# Patient Record
Sex: Female | Born: 1955 | Race: White | Hispanic: No | Smoking: Former smoker
Health system: Southern US, Community
[De-identification: ages and names within clinical notes are randomized; demographics above are authoritative.]

## PROBLEM LIST (undated history)

## (undated) DIAGNOSIS — I10 Essential (primary) hypertension: Secondary | ICD-10-CM

## (undated) DIAGNOSIS — I639 Cerebral infarction, unspecified: Secondary | ICD-10-CM

## (undated) DIAGNOSIS — I739 Peripheral vascular disease, unspecified: Secondary | ICD-10-CM

## (undated) DIAGNOSIS — R600 Localized edema: Secondary | ICD-10-CM

## (undated) DIAGNOSIS — I5032 Chronic diastolic (congestive) heart failure: Secondary | ICD-10-CM

## (undated) DIAGNOSIS — I4892 Unspecified atrial flutter: Secondary | ICD-10-CM

## (undated) DIAGNOSIS — R0600 Dyspnea, unspecified: Secondary | ICD-10-CM

## (undated) DIAGNOSIS — E119 Type 2 diabetes mellitus without complications: Secondary | ICD-10-CM

## (undated) DIAGNOSIS — I493 Ventricular premature depolarization: Secondary | ICD-10-CM

## (undated) DIAGNOSIS — R0609 Other forms of dyspnea: Secondary | ICD-10-CM

## (undated) DIAGNOSIS — C50919 Malignant neoplasm of unspecified site of unspecified female breast: Secondary | ICD-10-CM

## (undated) HISTORY — PX: LEG SURGERY: SHX1003

## (undated) HISTORY — PX: OTHER SURGICAL HISTORY: SHX169

## (undated) HISTORY — PX: MASTECTOMY: SHX3

## (undated) HISTORY — DX: Chronic diastolic (congestive) heart failure: I50.32

---

## 2009-09-04 ENCOUNTER — Ambulatory Visit: Payer: Self-pay | Admitting: Family Medicine

## 2011-05-02 ENCOUNTER — Inpatient Hospital Stay: Payer: Self-pay | Admitting: *Deleted

## 2014-04-04 ENCOUNTER — Other Ambulatory Visit: Payer: Self-pay | Admitting: Family Medicine

## 2014-04-04 ENCOUNTER — Ambulatory Visit: Payer: Self-pay | Admitting: Ophthalmology

## 2014-04-04 LAB — COMPREHENSIVE METABOLIC PANEL
ALBUMIN: 3.3 g/dL — AB (ref 3.4–5.0)
ANION GAP: 9 (ref 7–16)
Alkaline Phosphatase: 79 U/L
BUN: 32 mg/dL — AB (ref 7–18)
Bilirubin,Total: 0.3 mg/dL (ref 0.2–1.0)
Calcium, Total: 9.7 mg/dL (ref 8.5–10.1)
Chloride: 101 mmol/L (ref 98–107)
Co2: 26 mmol/L (ref 21–32)
Creatinine: 1.17 mg/dL (ref 0.60–1.30)
EGFR (Non-African Amer.): 51 — ABNORMAL LOW
GFR CALC AF AMER: 59 — AB
Glucose: 223 mg/dL — ABNORMAL HIGH (ref 65–99)
Osmolality: 286 (ref 275–301)
POTASSIUM: 4.3 mmol/L (ref 3.5–5.1)
SGOT(AST): 22 U/L (ref 15–37)
SGPT (ALT): 23 U/L
Sodium: 136 mmol/L (ref 136–145)
Total Protein: 8.2 g/dL (ref 6.4–8.2)

## 2014-04-04 LAB — HEMOGLOBIN A1C: HEMOGLOBIN A1C: 9.7 % — AB (ref 4.2–6.3)

## 2014-04-04 LAB — LIPID PANEL
CHOLESTEROL: 171 mg/dL (ref 0–200)
HDL Cholesterol: 31 mg/dL — ABNORMAL LOW (ref 40–60)
Ldl Cholesterol, Calc: 70 mg/dL (ref 0–100)
Triglycerides: 351 mg/dL — ABNORMAL HIGH (ref 0–200)
VLDL CHOLESTEROL, CALC: 70 mg/dL — AB (ref 5–40)

## 2014-04-04 LAB — TSH: Thyroid Stimulating Horm: 2.38 u[IU]/mL

## 2014-04-16 ENCOUNTER — Ambulatory Visit: Payer: Self-pay | Admitting: Ophthalmology

## 2014-12-21 NOTE — Op Note (Signed)
PATIENT NAME:  Megan Cisneros, Megan Cisneros MR#:  [id number] DATE OF BIRTH:  01-28-1956  DATE OF PROCEDURE:  04/16/2014  PREOPERATIVE DIAGNOSIS: Visually significant cataract of the right eye.   POSTOPERATIVE DIAGNOSIS: Visually significant cataract of the right eye.   OPERATIVE PROCEDURE: Cataract extraction by phacoemulsification with implant of intraocular lens to right eye.   SURGEON: Birder Robson, MD.   ANESTHESIA:  1. Managed anesthesia care.  2. Topical tetracaine drops followed by 2% Xylocaine jelly applied in the preoperative holding area.   COMPLICATIONS: None.   TECHNIQUE:  Stop and chop.  DESCRIPTION OF PROCEDURE: The patient was examined and consented in the preoperative holding area where the aforementioned topical anesthesia was applied to the right eye and then brought back to the Operating Room where the right eye was prepped and draped in the usual sterile ophthalmic fashion and a lid speculum was placed. A paracentesis was created with the side port blade and the anterior chamber was filled with viscoelastic. A near clear corneal incision was performed with the steel keratome. A continuous curvilinear capsulorrhexis was performed with a cystotome followed by the capsulorrhexis forceps. Hydrodissection and hydrodelineation were carried out with BSS on a blunt cannula. The lens was removed in a stop-and-chop technique and the remaining cortical material was removed with the irrigation-aspiration handpiece. The capsular bag was inflated with viscoelastic and the Tecnis ZCB00 20.5-diopter lens, serial number [id number] was placed in the capsular bag without complication. The remaining viscoelastic was removed from the eye with the irrigation-aspiration handpiece. The wounds were hydrated. The anterior chamber was flushed with Miostat and the eye was inflated to physiologic pressure. 0.1 mL of cefuroxime concentration 10 mg/mL was placed in the anterior chamber. The wounds were found to be  water tight. The eye was dressed with Vigamox. The patient was given protective glasses to wear throughout the day and a shield with which to sleep tonight. The patient was also given drops with which to begin a drop regimen today and will follow-up with me in one day.    ____________________________ Livingston Diones. Marquet Faircloth, MD wlp:TT D: 04/16/2014 20:32:54 ET T: 04/16/2014 21:45:41 ET JOB#: [id number]  cc: Quaran Kedzierski L. Shaconda Hajduk, MD, <Dictator> Livingston Diones Santiago Graf MD ELECTRONICALLY SIGNED 04/17/2014 8:51

## 2015-12-11 ENCOUNTER — Emergency Department: Payer: Medicare Other

## 2015-12-11 ENCOUNTER — Encounter: Payer: Self-pay | Admitting: Emergency Medicine

## 2015-12-11 ENCOUNTER — Inpatient Hospital Stay
Admission: EM | Admit: 2015-12-11 | Discharge: 2015-12-13 | DRG: 292 | Disposition: A | Discharge status: Home Health | Payer: Medicare Other | Attending: Internal Medicine | Admitting: Internal Medicine

## 2015-12-11 DIAGNOSIS — Z7901 Long term (current) use of anticoagulants: Secondary | ICD-10-CM | POA: Diagnosis not present

## 2015-12-11 DIAGNOSIS — E1165 Type 2 diabetes mellitus with hyperglycemia: Secondary | ICD-10-CM | POA: Diagnosis present

## 2015-12-11 DIAGNOSIS — G4730 Sleep apnea, unspecified: Secondary | ICD-10-CM | POA: Diagnosis present

## 2015-12-11 DIAGNOSIS — Z8673 Personal history of transient ischemic attack (TIA), and cerebral infarction without residual deficits: Secondary | ICD-10-CM

## 2015-12-11 DIAGNOSIS — R600 Localized edema: Secondary | ICD-10-CM | POA: Insufficient documentation

## 2015-12-11 DIAGNOSIS — Z6841 Body Mass Index (BMI) 40.0 and over, adult: Secondary | ICD-10-CM

## 2015-12-11 DIAGNOSIS — I4891 Unspecified atrial fibrillation: Secondary | ICD-10-CM | POA: Diagnosis present

## 2015-12-11 DIAGNOSIS — Z9119 Patient's noncompliance with other medical treatment and regimen: Secondary | ICD-10-CM

## 2015-12-11 DIAGNOSIS — I509 Heart failure, unspecified: Secondary | ICD-10-CM | POA: Diagnosis not present

## 2015-12-11 DIAGNOSIS — Z87891 Personal history of nicotine dependence: Secondary | ICD-10-CM | POA: Diagnosis not present

## 2015-12-11 DIAGNOSIS — I739 Peripheral vascular disease, unspecified: Secondary | ICD-10-CM | POA: Diagnosis present

## 2015-12-11 DIAGNOSIS — I5033 Acute on chronic diastolic (congestive) heart failure: Secondary | ICD-10-CM | POA: Diagnosis not present

## 2015-12-11 DIAGNOSIS — I110 Hypertensive heart disease with heart failure: Principal | ICD-10-CM | POA: Diagnosis present

## 2015-12-11 DIAGNOSIS — R0602 Shortness of breath: Secondary | ICD-10-CM

## 2015-12-11 DIAGNOSIS — I483 Typical atrial flutter: Secondary | ICD-10-CM | POA: Diagnosis not present

## 2015-12-11 DIAGNOSIS — I2510 Atherosclerotic heart disease of native coronary artery without angina pectoris: Secondary | ICD-10-CM | POA: Diagnosis present

## 2015-12-11 DIAGNOSIS — Z859 Personal history of malignant neoplasm, unspecified: Secondary | ICD-10-CM

## 2015-12-11 DIAGNOSIS — I4892 Unspecified atrial flutter: Secondary | ICD-10-CM | POA: Diagnosis present

## 2015-12-11 DIAGNOSIS — J449 Chronic obstructive pulmonary disease, unspecified: Secondary | ICD-10-CM | POA: Diagnosis present

## 2015-12-11 DIAGNOSIS — Z794 Long term (current) use of insulin: Secondary | ICD-10-CM

## 2015-12-11 HISTORY — DX: Morbid (severe) obesity due to excess calories: E66.01

## 2015-12-11 HISTORY — DX: Dyspnea, unspecified: R06.00

## 2015-12-11 HISTORY — DX: Type 2 diabetes mellitus without complications: E11.9

## 2015-12-11 HISTORY — DX: Cerebral infarction, unspecified: I63.9

## 2015-12-11 HISTORY — DX: Essential (primary) hypertension: I10

## 2015-12-11 HISTORY — DX: Unspecified atrial flutter: I48.92

## 2015-12-11 HISTORY — DX: Other forms of dyspnea: R06.09

## 2015-12-11 HISTORY — DX: Peripheral vascular disease, unspecified: I73.9

## 2015-12-11 HISTORY — DX: Ventricular premature depolarization: I49.3

## 2015-12-11 LAB — BRAIN NATRIURETIC PEPTIDE: B NATRIURETIC PEPTIDE 5: 362 pg/mL — AB (ref 0.0–100.0)

## 2015-12-11 LAB — CBC WITH DIFFERENTIAL/PLATELET
BASOS PCT: 1 %
Basophils Absolute: 0.1 10*3/uL (ref 0–0.1)
EOS ABS: 0.9 10*3/uL — AB (ref 0–0.7)
EOS PCT: 9 %
HEMATOCRIT: 39.5 % (ref 35.0–47.0)
Hemoglobin: 13.2 g/dL (ref 12.0–16.0)
Lymphocytes Relative: 25 %
Lymphs Abs: 2.5 10*3/uL (ref 1.0–3.6)
MCH: 28.4 pg (ref 26.0–34.0)
MCHC: 33.4 g/dL (ref 32.0–36.0)
MCV: 85 fL (ref 80.0–100.0)
MONO ABS: 0.8 10*3/uL (ref 0.2–0.9)
MONOS PCT: 8 %
NEUTROS ABS: 5.9 10*3/uL (ref 1.4–6.5)
Neutrophils Relative %: 57 %
PLATELETS: 305 10*3/uL (ref 150–440)
RBC: 4.64 MIL/uL (ref 3.80–5.20)
RDW: 16.7 % — AB (ref 11.5–14.5)
WBC: 10.1 10*3/uL (ref 3.6–11.0)

## 2015-12-11 LAB — BASIC METABOLIC PANEL
Anion gap: 8 (ref 5–15)
BUN: 39 mg/dL — ABNORMAL HIGH (ref 6–20)
CALCIUM: 9.2 mg/dL (ref 8.9–10.3)
CO2: 24 mmol/L (ref 22–32)
Chloride: 103 mmol/L (ref 101–111)
Creatinine, Ser: 1.03 mg/dL — ABNORMAL HIGH (ref 0.44–1.00)
GFR, EST NON AFRICAN AMERICAN: 57 mL/min — AB (ref >60)
GLUCOSE: 241 mg/dL — AB (ref 65–99)
Potassium: 4.1 mmol/L (ref 3.5–5.1)
Sodium: 135 mmol/L (ref 135–145)

## 2015-12-11 LAB — TROPONIN I: Troponin I: <0.03 ng/mL (ref <0.031)

## 2015-12-11 MED ORDER — ENOXAPARIN SODIUM 100 MG/ML ~~LOC~~ SOLN
1.0000 mg/kg | Freq: Two times a day (BID) | SUBCUTANEOUS | Status: DC
  Administered 2015-12-11 – 2015-12-12 (×2): 100 mg via SUBCUTANEOUS
  Filled 2015-12-11 (×2): qty 1

## 2015-12-11 MED ORDER — FUROSEMIDE 10 MG/ML IJ SOLN
40.0000 mg | Freq: Once | INTRAMUSCULAR | Status: AC
  Administered 2015-12-11: 40 mg via INTRAVENOUS
  Filled 2015-12-11: qty 4

## 2015-12-11 MED ORDER — LEVOFLOXACIN IN D5W 750 MG/150ML IV SOLN
750.0000 mg | Freq: Once | INTRAVENOUS | Status: DC
  Filled 2015-12-11: qty 150

## 2015-12-11 MED ORDER — ALBUTEROL SULFATE (2.5 MG/3ML) 0.083% IN NEBU
5.0000 mg | INHALATION_SOLUTION | Freq: Once | RESPIRATORY_TRACT | Status: AC
  Administered 2015-12-11: 5 mg via RESPIRATORY_TRACT
  Filled 2015-12-11: qty 6

## 2015-12-11 NOTE — ED Provider Notes (Signed)
[facility] Emergency Department Provider Note    ____________________________________________  Time seen: ~1900  I have reviewed the triage vital signs and the nursing notes.   HISTORY  Chief Complaint Respiratory Distress   History limited by: Not Limited   HPI Megan Cisneros is a 60 y.o. female who presents to the emergency department today because of concerns for shortness of breath. The patient states that it started 2 days ago. It has progressively gotten worse. The patient states it is worse when she lies flat. She has had a dry nonproductive cough associated with this. She denies any significant chest pain. She denies any fevers.    Past Medical History  Diagnosis Date  . Coronary artery disease   . Diabetes mellitus without complication ([place])   . Hypertension   . Cancer ([place])   . Stroke [facility])     There are no active problems to display for this patient.   Past Surgical History  Procedure Laterality Date  . Leg surgery    . Cesarean section      No current outpatient prescriptions on file.  Allergies Review of patient's allergies indicates no known allergies.  No family history on file.  Social History Social History  Substance Use Topics  . Smoking status: Former Research scientist (life sciences)  . Smokeless tobacco: Not on file  . Alcohol Use: No    Review of Systems  Constitutional: Negative for fever. Cardiovascular: Negative for chest pain. Respiratory:Positive for chest pain and cough Gastrointestinal: Negative for abdominal pain, vomiting and diarrhea. Neurological: Negative for headaches, focal weakness or numbness.  10-point ROS otherwise negative.  ____________________________________________   PHYSICAL EXAM:  VITAL SIGNS: ED Triage Vitals  Enc Vitals Group     BP 12/11/15 1820 157/70 mmHg     Pulse Rate 12/11/15 1820 64     Resp 12/11/15 1820 18     Temp 12/11/15 1820 98.2 F (36.8 C)     Temp Source 12/11/15 1820 Oral      SpO2 12/11/15 1820 94 %     Weight 12/11/15 1820 219 lb (99.338 kg)     Height 12/11/15 1820 5' (1.524 m)   Constitutional: Alert and oriented. Well appearing and in no distress. Eyes: Conjunctivae are normal. PERRL. Normal extraocular movements. ENT   Head: Normocephalic and atraumatic.   Nose: No congestion/rhinnorhea.   Mouth/Throat: Mucous membranes are moist.   Neck: No stridor. Hematological/Lymphatic/Immunilogical: No cervical lymphadenopathy. Cardiovascular: Normal rate, regular rhythm.  No murmurs, rubs, or gallops. Respiratory: Normal respiratory effort without tachypnea nor retractions. Breath sounds are clear and equal bilaterally. No wheezes/rales/rhonchi. Gastrointestinal: Soft and nontender. No distention.  Genitourinary: Deferred Musculoskeletal: Normal range of motion in all extremities. No joint effusions.  Bilateral lower extremity edema, nonpitting. Neurologic:  Normal speech and language. No gross focal neurologic deficits are appreciated.  Skin:  Skin is warm, dry and intact. No rash noted. Psychiatric: Mood and affect are normal. Speech and behavior are normal. Patient exhibits appropriate insight and judgment.  ____________________________________________    LABS (pertinent positives/negatives)  Labs Reviewed  CBC WITH DIFFERENTIAL/PLATELET - Abnormal; Notable for the following:    RDW 16.7 (*)    Eosinophils Absolute 0.9 (*)    All other components within normal limits  BASIC METABOLIC PANEL - Abnormal; Notable for the following:    Glucose, Bld 241 (*)    BUN 39 (*)    Creatinine, Ser 1.03 (*)    GFR calc non Af Amer 57 (*)  All other components within normal limits  BRAIN NATRIURETIC PEPTIDE - Abnormal; Notable for the following:    B Natriuretic Peptide 362.0 (*)    All other components within normal limits  CULTURE, BLOOD (ROUTINE X 2)  CULTURE, BLOOD (ROUTINE X 2)  TROPONIN I      ____________________________________________   EKG  I, Nance Pear, attending physician, personally viewed and interpreted this EKG  EKG Time: 1817 Rate: 89 Rhythm: atrial fibrillation w/ PVC Axis: left axis deviation Intervals: qtc 469 QRS: narrow, q waves V1, V2 ST changes: no st elevation Impression: abnormal ekg  ____________________________________________    RADIOLOGY  CXR  IMPRESSION: Patchy bilateral lung opacity primarily concerning for pneumonia. Small pleural effusions without pulmonary edema.  ____________________________________________   PROCEDURES  Procedure(s) performed: None  Critical Care performed: No  ____________________________________________   INITIAL IMPRESSION / ASSESSMENT AND PLAN / ED COURSE  Pertinent labs & imaging results that were available during my care of the patient were reviewed by me and considered in my medical decision making (see chart for details).  Patient presents to the emergency department today with 2 days of increased respiratory distress. Orthopnea. Patient's x-ray showed some patchy bilateral opacities. This point I find pneumonia less likely given lack of fever, leukocytosis or productive cough. Wonder if patient does have a new onset CHF. Additionally patient's EKG shows atrial fibrillation. Patient has no history of this. Given the new concern for heart failure as well as atrial fibrillation will plan on admission to the hospital service.  ____________________________________________   FINAL CLINICAL IMPRESSION(S) / ED DIAGNOSES  Final diagnoses:  Atrial fibrillation, unspecified type ([place])  Shortness of breath     Nance Pear, MD 12/11/15 2341

## 2015-12-11 NOTE — ED Notes (Signed)
x2 days increased respiratory distress, cough, fatigue, difficulty lying flat at night

## 2015-12-11 NOTE — H&P (Signed)
[place] [facility] [place] NAME: Megan Cisneros    MR#:  [id number]  DATE OF BIRTH:  [birth year]  DATE OF ADMISSION:  12/11/2015  PRIMARY CARE PHYSICIAN: Donnie Coffin, MD   REQUESTING/REFERRING PHYSICIAN: Dr. Archie Balboa  CHIEF COMPLAINT:   Increasing shortness of breath and leg swelling for 3-4 days HISTORY OF PRESENT ILLNESS:  Megan Cisneros  is a 60 y.o. female with a known history of type 2 diabetes on insulin, morbid obesity, hypertension, History of TIA/CVA comes to the emergency room with increasing shortness of breath and leg edema PND and orthopnea for the last several days. Patient reports getting very short of breath and fatigued walking around the house. She has been having difficulty laying down flat has been waking up in the middle of the night gasping for air for last several days. She denies any history of congestive heart failure in the past. She does have history of CAD how where she does not know details about it. Patient was found to be clinically in congestive heart failure acute onset EF unknown she is being admitted for further evaluation and management. Her first set of troponin is negative. Received Lasix 40 mg in the emergency room. PAST MEDICAL HISTORY:   Past Medical History  Diagnosis Date  . Coronary artery disease   . Diabetes mellitus without complication ([place])   . Hypertension   . Cancer ([place])   . Stroke [facility])     PAST SURGICAL HISTOIRY:   Past Surgical History  Procedure Laterality Date  . Leg surgery    . Cesarean section      SOCIAL HISTORY:   Social History  Substance Use Topics  . Smoking status: Former Research scientist (life sciences)  . Smokeless tobacco: Not on file  . Alcohol Use: No    FAMILY HISTORY:  No family history on file.  DRUG ALLERGIES:  No Known Allergies  REVIEW OF SYSTEMS:  Review of Systems  Constitutional: Negative for fever, chills and weight loss.  HENT: Negative for ear discharge, ear pain  and nosebleeds.   Eyes: Negative for blurred vision, pain and discharge.  Respiratory: Positive for shortness of breath and wheezing. Negative for sputum production and stridor.   Cardiovascular: Positive for orthopnea, leg swelling and PND. Negative for chest pain and palpitations.  Gastrointestinal: Negative for nausea, vomiting, abdominal pain and diarrhea.  Genitourinary: Negative for urgency and frequency.  Musculoskeletal: Negative for back pain and joint pain.  Neurological: Positive for weakness. Negative for sensory change, speech change and focal weakness.  Psychiatric/Behavioral: Negative for depression and hallucinations. The patient is not nervous/anxious.   All other systems reviewed and are negative.    MEDICATIONS AT HOME:   Prior to Admission medications   Medication Sig Start Date End Date Taking? Authorizing Provider  amLODipine (NORVASC) 10 MG tablet Take 10 mg by mouth daily.   Yes Historical Provider, MD  atenolol-chlorthalidone (TENORETIC) 50-25 MG tablet Take 1 tablet by mouth daily.   Yes Historical Provider, MD  dipyridamole-aspirin (AGGRENOX) 200-25 MG 12hr capsule Take 1 capsule by mouth 2 (two) times daily.   Yes Historical Provider, MD  glipiZIDE (GLUCOTROL XL) 10 MG 24 hr tablet Take 10 mg by mouth daily as needed. Patient takes with levemir when blood sugar is high   Yes Historical Provider, MD  insulin aspart (NOVOLOG) 100 UNIT/ML injection Inject 3 Units into the skin 3 (three) times daily before meals.   Yes Historical Provider, MD  insulin detemir (  LEVEMIR) 100 UNIT/ML injection Inject 30 Units into the skin at bedtime.   Yes Historical Provider, MD  lisinopril (PRINIVIL,ZESTRIL) 40 MG tablet Take 40 mg by mouth daily.   Yes Historical Provider, MD  metFORMIN (GLUCOPHAGE) 500 MG tablet Take 1,000 mg by mouth 2 (two) times daily.    Yes Historical Provider, MD  Multiple Vitamin (MULTIVITAMIN WITH MINERALS) TABS tablet Take 1 tablet by mouth daily.   Yes  Historical Provider, MD  omeprazole (PRILOSEC) 20 MG capsule Take 20 mg by mouth daily.   Yes Historical Provider, MD  oxybutynin (DITROPAN) 5 MG tablet Take 5 mg by mouth 2 (two) times daily.   Yes Historical Provider, MD  simvastatin (ZOCOR) 40 MG tablet Take 40 mg by mouth daily.   Yes Historical Provider, MD  spironolactone (ALDACTONE) 25 MG tablet Take 25 mg by mouth daily.   Yes Historical Provider, MD      VITAL SIGNS:  Blood pressure 164/56, pulse 50, temperature 98.2 F (36.8 C), temperature source Oral, resp. rate 18, height 5' (1.524 m), weight 99.338 kg (219 lb), SpO2 97 %.  PHYSICAL EXAMINATION:  GENERAL:  60 y.o.-year-old patient lying in the bed with no acute distress. Morbid obesity EYES: Pupils equal, round, reactive to light and accommodation. No scleral icterus. Extraocular muscles intact.  HEENT: Head atraumatic, normocephalic. Oropharynx and nasopharynx clear.  NECK:  Supple, no jugular venous distention. No thyroid enlargement, no tenderness.  LUNGS: Normal breath sounds bilaterally, no wheezing, rales,rhonchi or crepitation. No use of accessory muscles of respiration. Decreased breath sounds bilaterally CARDIOVASCULAR: S1, S2 normal. No murmurs, rubs, or gallops. Irregularly irregular heart rhythm ABDOMEN: Soft, nontender, nondistended. Bowel sounds present. No organomegaly or mass.  EXTREMITIES: 2+ pedal edema, no cyanosis, or clubbing. Signs of chronic venous stasis of the lower extremity NEUROLOGIC: Cranial nerves II through XII are intact. Muscle strength 5/5 in all extremities. Sensation intact. Gait not checked.  PSYCHIATRIC: The patient is alert and oriented x 3.  SKIN: No obvious rash, lesion, or ulcer.   LABORATORY PANEL:   CBC  Recent Labs Lab 12/11/15 1943  WBC 10.1  HGB 13.2  HCT 39.5  PLT 305   ------------------------------------------------------------------------------------------------------------------  Chemistries   Recent Labs Lab  12/11/15 1943  NA 135  K 4.1  CL 103  CO2 24  GLUCOSE 241*  BUN 39*  CREATININE 1.03*  CALCIUM 9.2   ------------------------------------------------------------------------------------------------------------------  Cardiac Enzymes  Recent Labs Lab 12/11/15 1943  TROPONINI <0.03   ------------------------------------------------------------------------------------------------------------------  RADIOLOGY:  Dg Chest 2 View  12/11/2015  CLINICAL DATA:  Difficulty breathing for 2 days EXAM: CHEST  2 VIEW COMPARISON:  None. FINDINGS: Small pleural effusions. There is a indistinct opacity above the right minor fissure. Patchy opacity also seen at the left lower lobe over the diaphragm. No overt edema. Borderline cardiomegaly. Negative aortic and hilar contours. IMPRESSION: Patchy bilateral lung opacity primarily concerning for pneumonia. Small pleural effusions without pulmonary edema. Electronically Signed   By: Monte Fantasia M.D.   On: 12/11/2015 19:12    EKG:  Atrial flutter/atrial fibrillation heart rate in the 21s No old EKG for comparison  IMPRESSION AND PLAN:   Britton Perkinson  is a 60 y.o. female with a known history of type 2 diabetes on insulin, morbid obesity, hypertension, History of TIA/CVA comes to the emergency room with increasing shortness of breath and leg edema PND and orthopnea for the last several days. Patient reports getting very short of breath and fatigued walking around the house.  She has been having difficulty laying down flat has been waking up in the middle of the night gasping for air for last several days.   1. Acute congestive heart failure,, EF unknown -Admit to telemetry -IV Lasix 20 mg 3 times a day -Monitor creatinine, monitor I's and O's, -Cardiology consultation -Cycle cardiac enzymes 3, echo -Dietary consultation -Chest x-ray consistent with congestive heart failure. Patient has elevated BNP leg edema. As of breath PND and orthopnea which  favor CHF. No signs and symptoms suggestive pneumonia.  2. New onset atrial/ flutter fibrillation -Heart rate under control -We'll start patient on Lovenox 1 mg/kg twice a day. Echo ordered. -Defer long-term anticoagulation with cardiology  3. Type 2 diabetes uncontrolled, A noncompliance. Patient reports eating a lot of carbs Continue home dose insulin adjustment according to her sugars Dietary consultation  4. Possible sleep apnea Get overnight pulse oximetry to see patient at least qualifies for home oxygen at nighttime Outpatient sleep study recommended  5. Morbid obesity  6. DVT prophylaxis on treatment dose of Lovenox    All the records are reviewed and case discussed with ED provider. Management plans discussed with the patient, family and they are in agreement.  CODE STATUS: Full  TOTAL TIME TAKING CARE OF THIS PATIENT: 55 minutes.    Zury Fazzino M.D on 12/11/2015 at 10:32 PM  Between 7am to 6pm - Pager - [phone]  After 6pm go to [email or web address] - password [email or web address] [facility]  Office  [phone]  CC: Primary care physician; Donnie Coffin, MD

## 2015-12-11 NOTE — ED Notes (Signed)
Unsuccessful IV start x2, asking Musician to try

## 2015-12-12 ENCOUNTER — Encounter: Payer: Self-pay | Admitting: Emergency Medicine

## 2015-12-12 ENCOUNTER — Inpatient Hospital Stay (HOSPITAL_COMMUNITY)
Admit: 2015-12-12 | Discharge: 2015-12-12 | Disposition: A | Discharge status: Home / Self Care | Payer: Medicare Other | Attending: Internal Medicine | Admitting: Internal Medicine

## 2015-12-12 DIAGNOSIS — I483 Typical atrial flutter: Secondary | ICD-10-CM

## 2015-12-12 DIAGNOSIS — R0602 Shortness of breath: Secondary | ICD-10-CM | POA: Insufficient documentation

## 2015-12-12 DIAGNOSIS — R600 Localized edema: Secondary | ICD-10-CM

## 2015-12-12 DIAGNOSIS — I509 Heart failure, unspecified: Secondary | ICD-10-CM

## 2015-12-12 LAB — GLUCOSE, CAPILLARY
GLUCOSE-CAPILLARY: 311 mg/dL — AB (ref 65–99)
GLUCOSE-CAPILLARY: 191 mg/dL — AB (ref 65–99)
GLUCOSE-CAPILLARY: 114 mg/dL — AB (ref 65–99)
Glucose-Capillary: 248 mg/dL — ABNORMAL HIGH (ref 65–99)
Glucose-Capillary: 151 mg/dL — ABNORMAL HIGH (ref 65–99)

## 2015-12-12 LAB — TROPONIN I: Troponin I: <0.03 ng/mL (ref <0.031)

## 2015-12-12 LAB — BASIC METABOLIC PANEL
Anion gap: 8 (ref 5–15)
BUN: 40 mg/dL — ABNORMAL HIGH (ref 6–20)
CHLORIDE: 103 mmol/L (ref 101–111)
CO2: 24 mmol/L (ref 22–32)
CREATININE: 1.2 mg/dL — AB (ref 0.44–1.00)
Calcium: 9.2 mg/dL (ref 8.9–10.3)
GFR calc non Af Amer: 48 mL/min — ABNORMAL LOW (ref >60)
GFR, EST AFRICAN AMERICAN: 55 mL/min — AB (ref >60)
GLUCOSE: 318 mg/dL — AB (ref 65–99)
Potassium: 4 mmol/L (ref 3.5–5.1)
Sodium: 135 mmol/L (ref 135–145)

## 2015-12-12 LAB — ECHOCARDIOGRAM COMPLETE
HEIGHTINCHES: 60 in
WEIGHTICAEL: 3515.2 [oz_av]

## 2015-12-12 LAB — TSH: TSH: 3.108 u[IU]/mL (ref 0.350–4.500)

## 2015-12-12 LAB — HEMOGLOBIN A1C: Hgb A1c MFr Bld: 9.2 % — ABNORMAL HIGH (ref 4.0–6.0)

## 2015-12-12 MED ORDER — SODIUM CHLORIDE 0.9% FLUSH
3.0000 mL | Freq: Two times a day (BID) | INTRAVENOUS | Status: DC
  Administered 2015-12-12 – 2015-12-13 (×3): 3 mL via INTRAVENOUS

## 2015-12-12 MED ORDER — SIMVASTATIN 40 MG PO TABS
40.0000 mg | ORAL_TABLET | Freq: Once | ORAL | Status: AC
  Administered 2015-12-12: 40 mg via ORAL
  Filled 2015-12-12 (×2): qty 1

## 2015-12-12 MED ORDER — ONDANSETRON HCL 4 MG/2ML IJ SOLN: 4.0000 mg | Freq: Four times a day (QID) | INTRAMUSCULAR | Status: DC | PRN

## 2015-12-12 MED ORDER — FUROSEMIDE 10 MG/ML IJ SOLN
40.0000 mg | Freq: Three times a day (TID) | INTRAMUSCULAR | Status: DC
Start: 2015-12-12 — End: 2015-12-12

## 2015-12-12 MED ORDER — INSULIN DETEMIR 100 UNIT/ML ~~LOC~~ SOLN
30.0000 [IU] | Freq: Every day | SUBCUTANEOUS | Status: DC
  Administered 2015-12-12: 30 [IU] via SUBCUTANEOUS
  Filled 2015-12-12 (×2): qty 0.3

## 2015-12-12 MED ORDER — INSULIN ASPART 100 UNIT/ML ~~LOC~~ SOLN
3.0000 [IU] | Freq: Three times a day (TID) | SUBCUTANEOUS | Status: DC
  Administered 2015-12-12 – 2015-12-13 (×4): 3 [IU] via SUBCUTANEOUS
  Filled 2015-12-12 (×4): qty 3

## 2015-12-12 MED ORDER — ADULT MULTIVITAMIN W/MINERALS CH
1.0000 | ORAL_TABLET | Freq: Every day | ORAL | Status: DC
  Administered 2015-12-12: 1 via ORAL
  Filled 2015-12-12: qty 1

## 2015-12-12 MED ORDER — APIXABAN 5 MG PO TABS: 5.0000 mg | ORAL_TABLET | Freq: Two times a day (BID) | ORAL | Status: DC

## 2015-12-12 MED ORDER — OXYBUTYNIN CHLORIDE 5 MG PO TABS
5.0000 mg | ORAL_TABLET | Freq: Two times a day (BID) | ORAL | Status: DC
Start: 2015-12-12 — End: 2015-12-13
  Administered 2015-12-12 – 2015-12-13 (×3): 5 mg via ORAL
  Filled 2015-12-12 (×3): qty 1

## 2015-12-12 MED ORDER — METFORMIN HCL 500 MG PO TABS
1000.0000 mg | ORAL_TABLET | Freq: Two times a day (BID) | ORAL | Status: DC
  Administered 2015-12-12 – 2015-12-13 (×2): 1000 mg via ORAL
  Filled 2015-12-12 (×3): qty 2

## 2015-12-12 MED ORDER — SPIRONOLACTONE 25 MG PO TABS
25.0000 mg | ORAL_TABLET | Freq: Every day | ORAL | Status: DC
  Administered 2015-12-12 – 2015-12-13 (×2): 25 mg via ORAL
  Filled 2015-12-12 (×2): qty 1

## 2015-12-12 MED ORDER — SODIUM CHLORIDE 0.9 % IV SOLN: 250.0000 mL | INTRAVENOUS | Status: DC | PRN

## 2015-12-12 MED ORDER — ADULT MULTIVITAMIN W/MINERALS CH: 1.0000 | ORAL_TABLET | Freq: Every day | ORAL | Status: DC

## 2015-12-12 MED ORDER — GLIPIZIDE ER 2.5 MG PO TB24
10.0000 mg | ORAL_TABLET | Freq: Every day | ORAL | Status: DC
  Filled 2015-12-12: qty 4

## 2015-12-12 MED ORDER — METFORMIN HCL 500 MG PO TABS: 1000.0000 mg | ORAL_TABLET | Freq: Two times a day (BID) | ORAL | Status: DC

## 2015-12-12 MED ORDER — FUROSEMIDE 40 MG PO TABS
40.0000 mg | ORAL_TABLET | Freq: Two times a day (BID) | ORAL | Status: DC
  Administered 2015-12-12: 40 mg via ORAL
  Filled 2015-12-12: qty 1

## 2015-12-12 MED ORDER — ALBUTEROL SULFATE HFA 108 (90 BASE) MCG/ACT IN AERS: 2.0000 | INHALATION_SPRAY | Freq: Four times a day (QID) | RESPIRATORY_TRACT | Status: DC | PRN

## 2015-12-12 MED ORDER — GLIPIZIDE ER 10 MG PO TB24
10.0000 mg | ORAL_TABLET | Freq: Every day | ORAL | Status: DC
  Filled 2015-12-12 (×2): qty 1

## 2015-12-12 MED ORDER — LISINOPRIL 20 MG PO TABS
40.0000 mg | ORAL_TABLET | Freq: Every day | ORAL | Status: DC
  Administered 2015-12-12 – 2015-12-13 (×2): 40 mg via ORAL
  Filled 2015-12-12 (×2): qty 2

## 2015-12-12 MED ORDER — FUROSEMIDE 40 MG PO TABS: 40.0000 mg | ORAL_TABLET | Freq: Two times a day (BID) | ORAL | Status: DC

## 2015-12-12 MED ORDER — INSULIN DETEMIR 100 UNIT/ML ~~LOC~~ SOLN
30.0000 [IU] | Freq: Every day | SUBCUTANEOUS | Status: DC
  Filled 2015-12-12: qty 0.3

## 2015-12-12 MED ORDER — ATENOLOL 25 MG PO TABS
50.0000 mg | ORAL_TABLET | Freq: Every day | ORAL | Status: DC
  Administered 2015-12-12 – 2015-12-13 (×2): 50 mg via ORAL
  Filled 2015-12-12 (×2): qty 2

## 2015-12-12 MED ORDER — ACETAMINOPHEN 325 MG PO TABS: 650.0000 mg | ORAL_TABLET | ORAL | Status: DC | PRN

## 2015-12-12 MED ORDER — SIMVASTATIN 40 MG PO TABS
40.0000 mg | ORAL_TABLET | Freq: Every day | ORAL | Status: DC
  Administered 2015-12-12: 40 mg via ORAL
  Filled 2015-12-12: qty 1

## 2015-12-12 MED ORDER — SODIUM CHLORIDE 0.9% FLUSH: 3.0000 mL | INTRAVENOUS | Status: DC | PRN

## 2015-12-12 MED ORDER — PANTOPRAZOLE SODIUM 40 MG PO TBEC: 40.0000 mg | DELAYED_RELEASE_TABLET | Freq: Every day | ORAL | Status: DC

## 2015-12-12 MED ORDER — CHLORTHALIDONE 25 MG PO TABS
25.0000 mg | ORAL_TABLET | Freq: Every day | ORAL | Status: DC
Start: 2015-12-12 — End: 2015-12-12
  Administered 2015-12-12: 25 mg via ORAL
  Filled 2015-12-12: qty 1

## 2015-12-12 MED ORDER — ATENOLOL-CHLORTHALIDONE 50-25 MG PO TABS: 1.0000 | ORAL_TABLET | Freq: Every day | ORAL | Status: DC

## 2015-12-12 MED ORDER — INSULIN ASPART 100 UNIT/ML ~~LOC~~ SOLN
0.0000 [IU] | Freq: Three times a day (TID) | SUBCUTANEOUS | Status: DC
  Administered 2015-12-12: 3 [IU] via SUBCUTANEOUS
  Administered 2015-12-12: 2 [IU] via SUBCUTANEOUS
  Filled 2015-12-12: qty 2
  Filled 2015-12-12: qty 3

## 2015-12-12 MED ORDER — ASPIRIN-DIPYRIDAMOLE ER 25-200 MG PO CP12
1.0000 | ORAL_CAPSULE | Freq: Two times a day (BID) | ORAL | Status: DC
  Administered 2015-12-12: 1 via ORAL
  Filled 2015-12-12 (×3): qty 1

## 2015-12-12 MED ORDER — ATENOLOL 25 MG PO TABS: 12.5000 mg | ORAL_TABLET | Freq: Two times a day (BID) | ORAL | Status: DC

## 2015-12-12 MED ORDER — FUROSEMIDE 10 MG/ML IJ SOLN
20.0000 mg | Freq: Three times a day (TID) | INTRAMUSCULAR | Status: DC
  Administered 2015-12-12: 20 mg via INTRAVENOUS
  Filled 2015-12-12: qty 2

## 2015-12-12 MED ORDER — APIXABAN 5 MG PO TABS
5.0000 mg | ORAL_TABLET | Freq: Two times a day (BID) | ORAL | Status: DC
  Administered 2015-12-12 – 2015-12-13 (×2): 5 mg via ORAL
  Filled 2015-12-12 (×2): qty 1

## 2015-12-12 MED ORDER — SIMVASTATIN 40 MG PO TABS: 40.0000 mg | ORAL_TABLET | Freq: Every day | ORAL | Status: DC

## 2015-12-12 MED ORDER — PANTOPRAZOLE SODIUM 40 MG PO TBEC
40.0000 mg | DELAYED_RELEASE_TABLET | Freq: Every day | ORAL | Status: DC
  Administered 2015-12-12 – 2015-12-13 (×2): 40 mg via ORAL
  Filled 2015-12-12 (×2): qty 1

## 2015-12-12 NOTE — Consult Note (Signed)
Cardiology Consult    Patient ID: Megan Cisneros MRN: [id number], DOB/AGE: Jan 27, 1956   Admit date: 12/11/2015 Date of Consult: 12/12/2015  Primary Physician: Donnie Coffin, MD Primary Cardiologist: new - seen by Johnny Bridge, MD  Requesting Provider: Chauncey Cruel. Sudini, MD  Patient Profile    60 y/o ? with a h/o HTN, DM, and stroke, whom we've been asked to eval 2/2 recent worsening of dyspnea and edema and new finding of afib/flutter.  Past Medical History   Past Medical History  Diagnosis Date  . PVD (peripheral vascular disease) ([place])     a. 2004 s/p RLE bypass - complicated by recurrent infection.  . Diabetes mellitus without complication ([place])     a. Dx as borderline diabetic in the 80's-->progressed over the years.  . Essential hypertension   . Cancer ([place])   . Stroke [facility])     a. TIA x 2;  b. Stroke in 2007 in [place].  . Chronic Dyspnea on Exertion     a. 2004 Stress test prior to LE bypass->reportedly nl.  . Morbid obesity [facility])     Past Surgical History  Procedure Laterality Date  . Leg surgery    . Cesarean section    . Cataract surgery Right     03/2014    Allergies  No Known Allergies  History of Present Illness    60 y/o ? with the above complex PMH.  She has a h/o DM and HTN dating back to the 58's.  She has suffered two TIA's and a stroke (2007 - [place]). She is also s/p peripheral arterial bypass on the RLE, though details on this aren't clear as it was performed in [place], [place] in 2004.  Since then, she has had chronic RLE edema.  She lives locally by herself but has family nearby that help her out, as she is very sedentary.  She has chronic DOE but has never had c/p.  She has undergone stress testing before, prior to her LE bypass in 2004, and it was reportedly nl.  She was in her usual state of health until Tuesday 4/11, when she began noting significantly worsened DOE and also worsening of her lower ext edema.  DOE persisted throughout the day 4/11 and she noted  worsened orthopnea that night (she typically sleeps on 5-6 pillows @ baseline).  Ss persisted throughout the day on 4/12 and 4/13, and her family brought her into [facility] ED on the afternoon of 4/13.  Here, her BNP was mildly elevated @ 362.  CXR showed patchy bilateral lung opacities, concerning for pna along with small pl effusions.  She was also noted to be in atrial flutter, which was rate-controlled in the 80's.  She was treated with IV lasix with good response and some improvement in breathing.  We've been asked to eval. She remains in AF/Flutter and denies any current or prior h/o palpitations (she was in sinus on ECG in 03/2014).  She continues to experience dyspnea with minimal exertion in her room/bathroom.  Flutter is rate-controlled on tele, though she does have freq pvc's.  Inpatient Medications    . chlorthalidone  25 mg Oral Daily   And  . atenolol  50 mg Oral Daily  . dipyridamole-aspirin  1 capsule Oral BID  . enoxaparin (LOVENOX) injection  1 mg/kg Subcutaneous Q12H  . furosemide  40 mg Intravenous 3 times per day  . glipiZIDE  10 mg Oral Q supper  . insulin aspart  0-9 Units Subcutaneous  TID WC  . insulin aspart  3 Units Subcutaneous TID AC  . insulin detemir  30 Units Subcutaneous Q supper  . lisinopril  40 mg Oral Daily  . metFORMIN  1,000 mg Oral BID WC  . multivitamin with minerals  1 tablet Oral Q supper  . oxybutynin  5 mg Oral BID  . pantoprazole  40 mg Oral QAC breakfast  . simvastatin  40 mg Oral q1800  . sodium chloride flush  3 mL Intravenous Q12H  . spironolactone  25 mg Oral Daily    Family History    Family History  Problem Relation Age of Onset  . Heart attack Father     died in his early 34's.  . Cancer Mother     deceased  . Cancer Brother     multiple cancers, deceased.    Social History    Social History   Social History  . Marital Status: Widowed    Spouse Name: N/A  . Number of Children: N/A  . Years of Education: N/A    Occupational History  . Not on file.   Social History Main Topics  . Smoking status: Former Research scientist (life sciences)  . Smokeless tobacco: Not on file  . Alcohol Use: No  . Drug Use: Not on file  . Sexual Activity: Not on file   Other Topics Concern  . Not on file   Social History Narrative   Lives in [place] by herself.  Originally from [place], [place], later moved to [place] and then [place].  Has been in [place] for 8 yrs.  Family nearby.  Does not routinely exercise.     Review of Systems    General:  No chills, fever, night sweats or weight changes.  Cardiovascular:  No chest pain, +++ dyspnea on exertion, +++ chronic R>L LE edema, +++ chronic 5-6 pillow orthopnea, no palpitations, paroxysmal nocturnal dyspnea. Dermatological: No rash, lesions/masses Respiratory: No cough, +++ acute on chronic dyspnea Urologic: No hematuria, dysuria Abdominal:   No nausea, vomiting, diarrhea, bright red blood per rectum, melena, or hematemesis Neurologic:  No visual changes, wkns, changes in mental status. All other systems reviewed and are otherwise negative except as noted above.  Physical Exam    Blood pressure 136/77, pulse 44, temperature 97.6 F (36.4 C), temperature source Oral, resp. rate 18, height 5' (1.524 m), weight 219 lb 11.2 oz (99.655 kg), SpO2 98 %.  General: Pleasant, NAD Psych: Normal affect. Neuro: Alert and oriented X 3. Moves all extremities spontaneously. HEENT: Normal  Neck: Supple without bruits.  Obese - difficult to gauge JVP 2/2 girth. Lungs:  Resp regular and unlabored, CTA. Heart: IR, IR, no s3, s4, or murmurs. Abdomen: Obese, soft, non-tender, non-distended, BS + x 4.  Extremities: No clubbing, cyanosis.  Reddening of bilat ankles/shins with 2+ RLE edema and 1+ LLE edema. DP/PT/Radials 2+ and equal bilaterally.  Labs     Recent Labs  12/11/15 1943 12/12/15 0104 12/12/15 0639  TROPONINI <0.03 <0.03 <0.03   Lab Results  Component Value Date   WBC 10.1 12/11/2015   HGB 13.2  12/11/2015   HCT 39.5 12/11/2015   MCV 85.0 12/11/2015   PLT 305 12/11/2015     Recent Labs Lab 12/12/15 0104  NA 135  K 4.0  CL 103  CO2 24  BUN 40*  CREATININE 1.20*  CALCIUM 9.2  GLUCOSE 318*   Lab Results  Component Value Date   CHOL 171 04/04/2014   HDL 31* 04/04/2014   [place]  70 04/04/2014   TRIG 351* 04/04/2014     Radiology Studies    Dg Chest 2 View  12/11/2015  CLINICAL DATA:  Difficulty breathing for 2 days EXAM: CHEST  2 VIEW COMPARISON:  None. FINDINGS: Small pleural effusions. There is a indistinct opacity above the right minor fissure. Patchy opacity also seen at the left lower lobe over the diaphragm. No overt edema. Borderline cardiomegaly. Negative aortic and hilar contours. IMPRESSION: Patchy bilateral lung opacity primarily concerning for pneumonia. Small pleural effusions without pulmonary edema. Electronically Signed   By: Monte Fantasia M.D.   On: 12/11/2015 19:12    ECG & Cardiac Imaging    Aflutter, 108, freq pvc's,   Assessment & Plan    1.  Acute CHF:  Pt presented on 4/13 with a 2 day h/o of acute on chronic worsening of DOE associated with worsening LEE and orthopnea.  In ED, CXR was most consistent with CHF and BNP was mildly elevated.  She was also found to be in rate-controlled aflutter with freq PVC's.  She has responded well to IV lasix and her breathing has improved some, though she is still dyspneic with activity in her room.  BUN/Creat have bumped some with diuresis.  Lungs are clear on exam. She has chronic LEE and she says that her legs look about baseline today.  She is not sure of her weight @ home.  I will d/c her chlorthalidone and switch her to PO lasix.  Echo pending to assess for syst vs diast dysfxn (or both).  Further recs pending echo.  2.  Atrial Flutter:  Relatively well rate-controlled on  blocker therapy.  She denies palpitations.  Difficult to know if this is the primary driver behind her CHF Ss.  K nl, TSH nl.  Echo  pending.  Cont  blocker.  We discussed her stroke risk with a CHA2DS2VASc of 7 and she is agreeable to starting eliquis '5mg'$  bid.  If she does not convert on her own, we could consider DCCV following 4 wks of oral anticoagulation, though at this time, she is thinking that she'd prefer to avoid cardioversion.    3.  Hypertensive Heart Disease:  BP has been elevated but is better this am following diuresis.  Switch to PO diuretics and would plan to titrate  blocker if pressures remain high.  Cont acei and spiro.  4.  PVC's:  Frequent PVC's.  Cont  blocker.  Check Mg.  K wnl.  Echo pending.  Will consider outpt holter monitor to assess PVC burden.  5.  DM II:  Per IM.  Last A1c on record is from 03/2014 and was 9.7.  6.  Mild renal insufficiency:  In setting of IV diuresis.  Follow.  7.  HL:  Cont statin therapy.  8.  Morbid Obesity:  Would benefit from outpt nutritional counseling.  9.  COPD:  No active wheezing.  10. PAD:  H/o left LE bypass in [place].  No recent issues with claudication though she is very sedentary.    11.  H/o TIA/Stroke: on aggrenox @ home.  I will d/c as she is now going to be on eliquis.  Signed, Murray Hodgkins, NP 12/12/2015, 11:50 AM

## 2015-12-12 NOTE — ED Notes (Signed)
Called floor to check on status of getting this patient up to room 231.  I was told the "pager didn't go off".  They will call me when the room is ready.

## 2015-12-12 NOTE — Progress Notes (Signed)
*  PRELIMINARY RESULTS* Echocardiogram 2D Echocardiogram has been performed.  Megan Cisneros 12/12/2015, 2:32 PM

## 2015-12-12 NOTE — Care Management Note (Signed)
Case Management Note  Patient Details  Name: Megan Cisneros MRN: [id number] Date of Birth: 1956/02/28  Subjective/Objective:  CM consult for CHF and discharge planning. Met with paieint at bedside. She lives alone in an apartment. Uses a cane but needs a Rolator. [facility].  Would benefit from home health nursing, tele health and PT. No agency preference. Referral to [facility]. She does no drive. Family assists with transportation, groceries and medical appointments. PCP is Princella Ion. Denies issues obtaining medications, copays.                  Action/Plan: SN, PT and tele health with [facility].   Expected Discharge Date:                  Expected Discharge Plan:  [place]  In-House Referral:     Discharge planning Services  CM Consult  Post Acute Care Choice:  Home Health, Durable Medical Equipment Choice offered to:  Patient  DME Arranged:    DME Agency:  [facility] Arranged:  RN, PT (telehealth) [place]:  [facility] [place]  Status of Service:  In process, will continue to follow  Medicare Important Message Given:    Date Medicare IM Given:    Medicare IM give by:    Date Additional Medicare IM Given:    Additional Medicare Important Message give by:     If discussed at [place] of Stay Meetings, dates discussed:    Additional Comments:  Jolly Mango, RN 12/12/2015, 2:43 PM

## 2015-12-12 NOTE — Evaluation (Signed)
Physical Therapy Evaluation Patient Details Name: Megan Cisneros MRN: [id number] DOB: 10/27/1955 Today's Date: 12/12/2015   History of Present Illness  presented to ER secondary to SOB, LE edema; admitted with acute CHF exacerbation.  Clinical Impression  Upon evaluation, patient alert and oriented; follows all commands and demonstrates good insight/safety awareness.  Bilat UE/LE strength and ROM grossly WFL; no pain, sensory deficit reported.  Able to complete bed mobility with mod indep; sit/stand, basic transfers and gait (220') with RW, cga/close sup.  No buckling or LOB, but did require 2-3 standing rest breaks due to fatigue.  Sats maintained >92% on RA throughout session. Recommend use of RW for all mobility at this time; patient prefers use of 4WRW despite recommendations from PT.  Care management informed/aware. Would benefit from skilled PT to address above deficits and promote optimal return to PLOF; Recommend transition to [place] upon discharge from acute hospitalization.'     Follow Up Recommendations Home health PT    Equipment Recommendations   (therapist recommends RW, patient requesting [phone])    Recommendations for Other Services       Precautions / Restrictions Precautions Precautions: Fall Restrictions Weight Bearing Restrictions: No      Mobility  Bed Mobility Overal bed mobility: Modified Independent                Transfers Overall transfer level: Needs assistance Equipment used: Rolling walker (2 wheeled) Transfers: Sit to/from Stand Sit to Stand: Min guard            Ambulation/Gait Ambulation/Gait assistance: Min guard Ambulation Distance (Feet): 220 Feet Assistive device: Rolling walker (2 wheeled)       General Gait Details: reciprocal stepping, mild lateray sway (due to body habitus); 2-3 standing rest breaks throughout distance, but maintains sats >92% on RA  Stairs            Wheelchair Mobility    Modified Rankin (Stroke  Patients Only)       Balance Overall balance assessment: Needs assistance Sitting-balance support: No upper extremity supported;Feet supported Sitting balance-Leahy Scale: Good     Standing balance support: Bilateral upper extremity supported Standing balance-Leahy Scale: Fair                               Pertinent Vitals/Pain Pain Assessment: No/denies pain    Home Living Family/patient expects to be discharged to:: Private residence Living Arrangements: Alone Available Help at Discharge: Family;Available PRN/intermittently Type of Home: House Home Access: Level entry     Home Layout: One level Home Equipment: Cane - single point      Prior Function Level of Independence: Independent with assistive device(s)         Comments: Mod indep with ADLs, household activities and limited community mobility; uses SPC outside of home environment.  Denies fall history; denies home O2.  Does report household mobility progressively more difficulty due to worsening SOB in recent weeks/months.     Hand Dominance        Extremity/Trunk Assessment   Upper Extremity Assessment: Overall WFL for tasks assessed           Lower Extremity Assessment: Overall WFL for tasks assessed         Communication   Communication: No difficulties  Cognition Arousal/Alertness: Awake/alert Behavior During Therapy: WFL for tasks assessed/performed Overall Cognitive Status: Within Functional Limits for tasks assessed  General Comments      Exercises Other Exercises Other Exercises: Gait x75' with 4WRW, cga/close sup--reciprocal stepping; slight increase in sway, energy expenditure.  Ultimately recommend RW for patient, but patient prefers 4WRW due to potential use of seat if needed.      Assessment/Plan    PT Assessment Patient needs continued PT services  PT Diagnosis Difficulty walking;Generalized weakness   PT Problem List Decreased  strength;Decreased activity tolerance;Decreased balance;Decreased mobility;Decreased coordination;Decreased knowledge of use of DME;Decreased safety awareness;Decreased knowledge of precautions;Cardiopulmonary status limiting activity;Obesity  PT Treatment Interventions DME instruction;Gait training;Functional mobility training;Therapeutic activities;Therapeutic exercise;Patient/family education   PT Goals (Current goals can be found in the Care Plan section) Acute Rehab PT Goals Patient Stated Goal: to get one of those walkers with four wheels PT Goal Formulation: With patient Time For Goal Achievement: 12/26/15 Potential to Achieve Goals: Good    Frequency Min 2X/week   Barriers to discharge Decreased caregiver support      Co-evaluation               End of Session Equipment Utilized During Treatment: Gait belt Activity Tolerance: Patient tolerated treatment well Patient left: in bed;with call bell/phone within reach;with chair alarm set Nurse Communication: Mobility status         Time: 1021-1173 PT Time Calculation (min) (ACUTE ONLY): 37 min   Charges:   PT Evaluation $PT Eval Moderate Complexity: 1 Procedure PT Treatments $Gait Training: 8-22 mins   PT G Codes:        Katty Fretwell H. Owens Shark, PT, DPT, NCS 12/12/2015, 4:57 PM [phone]

## 2015-12-12 NOTE — Progress Notes (Signed)
Inpatient Diabetes Program Recommendations  AACE/ADA: New Consensus Statement on Inpatient Glycemic Control (2015)  Target Ranges:  Prepandial:   less than 140 mg/dL      Peak postprandial:   less than 180 mg/dL (1-2 hours)      Critically ill patients:  140 - 180 mg/dL   Review of Glycemic Control  Results for Megan Cisneros, Megan Cisneros (MRN [id number]) as of 12/12/2015 09:06  Ref. Range 12/12/2015 01:09 12/12/2015 07:20  Glucose-Capillary Latest Ref Range: 65-99 mg/dL 311 (H) 248 (H)    Diabetes history: Type 2, A1C pending Outpatient Diabetes medications: Glipizide '10mg'$  q day, Novolog 3 units tid, Metformin '1000mg'$  bid, Levemir 30 units qhs Current orders for Inpatient glycemic control: Glipizide '10mg'$  q day, Novolog 3 units tid, Novolog 0-9 units tid, Metformin '1000mg'$  bid, Levemir 30 units qhs  Inpatient Diabetes Program Recommendations:  Agree with current diabetes medication orders.  If fasting blood sugar remains elevated tomorrow am, may need to consider increasing Lantus.  Gentry Fitz, RN, BA, MHA, CDE Diabetes Coordinator Inpatient Diabetes Program  [phone] (Team Pager) [phone] ([place]) 12/12/2015 9:09 AM

## 2015-12-12 NOTE — Discharge Instructions (Addendum)
°  DIET:  Cardiac diet and Diabetic diet  DISCHARGE CONDITION:  Stable  ACTIVITY:  Activity as tolerated  OXYGEN:  Home Oxygen: No.   Oxygen Delivery: room air  DISCHARGE LOCATION:  home   If you experience worsening of your admission symptoms, develop shortness of breath, life threatening emergency, suicidal or homicidal thoughts you must seek medical attention immediately by calling 911 or calling your MD immediately  if symptoms less severe.  You Must read complete instructions/literature along with all the possible adverse reactions/side effects for all the Medicines you take and that have been prescribed to you. Take any new Medicines after you have completely understood and accpet all the possible adverse reactions/side effects.   Please note  You were cared for by a hospitalist during your hospital stay. If you have any questions about your discharge medications or the care you received while you were in the hospital after you are discharged, you can call the unit and asked to speak with the hospitalist on call if the hospitalist that took care of you is not available. Once you are discharged, your primary care physician will handle any further medical issues. Please note that NO REFILLS for any discharge medications will be authorized once you are discharged, as it is imperative that you return to your primary care physician (or establish a relationship with a primary care physician if you do not have one) for your aftercare needs so that they can reassess your need for medications and monitor your lab values.    - Daily fluids < 2 liters. - Low salt diet - Check weight everyday and keep log. Take to your doctors appt. - Take extra dose of lasix if you gain more than 3 pounds weight.  Reduce Lasix to once a day from 12/14/2015.

## 2015-12-13 DIAGNOSIS — I5033 Acute on chronic diastolic (congestive) heart failure: Secondary | ICD-10-CM | POA: Insufficient documentation

## 2015-12-13 LAB — GLUCOSE, CAPILLARY: GLUCOSE-CAPILLARY: 122 mg/dL — AB (ref 65–99)

## 2015-12-13 LAB — BASIC METABOLIC PANEL
ANION GAP: 9 (ref 5–15)
BUN: 42 mg/dL — ABNORMAL HIGH (ref 6–20)
CHLORIDE: 96 mmol/L — AB (ref 101–111)
CO2: 29 mmol/L (ref 22–32)
CREATININE: 1.21 mg/dL — AB (ref 0.44–1.00)
Calcium: 9.3 mg/dL (ref 8.9–10.3)
GFR calc non Af Amer: 47 mL/min — ABNORMAL LOW (ref >60)
GFR, EST AFRICAN AMERICAN: 55 mL/min — AB (ref >60)
GLUCOSE: 136 mg/dL — AB (ref 65–99)
Potassium: 3.8 mmol/L (ref 3.5–5.1)
Sodium: 134 mmol/L — ABNORMAL LOW (ref 135–145)

## 2015-12-13 LAB — MAGNESIUM: Magnesium: 1.3 mg/dL — ABNORMAL LOW (ref 1.7–2.4)

## 2015-12-13 MED ORDER — FUROSEMIDE 40 MG PO TABS: 20.0000 mg | ORAL_TABLET | Freq: Two times a day (BID) | ORAL | Status: DC

## 2015-12-13 MED ORDER — FUROSEMIDE 40 MG PO TABS
40.0000 mg | ORAL_TABLET | Freq: Every day | ORAL | Status: DC
  Administered 2015-12-13: 40 mg via ORAL
  Filled 2015-12-13: qty 1

## 2015-12-13 NOTE — Care Management Note (Signed)
Case Management Note  Patient Details  Name: Megan Cisneros MRN: [id number] Date of Birth: 1955-10-21  Subjective/Objective:      Referral faxed to [facility] requesting home health PT, RN, Telehealth. Has rolliator.               Action/Plan:   Expected Discharge Date:                  Expected Discharge Plan:  [place]  In-House Referral:     Discharge planning Services  CM Consult  Post Acute Care Choice:  Home Health, Durable Medical Equipment Choice offered to:  Patient  DME Arranged:    DME Agency:  [facility] Arranged:  RN, PT (telehealth) [place]:  [place]  Status of Service:  In process, will continue to follow  Medicare Important Message Given:    Date Medicare IM Given:    Medicare IM give by:    Date Additional Medicare IM Given:    Additional Medicare Important Message give by:     If discussed at [place] of Stay Meetings, dates discussed:    Additional Comments:  Rocco Kerkhoff A, RN 12/13/2015, 8:54 AM

## 2015-12-13 NOTE — Discharge Summary (Signed)
[facility] NAME: Megan Cisneros    MR#:  [id number]  DATE OF BIRTH:  Aug 11, 1956  DATE OF ADMISSION:  12/11/2015 ADMITTING PHYSICIAN: Fritzi Mandes, MD  DATE OF DISCHARGE: 12/13/15  PRIMARY CARE PHYSICIAN: Donnie Coffin, MD    ADMISSION DIAGNOSIS:  Shortness of breath [R06.02] Atrial fibrillation, unspecified type ([place]) [I48.91]  DISCHARGE DIAGNOSIS:  Acute Diastolic CHF Afib/aflutter,new onset (now on po eliquis) HTN -DM-2 obesity  SECONDARY DIAGNOSIS:   Past Medical History  Diagnosis Date  . PVD (peripheral vascular disease) ([place])     a. 2004 s/p RLE bypass - complicated by recurrent infection.  . Diabetes mellitus without complication ([place])     a. Dx as borderline diabetic in the 80's-->progressed over the years.  . Essential hypertension   . Cancer ([place])   . Stroke [facility])     a. TIA x 2;  b. Stroke in 2007 in [place].  . Chronic Dyspnea on Exertion     a. 2004 Stress test prior to LE bypass->reportedly nl.  . Morbid obesity ([place])   . Atrial flutter ([place])     a. Dx 11/2015 -->CHA2DS2VASc = 7-->Eliquis '5mg'$  BID.  Marland Kitchen PVC's (premature ventricular contractions)     HOSPITAL COURSE:  [name] is a 60 y.o. female with a known history of type 2 diabetes on insulin, morbid obesity, hypertension, History of TIA/CVA comes to the emergency room with increasing shortness of breath and leg edema PND and orthopnea for the last several days. Patient reports getting very short of breath and fatigued walking around the house. She has been having difficulty laying down flat has been waking up in the middle of the night gasping for air for last several days.   * Acute on chronic diastolic chf Likely from HTN, DM and possible COPD Continue lasix. K supplementation I/Os Daily weights Cardiology f/u after discharge -diuresed about 2/7 liters -sats 95% on RA  * New onset Afib Rate control with atenolol Eliquis  *  DM SSI  * Chronic SOB Likely has underlying COPD 40 pack smokign history  Overall better PT recommends HHPT D/c home later today  CONSULTS OBTAINED:  Treatment Team:  Minna Merritts, MD  DRUG ALLERGIES:  No Known Allergies  DISCHARGE MEDICATIONS:   Current Discharge Medication List    START taking these medications   Details  apixaban (ELIQUIS) 5 MG TABS tablet Take 1 tablet (5 mg total) by mouth 2 (two) times daily. Qty: 60 tablet, Refills: 0    atenolol (TENORMIN) 25 MG tablet Take 0.5 tablets (12.5 mg total) by mouth 2 (two) times daily. Qty: 60 tablet, Refills: 0    furosemide (LASIX) 40 MG tablet Take 0.5 tablets (20 mg total) by mouth 2 (two) times daily. For 2 days and then take 1 tab daily Qty: 90 tablet, Refills: 0      CONTINUE these medications which have NOT CHANGED   Details  amLODipine (NORVASC) 10 MG tablet Take 10 mg by mouth daily.    glipiZIDE (GLUCOTROL XL) 10 MG 24 hr tablet Take 10 mg by mouth daily as needed. Patient takes with levemir when blood sugar is high    insulin aspart (NOVOLOG) 100 UNIT/ML injection Inject 3 Units into the skin 3 (three) times daily before meals.    insulin detemir (LEVEMIR) 100 UNIT/ML injection Inject 30 Units into the skin at bedtime.    lisinopril (PRINIVIL,ZESTRIL) 40 MG tablet Take 40 mg by mouth  daily.    metFORMIN (GLUCOPHAGE) 500 MG tablet Take 1,000 mg by mouth 2 (two) times daily.     Multiple Vitamin (MULTIVITAMIN WITH MINERALS) TABS tablet Take 1 tablet by mouth daily.    omeprazole (PRILOSEC) 20 MG capsule Take 20 mg by mouth daily.    oxybutynin (DITROPAN) 5 MG tablet Take 5 mg by mouth 2 (two) times daily.    simvastatin (ZOCOR) 40 MG tablet Take 40 mg by mouth daily.    spironolactone (ALDACTONE) 25 MG tablet Take 25 mg by mouth daily.      STOP taking these medications     atenolol-chlorthalidone (TENORETIC) 50-25 MG tablet      dipyridamole-aspirin (AGGRENOX) 200-25 MG 12hr capsule          If you experience worsening of your admission symptoms, develop shortness of breath, life threatening emergency, suicidal or homicidal thoughts you must seek medical attention immediately by calling 911 or calling your MD immediately  if symptoms less severe.  You Must read complete instructions/literature along with all the possible adverse reactions/side effects for all the Medicines you take and that have been prescribed to you. Take any new Medicines after you have completely understood and accept all the possible adverse reactions/side effects.   Please note  You were cared for by a hospitalist during your hospital stay. If you have any questions about your discharge medications or the care you received while you were in the hospital after you are discharged, you can call the unit and asked to speak with the hospitalist on call if the hospitalist that took care of you is not available. Once you are discharged, your primary care physician will handle any further medical issues. Please note that NO REFILLS for any discharge medications will be authorized once you are discharged, as it is imperative that you return to your primary care physician (or establish a relationship with a primary care physician if you do not have one) for your aftercare needs so that they can reassess your need for medications and monitor your lab values. Today   SUBJECTIVE   Feels a lot better  VITAL SIGNS:  Blood pressure 159/52, pulse 74, temperature 98.3 F (36.8 C), temperature source Oral, resp. rate 16, height 5' (1.524 m), weight 97.614 kg (215 lb 3.2 oz), SpO2 94 %.  I/O:   Intake/Output Summary (Last 24 hours) at 12/13/15 0800 Last data filed at 12/13/15 0554  Gross per 24 hour  Intake   1240 ml  Output   2300 ml  Net  -1060 ml    PHYSICAL EXAMINATION:  GENERAL:  60 y.o.-year-old patient lying in the bed with no acute distress. obese EYES: Pupils equal, round, reactive to light and  accommodation. No scleral icterus. Extraocular muscles intact.  HEENT: Head atraumatic, normocephalic. Oropharynx and nasopharynx clear.  NECK:  Supple, no jugular venous distention. No thyroid enlargement, no tenderness.  LUNGS: Normal breath sounds bilaterally, no wheezing, rales,rhonchi or crepitation. No use of accessory muscles of respiration.  CARDIOVASCULAR: S1, S2 normal. No murmurs, rubs, or gallops.  ABDOMEN: Soft, non-tender, non-distended. Bowel sounds present. No organomegaly or mass.  EXTREMITIES: 2+ pedal edema,no  cyanosis, or clubbing.  -chronic venous stasis changes NEUROLOGIC: Cranial nerves II through XII are intact. Muscle strength 5/5 in all extremities. Sensation intact. Gait not checked.  PSYCHIATRIC: The patient is alert and oriented x 3.  SKIN: No obvious rash, lesion, or ulcer.   DATA REVIEW:   CBC   Recent Labs Lab 12/11/15  1943  WBC 10.1  HGB 13.2  HCT 39.5  PLT 305    Chemistries   Recent Labs Lab 12/13/15 0647  NA 134*  K 3.8  CL 96*  CO2 29  GLUCOSE 136*  BUN 42*  CREATININE 1.21*  CALCIUM 9.3  MG 1.3*    Microbiology Results   No results found for this or any previous visit (from the past 240 hour(s)).  RADIOLOGY:  Dg Chest 2 View  12/11/2015  CLINICAL DATA:  Difficulty breathing for 2 days EXAM: CHEST  2 VIEW COMPARISON:  None. FINDINGS: Small pleural effusions. There is a indistinct opacity above the right minor fissure. Patchy opacity also seen at the left lower lobe over the diaphragm. No overt edema. Borderline cardiomegaly. Negative aortic and hilar contours. IMPRESSION: Patchy bilateral lung opacity primarily concerning for pneumonia. Small pleural effusions without pulmonary edema. Electronically Signed   By: Monte Fantasia M.D.   On: 12/11/2015 19:12     Management plans discussed with the patient, family and they are in agreement.  CODE STATUS:     Code Status Orders        Start     Ordered   12/12/15 0042  Full  code   Continuous     12/12/15 0041    Code Status History    Date Active Date Inactive Code Status Order ID Comments User Context   This patient has a current code status but no historical code status.      TOTAL TIME TAKING CARE OF THIS PATIENT: 40 minutes.    Kaytelyn Glore M.D on 12/13/2015 at 8:00 AM  Between 7am to 6pm - Pager - [phone] After 6pm go to [email or web address] - password [email or web address] [facility]  Office  [phone]  CC: Primary care physician; Donnie Coffin, MD

## 2015-12-13 NOTE — Progress Notes (Signed)
Room air. NSR. Takes meds ok. Ambulated in the room. IV and tele removed. Discharge instructions given to pt. Pt has no further concerns.

## 2015-12-13 NOTE — Progress Notes (Signed)
Patient: Megan Cisneros / Admit Date: 12/11/2015 / Date of Encounter: 12/13/2015, 10:40 AM   Subjective: SOB improved, leg swelling better, Heading home today  Review of Systems: Review of Systems  Constitutional: Negative.   Respiratory: Negative.   Cardiovascular: Positive for leg swelling.  Gastrointestinal: Negative.   Musculoskeletal: Negative.   Neurological: Negative.   Psychiatric/Behavioral: Negative.   All other systems reviewed and are negative.   Objective: Telemetry: atrial flutter Physical Exam: Blood pressure 159/52, pulse 74, temperature 98.3 F (36.8 C), temperature source Oral, resp. rate 16, height 5' (1.524 m), weight 215 lb 3.2 oz (97.614 kg), SpO2 94 %. Body mass index is 42.03 kg/(m^2). General: Pleasant, NAD Psych: Normal affect. Neuro: Alert and oriented X 3. Moves all extremities spontaneously. HEENT: Normal Neck: Supple without bruits. Obese - difficult to gauge JVP 2/2 girth. Lungs: Resp regular and unlabored, CTA. Heart: IR, IR, no s3, s4, or murmurs. Abdomen: Obese, soft, non-tender, non-distended, BS + x 4.  Extremities: No clubbing, cyanosis. Reddening of bilat ankles/shins with 1+ RLE edema and trace LLE edema. DP/PT/Radials 2+ and equal bilaterally.   Intake/Output Summary (Last 24 hours) at 12/13/15 1040 Last data filed at 12/13/15 1017  Gross per 24 hour  Intake    720 ml  Output   2000 ml  Net  -1280 ml    Inpatient Medications:  . apixaban  5 mg Oral BID  . atenolol  50 mg Oral Daily  . furosemide  40 mg Oral Daily  . glipiZIDE  10 mg Oral Q supper  . insulin aspart  0-9 Units Subcutaneous TID WC  . insulin aspart  3 Units Subcutaneous TID AC  . insulin detemir  30 Units Subcutaneous QHS  . lisinopril  40 mg Oral Daily  . metFORMIN  1,000 mg Oral BID WC  . multivitamin with minerals  1 tablet Oral Q supper  . oxybutynin  5 mg Oral BID  . pantoprazole  40 mg Oral QAC breakfast  . simvastatin  40 mg Oral q1800  .  sodium chloride flush  3 mL Intravenous Q12H  . spironolactone  25 mg Oral Daily   Infusions:    Labs:  Recent Labs  12/12/15 0104 12/13/15 0647  NA 135 134*  K 4.0 3.8  CL 103 96*  CO2 24 29  GLUCOSE 318* 136*  BUN 40* 42*  CREATININE 1.20* 1.21*  CALCIUM 9.2 9.3  MG  --  1.3*   No results for input(s): AST, ALT, ALKPHOS, BILITOT, PROT, ALBUMIN in the last 72 hours.  Recent Labs  12/11/15 1943  WBC 10.1  NEUTROABS 5.9  HGB 13.2  HCT 39.5  MCV 85.0  PLT 305    Recent Labs  12/11/15 1943 12/12/15 0104 12/12/15 0639 12/12/15 1236  TROPONINI <0.03 <0.03 <0.03 <0.03   Invalid input(s): POCBNP  Recent Labs  12/12/15 0104  HGBA1C 9.2*     Weights: Filed Weights   12/12/15 0059 12/12/15 0435 12/13/15 0413  Weight: 218 lb 12.8 oz (99.247 kg) 219 lb 11.2 oz (99.655 kg) 215 lb 3.2 oz (97.614 kg)     Radiology/Studies:  Dg Chest 2 View  12/11/2015  CLINICAL DATA:  Difficulty breathing for 2 days EXAM: CHEST  2 VIEW COMPARISON:  None. FINDINGS: Small pleural effusions. There is a indistinct opacity above the right minor fissure. Patchy opacity also seen at the left lower lobe over the diaphragm. No overt edema. Borderline cardiomegaly. Negative aortic and hilar contours. IMPRESSION: Patchy bilateral  lung opacity primarily concerning for pneumonia. Small pleural effusions without pulmonary edema. Electronically Signed   By: Monte Fantasia M.D.   On: 12/11/2015 19:12     Assessment and Plan  60 y.o. female   ----> Atrial flutter, PVCs Unclear when her arrhythmia initially presented, asymptomatic. Likely contributing to CHF sx CHADS-VASC is high : stroke risk, PAD, female, would clearly benefit from anticoagulation. --continue eliquis 5 mg po BID.  -discontinue aspirin   ----> Acute on chronic CHF, diastolic -Agree with lasix BID, close outpt monitoring Would d/c chlorthalidone  ----> Sleep apnea Underlying morbid obesity, May need outpatient sleep  study, will discuss with her in follow up  -----> PVCs In the setting of normal ejection fraction,  less worrisome  ---->pad Goal total cholesterol less than 150, LDL less than 70 Can monitor as an outpt, adjust meds if needed  Signed, Esmond Plants, MD, Ph.D. [facility] 12/13/2015, 10:40 AM

## 2015-12-13 NOTE — Progress Notes (Signed)
Nutrition Education Note  RD consulted for nutrition education regarding new onset CHF and past medical history of DM.  RD provided "Low Sodium Nutrition Therapy" and Carbohydrate Counting and Label Reading for DM handout from the Academy of [employer] and Dietetics. Reviewed patient's dietary recall. Provided examples on ways to decrease sodium intake in diet. Discouraged intake of processed foods and use of salt shaker. Encouraged fresh fruits and vegetables as well as whole grain sources of carbohydrates to maximize fiber intake. Discussed carbohydrates, portion sizes and correct way to read food label  RD discussed why it is important for patient to adhere to diet recommendations, and emphasized the role of fluids, foods to avoid, and importance of weighing self daily. Teach back method used.  Expect good compliance.  Body mass index is 42.03 kg/(m^2).   Current diet order is heart healthy/carb modified, patient is consuming approximately 75-100% of meals at this time. Labs and medications reviewed. No further nutrition interventions warranted at this time. RD contact information provided. If additional nutrition issues arise, please re-consult RD.   Megan Cisneros, [place], [place] (pager) Weekend/On-Call pager [phone])

## 2015-12-13 NOTE — Progress Notes (Signed)
[facility] [place] NAME: Megan Cisneros    MR#:  [id number]  DATE OF BIRTH:  March 04, 1956  SUBJECTIVE:  CHIEF COMPLAINT:   Chief Complaint  Patient presents with  . Respiratory Distress   Still has SOB and LE edema  REVIEW OF SYSTEMS:    Review of Systems  Constitutional: Positive for malaise/fatigue. Negative for fever and chills.  HENT: Negative for sore throat.   Eyes: Negative for blurred vision, double vision and pain.  Respiratory: Positive for shortness of breath. Negative for cough, hemoptysis and wheezing.   Cardiovascular: Positive for orthopnea and leg swelling. Negative for chest pain and palpitations.  Gastrointestinal: Negative for heartburn, nausea, vomiting, abdominal pain, diarrhea and constipation.  Genitourinary: Negative for dysuria and hematuria.  Musculoskeletal: Negative for back pain and joint pain.  Skin: Negative for rash.  Neurological: Negative for sensory change, speech change, focal weakness and headaches.  Endo/Heme/Allergies: Does not bruise/bleed easily.  Psychiatric/Behavioral: Negative for depression. The patient is not nervous/anxious.     DRUG ALLERGIES:  No Known Allergies  VITALS:  Blood pressure 140/48, pulse 48, temperature 98.1 F (36.7 C), temperature source Oral, resp. rate 16, height 5' (1.524 m), weight 99.655 kg (219 lb 11.2 oz), SpO2 93 %.  PHYSICAL EXAMINATION:   Physical Exam  GENERAL:  60 y.o.-year-old patient lying in the bed with no acute distress.  EYES: Pupils equal, round, reactive to light and accommodation. No scleral icterus. Extraocular muscles intact.  HEENT: Head atraumatic, normocephalic. Oropharynx and nasopharynx clear.  NECK:  Supple, no jugular venous distention. No thyroid enlargement, no tenderness.  LUNGS: Normal breath sounds bilaterally, no wheezing, rales, rhonchi. No use of accessory muscles of respiration.  CARDIOVASCULAR: S1, S2 normal. No  murmurs, rubs, or gallops.  ABDOMEN: Soft, nontender, nondistended. Bowel sounds present. No organomegaly or mass.  EXTREMITIES: No cyanosis, clubbing. 2+ LE edema   NEUROLOGIC: Cranial nerves II through XII are intact. No focal Motor or sensory deficits b/l.   PSYCHIATRIC: The patient is alert and oriented x 3.  SKIN: No obvious rash, lesion, or ulcer.   LABORATORY PANEL:   CBC  Recent Labs Lab 12/11/15 1943  WBC 10.1  HGB 13.2  HCT 39.5  PLT 305   ------------------------------------------------------------------------------------------------------------------ Chemistries   Recent Labs Lab 12/12/15 0104  NA 135  K 4.0  CL 103  CO2 24  GLUCOSE 318*  BUN 40*  CREATININE 1.20*  CALCIUM 9.2   ------------------------------------------------------------------------------------------------------------------  Cardiac Enzymes  Recent Labs Lab 12/12/15 1236  TROPONINI <0.03   ------------------------------------------------------------------------------------------------------------------  RADIOLOGY:  Dg Chest 2 View  12/11/2015  CLINICAL DATA:  Difficulty breathing for 2 days EXAM: CHEST  2 VIEW COMPARISON:  None. FINDINGS: Small pleural effusions. There is a indistinct opacity above the right minor fissure. Patchy opacity also seen at the left lower lobe over the diaphragm. No overt edema. Borderline cardiomegaly. Negative aortic and hilar contours. IMPRESSION: Patchy bilateral lung opacity primarily concerning for pneumonia. Small pleural effusions without pulmonary edema. Electronically Signed   By: Monte Fantasia M.D.   On: 12/11/2015 19:12     ASSESSMENT AND PLAN:   * Acute on chronic diastolic chf Likely from HTN, DM and possible COPD Continue lasix. K supplementation I/Os Daily weights Cardiology f/u after discharge  * New onset Afib Rate control Eliquis  * DM SSI  * Chronic SOB Likely has underlying COPD 40 pack smokign history  All the  records are reviewed and case discussed  with Care Management/Social Workerr. Management plans discussed with the patient, family and they are in agreement.  CODE STATUS: FULL  DVT Prophylaxis: SCDs  TOTAL TIME TAKING CARE OF THIS PATIENT: 35 minutes.   POSSIBLE D/C IN 1-2 DAYS, DEPENDING ON CLINICAL CONDITION.  Hillary Bow R M.D on 12/13/2015 at 1:09 AM  Between 7am to 6pm - Pager - [phone]  After 6pm go to [email or web address] - password [email or web address] [facility]  Office  [phone]  CC: Primary care physician; Donnie Coffin, MD  Note: This dictation was prepared with Dragon dictation along with smaller phrase technology. Any transcriptional errors that result from this process are unintentional.

## 2015-12-15 ENCOUNTER — Telehealth: Payer: Self-pay | Admitting: Cardiovascular Disease

## 2015-12-15 NOTE — Telephone Encounter (Signed)
Patient contacted regarding discharge from [facility] on 12/11/15.  Patient understands to follow up with Ignacia Bayley, NP on 12/17/15 at 1:30 at [facility]. Patient understands discharge instructions? yes Patient understands medications and regiment? yes Patient understands to bring all medications to this visit? yes

## 2015-12-15 NOTE — Telephone Encounter (Signed)
TCM..the patient calling stating she was seen in hospital by Dr Rockey Situ and Ignacia Bayley  Is coming 12/17/15 to see Ignacia Bayley

## 2015-12-16 LAB — CULTURE, BLOOD (ROUTINE X 2)
Culture: NO GROWTH
Culture: NO GROWTH

## 2015-12-17 ENCOUNTER — Ambulatory Visit (INDEPENDENT_AMBULATORY_CARE_PROVIDER_SITE_OTHER): Payer: Medicare Other | Admitting: Nurse Practitioner

## 2015-12-17 ENCOUNTER — Telehealth: Payer: Self-pay | Admitting: Cardiovascular Disease

## 2015-12-17 ENCOUNTER — Other Ambulatory Visit
Admission: RE | Admit: 2015-12-17 | Discharge: 2015-12-17 | Disposition: A | Discharge status: Home / Self Care | Payer: Medicare Other | Source: Ambulatory Visit | Attending: Nurse Practitioner | Admitting: Nurse Practitioner

## 2015-12-17 ENCOUNTER — Encounter: Payer: Self-pay | Admitting: Nurse Practitioner

## 2015-12-17 VITALS — BP 160/64 | HR 84 | Ht 60.0 in | Wt 215.0 lb

## 2015-12-17 DIAGNOSIS — I110 Hypertensive heart disease with heart failure: Secondary | ICD-10-CM | POA: Diagnosis not present

## 2015-12-17 DIAGNOSIS — E785 Hyperlipidemia, unspecified: Secondary | ICD-10-CM | POA: Diagnosis not present

## 2015-12-17 DIAGNOSIS — I509 Heart failure, unspecified: Secondary | ICD-10-CM | POA: Diagnosis not present

## 2015-12-17 DIAGNOSIS — R0602 Shortness of breath: Secondary | ICD-10-CM

## 2015-12-17 DIAGNOSIS — I739 Peripheral vascular disease, unspecified: Secondary | ICD-10-CM

## 2015-12-17 DIAGNOSIS — I5032 Chronic diastolic (congestive) heart failure: Secondary | ICD-10-CM | POA: Diagnosis present

## 2015-12-17 DIAGNOSIS — I483 Typical atrial flutter: Secondary | ICD-10-CM

## 2015-12-17 LAB — BASIC METABOLIC PANEL
ANION GAP: 10 (ref 5–15)
BUN: 39 mg/dL — AB (ref 6–20)
CO2: 30 mmol/L (ref 22–32)
CREATININE: 1.23 mg/dL — AB (ref 0.44–1.00)
Calcium: 8.9 mg/dL (ref 8.9–10.3)
Chloride: 98 mmol/L — ABNORMAL LOW (ref 101–111)
GFR calc Af Amer: 54 mL/min — ABNORMAL LOW (ref >60)
GFR, EST NON AFRICAN AMERICAN: 46 mL/min — AB (ref >60)
GLUCOSE: 114 mg/dL — AB (ref 65–99)
Potassium: 4.1 mmol/L (ref 3.5–5.1)
Sodium: 138 mmol/L (ref 135–145)

## 2015-12-17 MED ORDER — FUROSEMIDE 40 MG PO TABS: 40.0000 mg | ORAL_TABLET | Freq: Every day | ORAL | Status: DC

## 2015-12-17 NOTE — Progress Notes (Signed)
Office Visit    Patient Name: Megan Cisneros Date of Encounter: 12/17/2015  Primary Care Provider:  Donnie Coffin, MD Primary Cardiologist:  Johnny Bridge, MD   Chief Complaint    60 year old female status post recent hospital station for diastolic CHF and atrial flutter, who presents for follow-up.  Past Medical History    Past Medical History  Diagnosis Date  . PVD (peripheral vascular disease) ([place])     a. 2004 s/p RLE bypass - complicated by recurrent infection.  . Diabetes mellitus without complication ([place])     a. Dx as borderline diabetic in the 80's-->progressed over the years.  . Essential hypertension   . Cancer ([place])   . Stroke [facility])     a. TIA x 2;  b. Stroke in 2007 in [place].  . Chronic Dyspnea on Exertion     a. 2004 Stress test prior to LE bypass->reportedly nl.  . Morbid obesity ([place])   . Atrial flutter ([place])     a. Dx 11/2015 -->CHA2DS2VASc = 7-->Eliquis '5mg'$  BID.  Marland Kitchen PVC's (premature ventricular contractions)   . Chronic diastolic CHF (congestive heart failure) ([place])     a. 11/2015 Echo: EF 60-65%, no rwma, mild MR, mildly dil LA, nl RV fxn.   Past Surgical History  Procedure Laterality Date  . Leg surgery    . Cesarean section    . Cataract surgery Right     03/2014    Allergies  No Known Allergies  History of Present Illness    60 y/o ? with the above complex PMH.  She has a h/o DM and HTN dating back to the 52's.  She has suffered two TIA's and a stroke (2007 - [place]). She is also s/p peripheral arterial bypass on the RLE, though details on this aren't clear as it was performed in [place], [place] in 2004.  Since then, she has had chronic RLE edema.  She lives locally by herself but has family nearby that help her out, as she is very sedentary. She has chronic DOE but has never had c/p.  She has undergone stress testing before, prior to her LE bypass in 2004, and it was reportedly nl.  She was in her usual state of health until Tuesday 4/11, when she  began noting significantly worsened DOE and also worsening of her lower ext edema.  DOE persisted throughout the day 4/11 and she noted worsened orthopnea that night (she typically sleeps on 5-6 pillows @ baseline).  Ss persisted throughout the day on 4/12 and 4/13, and her family brought her into [facility] ED on the afternoon of 4/13.  There, BNP was 362 and CXR showed vascular congestion.  She was also noted to be in atrial flutter, which was rate-controlled in the 80's.  She was treated with IV lasix with good response and some improvement in breathing.  We saw her and switched her to PO Lasix and also started eliquis 5 bid given CHA2DS2VASc of 7.  She expressed a desire to avoid cardioversion if possible. Her weight has been 2:15 daily. She was subsequently d/c'd on 4/15 @ a wt of 215 lbs.    Since discharge, she has done reasonably well. She has not had significant dyspnea on exertion and though she continues to have bilateral lower extremity edema, this is stable since discharge.  She has been preparing her own meals at home and has dramatically reduced her usage of salt.  She is avoiding convenience foods.  She is  asymp from an aflutter standpoint and denies palpitations, presyncope, or chest pain.  She denies pnd, orthopnea, n, v, dizziness, syncope, weight gain, or early satiety.    Home Medications    Prior to Admission medications   Medication Sig Start Date End Date Taking? Authorizing Provider  amLODipine (NORVASC) 10 MG tablet Take 10 mg by mouth daily.   Yes Historical Provider, MD  apixaban (ELIQUIS) 5 MG TABS tablet Take 1 tablet (5 mg total) by mouth 2 (two) times daily. 12/12/15  Yes Srikar Sudini, MD  atenolol (TENORMIN) 25 MG tablet Take 0.5 tablets (12.5 mg total) by mouth 2 (two) times daily. 12/12/15  Yes Srikar Sudini, MD  furosemide (LASIX) 40 MG tablet Take 1 tablet (40 mg total) by mouth daily. For 2 days and then take 1 tab daily 12/17/15  Yes Rogelia Mire, NP  glipiZIDE  (GLUCOTROL XL) 10 MG 24 hr tablet Take 10 mg by mouth daily as needed. Patient takes with levemir when blood sugar is high   Yes Historical Provider, MD  insulin aspart (NOVOLOG) 100 UNIT/ML injection Inject 3 Units into the skin 3 (three) times daily before meals.   Yes Historical Provider, MD  insulin detemir (LEVEMIR) 100 UNIT/ML injection Inject 30 Units into the skin at bedtime.   Yes Historical Provider, MD  lisinopril (PRINIVIL,ZESTRIL) 40 MG tablet Take 40 mg by mouth daily.   Yes Historical Provider, MD  metFORMIN (GLUCOPHAGE) 500 MG tablet Take 1,000 mg by mouth 2 (two) times daily.    Yes Historical Provider, MD  Multiple Vitamin (MULTIVITAMIN WITH MINERALS) TABS tablet Take 1 tablet by mouth daily.   Yes Historical Provider, MD  omeprazole (PRILOSEC) 20 MG capsule Take 20 mg by mouth daily.   Yes Historical Provider, MD  oxybutynin (DITROPAN) 5 MG tablet Take 5 mg by mouth 2 (two) times daily.   Yes Historical Provider, MD  simvastatin (ZOCOR) 40 MG tablet Take 40 mg by mouth daily.   Yes Historical Provider, MD  spironolactone (ALDACTONE) 25 MG tablet Take 25 mg by mouth daily.   Yes Historical Provider, MD    Review of Systems    As above, she has some degree of chronic DOE and LEE, but both are stable and significantly improved compared with what she was experiencing over the past few months.  She was able to wear a pair of sneakers today that she had not been able to get over her swollen feet for several year.  She denies chest pain, palpitations, pnd, orthopnea, n, v, dizziness, syncope, weight gain, or early satiety.  All other systems reviewed and are otherwise negative except as noted above.  Physical Exam    VS:  BP 160/64 mmHg  Pulse 84  Ht 5' (1.524 m)  Wt 215 lb (97.523 kg)  BMI 41.99 kg/m2 , BMI Body mass index is 41.99 kg/(m^2). Blood pressure was 130/60 on repeat. GEN: obese, no acute distress. HEENT: normal. Neck: Supple, no carotid bruits, or masses.  Difficult  to gauge JVP secondary to girth.  Cardiac: Irregular, no murmurs, rubs, or gallops. No clubbing, cyanosis,  2+ bilateral lower extremity/ankle edema.  Radials/DP/PT 2+ and equal bilaterally.  Respiratory:  Respirations regular and unlabored, clear to auscultation bilaterally. GI: Soft, nontender, nondistended, BS + x 4. MS: no deformity or atrophy. Skin: warm and dry, no rash. Neuro:  Strength and sensation are intact. Psych: Normal affect.  Accessory Clinical Findings    ECG - Atrial flutter with variable conduction, 78, left  axis deviation, left anterior fascicular block, poor R-wave progression and nonspecific ST changes. No acute changes.  Assessment & Plan    1.  Chronic diastolic congestive heart failure: Patient was recently admitted with progressive dyspnea on exertion and lower extremity edema. She was found to be volume overloaded with evidence of heart failure on exam. She was successfully diuresed and was discharged home at 215 pounds. Echocardiogram showed normal LV function. She was also found to be in atrial flutter during admission. Since discharge, she has done well her weight has remained stable at home. She is weighing herself daily and also closely watching her sodium intake. She has been compliant with her medications. She does have lower extremity edema on exam, though this is improved from hospitalization. She does have a history of chronic right greater than left lower extremity edema and has been trying to keep her legs elevated during the day when sitting. I will continue Lasix 40 mg daily along with atenolol, lisinopril, and prolactin. I will follow-up a basic metabolic panel today.  We discussed the importance of daily weights, sodium restriction, medication compliance, and symptom reporting and she verbalizes understanding.   2. Atrial flutter: Patient remains in rate-controlled atrial flutter. This is asymptomatic though it is difficult to know to what extent this  contributing to her diastolic failure. As she is currently stable, she is not sure that she would want to go forward with cardioversion in the future. She remains on eliquis 5 mg twice a day. I will plan to bring her back in 3-4 weeks and we can rediscuss cardioversion as at that point, she will have been adequately anticoagulated. Continue beta blocker therapy.  3. Hypertensive heart disease: Blood pressure was initially elevated but improved on repeat. She says home health nurse has checked it and it has been in the 120s. Continue beta blocker, diuretics, ACE inhibitor, and calcium channel blocker therapy.  4. Hyperlipidemia: Continue statin therapy.  5. Type 2 diabetes mellitus: This is followed closely by primary care. She is on insulin.  6. Peripheral arterial disease: She has a prior history of right lower extremity bypass. Details are unknown as this took place in [place]. She denies any recent claudication. She remains on statin therapy.  7. COPD:  No active wheezing.  8.  H/O TIA/Stroke:  Prev on aggrenox  d/c'd now that she is on eliquis.  9.  Disposition: Follow-up in 3-4 weeks. Follow-up basic metabolic panel today.   Murray Hodgkins, NP 12/17/2015, 1:10 PM

## 2015-12-17 NOTE — Addendum Note (Signed)
Addended by: Michae Kava on: 12/17/2015 01:29 PM   Modules accepted: Orders

## 2015-12-17 NOTE — Patient Instructions (Signed)
Medication Instructions:  Your physician recommends that you continue on your current medications as directed. Please refer to the Current Medication list given to you today.  A REFILL FOR LASIX HAS BEEN SENT IN   Labwork: TODAY BMET  Testing/Procedures: NONE  Follow-Up: 3-4 WEEK WITH DR. Rockey Situ OR APP  Any Other Special Instructions Will Be Listed Below (If Applicable).   If you need a refill on your cardiac medications before your next appointment, please call your pharmacy.

## 2015-12-17 NOTE — Telephone Encounter (Signed)
Please call [place] on [facility] # [phone] needs correct directions on furosemide (LASIX) 40 MG tablet.

## 2015-12-17 NOTE — Telephone Encounter (Signed)
Already called the pharmacy and took care of this, thank you

## 2016-01-16 ENCOUNTER — Telehealth: Payer: Self-pay | Admitting: Cardiovascular Disease

## 2016-01-16 NOTE — Telephone Encounter (Signed)
Patient needs a PA for Eliquis .  Please call [phone] to discuss pa with insurance co  Patient calling the office for samples of medication:   1.  What medication and dosage are you requesting samples for?    Eliquis 5 mg po x 2 daily   2.  Are you currently out of this medication?  YES  .

## 2016-01-19 ENCOUNTER — Ambulatory Visit (INDEPENDENT_AMBULATORY_CARE_PROVIDER_SITE_OTHER): Payer: Medicare Other | Admitting: Cardiovascular Disease

## 2016-01-19 ENCOUNTER — Encounter: Payer: Self-pay | Admitting: Cardiovascular Disease

## 2016-01-19 ENCOUNTER — Encounter (INDEPENDENT_AMBULATORY_CARE_PROVIDER_SITE_OTHER): Payer: Self-pay

## 2016-01-19 VITALS — BP 142/68 | HR 97 | Ht 60.0 in | Wt 216.8 lb

## 2016-01-19 DIAGNOSIS — I5033 Acute on chronic diastolic (congestive) heart failure: Secondary | ICD-10-CM

## 2016-01-19 DIAGNOSIS — I483 Typical atrial flutter: Secondary | ICD-10-CM | POA: Diagnosis not present

## 2016-01-19 DIAGNOSIS — I10 Essential (primary) hypertension: Secondary | ICD-10-CM | POA: Diagnosis not present

## 2016-01-19 DIAGNOSIS — R600 Localized edema: Secondary | ICD-10-CM

## 2016-01-19 DIAGNOSIS — R0602 Shortness of breath: Secondary | ICD-10-CM

## 2016-01-19 MED ORDER — CLONIDINE HCL 0.1 MG PO TABS: 0.1000 mg | ORAL_TABLET | Freq: Two times a day (BID) | ORAL | Status: DC

## 2016-01-19 NOTE — Assessment & Plan Note (Signed)
Lower extremity edema likely secondary to venous insufficiency, Possibly exacerbated by amlodipine Recommended she stop amlodipine, wear compression hose, leg elevation

## 2016-01-19 NOTE — Patient Instructions (Addendum)
You are doing well.  Please stop the amlodipine Please start clonidine one pill twice a day  Please wear compression hose, or ACE wrap as tolerated  Please call us if you have new issues that need to be addressed before your next appt.  Your physician wants you to follow-up in: 2 months.   Date & time: ____________________________

## 2016-01-19 NOTE — Telephone Encounter (Signed)
Samples placed at front desk for pick up.

## 2016-01-19 NOTE — Assessment & Plan Note (Signed)
Shortness of breath is stable on her current diuretic regiment, heart rate well controlled

## 2016-01-19 NOTE — Assessment & Plan Note (Signed)
Given her lower extremity swelling, recommended she stop the amlodipine We will start clonidine 0.1 mg twice a day Recommended she monitor her blood pressure

## 2016-01-19 NOTE — Assessment & Plan Note (Addendum)
We have encouraged continued exercise, careful diet management in an effort to lose weight.   Total encounter time more than 25 minutes  Greater than 50% was spent in counseling and coordination of care with the patient

## 2016-01-19 NOTE — Assessment & Plan Note (Signed)
Appears relatively euvolemic by her lab work, mild prerenal state We'll continue her current Lasix 40 mg daily for now

## 2016-01-19 NOTE — Assessment & Plan Note (Addendum)
Heart rate relatively well-controlled on her current medications, tolerating anticoagulation Long discussion concerning management of atrial flutter, she does not want cardioversion

## 2016-01-19 NOTE — Progress Notes (Signed)
Patient ID: Megan Cisneros, female    DOB: [birth year], 60 y.o.   MRN: [id number]  HPI Comments: 60 y/o woman with history of  DM and HTN,  two TIA's and a stroke (2007 - [place]), s/p peripheral arterial bypass on the RLE in [place], [place] in 2004, chronic RLE edema, chronic DOE who presents for follow-up of her atrial flutter  That started April 2017  stress testing  prior to her LE bypass in 2004 reportedly nl.  In follow-up today, she reports having continued bilateral lower extremity swelling She has been compliant with her Lasix 40 mg daily, reports that weight is stable Prior discharge weight 215 pounds. Weight on today's visit 216 pounds Previously only had right lower extreme a swelling, now has bilateral leg swelling since she left the hospital  Long discussion concerning her atrial fibrillation and symptoms. She does have some fatigue, shortness of breath but does not want any intervention on her atrial flutter such as cardioversion  Tolerating anticoagulation but difficulty getting the medication to the pharmacy, covered by insurance  EKG on today's visit shows atrial flutter with ventricular rate 97 bpm, PVCs noted in a bigeminal pattern  Other past medical history  she had worsened DOE, family brought her into [facility] ED on the afternoon of 12/11/15 noted to be in atrial flutter, which was rate-controlled in the 80's.  eliquis 5 bid given CHA2DS2VASc of 7.    d/c'd on 4/15 @ a wt of 215 lbs.      No Known Allergies  Current Outpatient Prescriptions on File Prior to Visit  Medication Sig Dispense Refill  . apixaban (ELIQUIS) 5 MG TABS tablet Take 1 tablet (5 mg total) by mouth 2 (two) times daily. 60 tablet 0  . atenolol (TENORMIN) 25 MG tablet Take 0.5 tablets (12.5 mg total) by mouth 2 (two) times daily. 60 tablet 0  . furosemide (LASIX) 40 MG tablet Take 1 tablet (40 mg total) by mouth daily. 90 tablet 3  . glipiZIDE (GLUCOTROL XL) 10 MG 24 hr tablet Take 10 mg by mouth  daily as needed. Patient takes with levemir when blood sugar is high    . insulin aspart (NOVOLOG) 100 UNIT/ML injection Inject 3 Units into the skin 3 (three) times daily before meals.    . insulin detemir (LEVEMIR) 100 UNIT/ML injection Inject 30 Units into the skin at bedtime.    Marland Kitchen lisinopril (PRINIVIL,ZESTRIL) 40 MG tablet Take 40 mg by mouth daily.    . metFORMIN (GLUCOPHAGE) 500 MG tablet Take 1,000 mg by mouth 2 (two) times daily.     . Multiple Vitamin (MULTIVITAMIN WITH MINERALS) TABS tablet Take 1 tablet by mouth daily.    Marland Kitchen omeprazole (PRILOSEC) 20 MG capsule Take 20 mg by mouth daily.    Marland Kitchen oxybutynin (DITROPAN) 5 MG tablet Take 5 mg by mouth 2 (two) times daily.    Marland Kitchen spironolactone (ALDACTONE) 25 MG tablet Take 25 mg by mouth daily.     No current facility-administered medications on file prior to visit.    Past Medical History  Diagnosis Date  . PVD (peripheral vascular disease) ([place])     a. 2004 s/p RLE bypass - complicated by recurrent infection.  . Diabetes mellitus without complication ([place])     a. Dx as borderline diabetic in the 80's-->progressed over the years.  . Essential hypertension   . Cancer ([place])   . Stroke [facility])     a. TIA x 2;  b. Stroke in 2007  in [place].  . Chronic Dyspnea on Exertion     a. 2004 Stress test prior to LE bypass->reportedly nl.  . Morbid obesity ([place])   . Atrial flutter ([place])     a. Dx 11/2015 -->CHA2DS2VASc = 7-->Eliquis '5mg'$  BID.  Marland Kitchen PVC's (premature ventricular contractions)   . Chronic diastolic CHF (congestive heart failure) ([place])     a. 11/2015 Echo: EF 60-65%, no rwma, mild MR, mildly dil LA, nl RV fxn.    Past Surgical History  Procedure Laterality Date  . Leg surgery    . Cesarean section    . Cataract surgery Right     03/2014    Social History  reports that she has quit smoking. She does not have any smokeless tobacco history on file. She reports that she does not drink alcohol.  Family History family history includes  Cancer in her brother and mother; Heart attack in her father.   Review of Systems  Constitutional: Negative.   Respiratory: Negative.   Cardiovascular: Negative.   Gastrointestinal: Negative.   Musculoskeletal: Negative.   Neurological: Negative.   Hematological: Negative.   Psychiatric/Behavioral: Negative.   All other systems reviewed and are negative.   BP 142/68 mmHg  Pulse 97  Ht 5' (1.524 m)  Wt 216 lb 12 oz (98.317 kg)  BMI 42.33 kg/m2  Physical Exam  Constitutional: She is oriented to person, place, and time. She appears well-developed and well-nourished.  HENT:  Head: Normocephalic.  Nose: Nose normal.  Mouth/Throat: Oropharynx is clear and moist.  Eyes: Conjunctivae are normal. Pupils are equal, round, and reactive to light.  Neck: Normal range of motion. Neck supple. No JVD present.  Cardiovascular: Normal rate, normal heart sounds and intact distal pulses.  An irregularly irregular rhythm present. Exam reveals no gallop and no friction rub.   No murmur heard. 1+ bilateral lower extremity edema to below the knees  Pulmonary/Chest: Effort normal and breath sounds normal. No respiratory distress. She has no wheezes. She has no rales. She exhibits no tenderness.  Abdominal: Soft. Bowel sounds are normal. She exhibits no distension. There is no tenderness.  Musculoskeletal: Normal range of motion. She exhibits no edema or tenderness.  Lymphadenopathy:    She has no cervical adenopathy.  Neurological: She is alert and oriented to person, place, and time. Coordination normal.  Skin: Skin is warm and dry. No rash noted. No erythema.  Psychiatric: She has a normal mood and affect. Her behavior is normal. Judgment and thought content normal.

## 2016-01-20 ENCOUNTER — Telehealth: Payer: Self-pay | Admitting: Cardiovascular Disease

## 2016-01-20 NOTE — Telephone Encounter (Signed)
Spoke w/ Konrad Dolores, [facility] RN. Pt has not picked up clonidine rx yet, took her amlodipine & atenolol this am. HR at rest 54, on recheck 71; he reports HR is speeding up and slowing down; advised him that yesterday's EKG showed atrial flutter.  Advised Tommy to have pt pick up new rx today and call back if sx continue.  He is appreciative and will call back w/ any further questions or concerns.

## 2016-01-20 NOTE — Telephone Encounter (Signed)
Physical Therapist Assistant states pt HR is 54 at rest. BP is 142/64. States she is asymptomatic. Denies CP, nausea. States she was a little dizzy overnight. Doesn't sleep well, doesn't feel like doing her therapy today. Please call.

## 2016-01-22 ENCOUNTER — Telehealth: Payer: Self-pay | Admitting: Cardiovascular Disease

## 2016-01-22 NOTE — Telephone Encounter (Signed)
Left message pt's VM to let her know we are currently awaiting PA forms from [facility] to be filled out and faxed for PA review. Pt is aware that if samples are needed to contact office considering PA has not been processed.

## 2016-01-22 NOTE — Telephone Encounter (Signed)
[employer] Rep Herbie [place] [phone]

## 2016-01-22 NOTE — Telephone Encounter (Signed)
Pt is calling stating she has called her pharmacy about her prescription on Eliquis States that we told her we would need to do a Prior Auth on this medication.  She states she did receive samples from our office, but wants to make sure we get this done as well.  She states she was told it only takes 72 hours to do this and get an approval  Please call back.

## 2016-01-28 ENCOUNTER — Other Ambulatory Visit: Payer: Self-pay | Admitting: *Deleted

## 2016-01-28 ENCOUNTER — Encounter: Payer: Self-pay | Admitting: *Deleted

## 2016-01-28 ENCOUNTER — Telehealth: Payer: Self-pay | Admitting: Cardiovascular Disease

## 2016-01-28 MED ORDER — APIXABAN 5 MG PO TABS: 5.0000 mg | ORAL_TABLET | Freq: Two times a day (BID) | ORAL | Status: DC

## 2016-01-28 NOTE — Telephone Encounter (Signed)
Requested Prescriptions   Signed Prescriptions Disp Refills  . apixaban (ELIQUIS) 5 MG TABS tablet 60 tablet 3    Sig: Take 1 tablet (5 mg total) by mouth 2 (two) times daily.    Authorizing Provider: GOLLAN, TIMOTHY J    Ordering User: LOPEZ, MARINA C    

## 2016-01-28 NOTE — Telephone Encounter (Signed)
PA has been faxed to [facility] awaiting response.

## 2016-01-28 NOTE — Telephone Encounter (Signed)
Pt is calling regarding Eliquis prescription. Please call.

## 2016-01-28 NOTE — Telephone Encounter (Signed)
Pt has been Approved for Eliquis 5 mg tablet start date 01/15/16 until further notice. SX#[id number]

## 2016-01-28 NOTE — Telephone Encounter (Signed)
I spoke with pt today who was upset due to not being able to get Eliquis samples from [place] due to her never getting a call. She is aware that we called her & left VM to notify her that we were working on her PA and we could get her samples until PA goes through. Pt never called the office back requesting the samples at the given time. She was demanding that someone pay her $40 dollars for the 6 pills she got from the pharmacy. She mentioned that the pharmacy made a deal with her and promised they would give her the $40 back once PA goes through. She is aware that the PA has not been approved at this time. I apologized to her for the inconvenience and mentioned to her that I will be glad to get her samples which she refused the samples because she now has them.

## 2016-02-26 ENCOUNTER — Telehealth: Payer: Self-pay | Admitting: Cardiovascular Disease

## 2016-02-26 NOTE — Telephone Encounter (Signed)
Patient wants to clarify medication dosage.  Patient takes atenolol and wants to know how to take it. Please call to clarify dosage and if this is a change.

## 2016-02-26 NOTE — Telephone Encounter (Signed)
Spoke w/ pt.  Clarified that pt's chart indicates that she is to take atenolol 25 mg 1/2 tab BID. She states that her PCP renewed the rx for whole pill once daily. Advised her to contact PCP's office to see if he changed the rx or if this was a mistake & needs to make sure her records are correct. She is appreciative and will call back w/ any further questions or concerns.

## 2016-03-15 ENCOUNTER — Other Ambulatory Visit: Payer: Self-pay

## 2016-03-15 DIAGNOSIS — I5032 Chronic diastolic (congestive) heart failure: Secondary | ICD-10-CM

## 2016-03-15 MED ORDER — FUROSEMIDE 40 MG PO TABS: 40.0000 mg | ORAL_TABLET | Freq: Every day | ORAL | Status: DC

## 2016-03-22 ENCOUNTER — Encounter (INDEPENDENT_AMBULATORY_CARE_PROVIDER_SITE_OTHER): Payer: Self-pay

## 2016-03-22 ENCOUNTER — Ambulatory Visit (INDEPENDENT_AMBULATORY_CARE_PROVIDER_SITE_OTHER): Payer: Medicare Other | Admitting: Cardiovascular Disease

## 2016-03-22 ENCOUNTER — Encounter: Payer: Self-pay | Admitting: Cardiovascular Disease

## 2016-03-22 VITALS — BP 120/60 | HR 95 | Ht 60.0 in | Wt 210.0 lb

## 2016-03-22 DIAGNOSIS — R600 Localized edema: Secondary | ICD-10-CM

## 2016-03-22 DIAGNOSIS — I10 Essential (primary) hypertension: Secondary | ICD-10-CM

## 2016-03-22 DIAGNOSIS — I483 Typical atrial flutter: Secondary | ICD-10-CM

## 2016-03-22 DIAGNOSIS — I5032 Chronic diastolic (congestive) heart failure: Secondary | ICD-10-CM

## 2016-03-22 DIAGNOSIS — R0602 Shortness of breath: Secondary | ICD-10-CM

## 2016-03-22 MED ORDER — FUROSEMIDE 40 MG PO TABS: 40.0000 mg | ORAL_TABLET | Freq: Two times a day (BID) | ORAL | 3 refills | Status: DC

## 2016-03-22 NOTE — Progress Notes (Signed)
Cardiology Office Note  Date:  03/22/2016   ID:  Luva Metzger, DOB 1956/05/01, MRN [id number]  PCP:  Donnie Coffin, MD   Chief Complaint  Patient presents with  . Other    2 month f/u. Meds reviewed verbally with pt.    HPI:  60 y/o woman with history of  DM and HTN,  two TIA's and a stroke (2007 - [place]), s/p peripheral arterial bypass on the RLE in [place], [place] in 2004, chronic RLE edema, chronic DOE who presents for follow-up of her atrial flutter   Atrial flutter started April 2017  stress testing  prior to her LE bypass in 2004 reportedly nl.  In follow-up today, she reports that her leg edema has improved On her last clinic visit we held her calcium channel blocker with improvement of her symptoms though still with right greater than left swelling. Edema has been a chronic issue She has high fluid intake daily, water and unsweetened iced tea Reports having overactive bladder  Weight is down 6 pounds from her prior clinic visit Previous weight was 216  Denies any significant shortness of breath but she is not free active at baseline, no regular exercise program  Lab work reviewed with her showing hemoglobin A1c 9.2, total cholesterol 171, LDL 70 She is taking Lipitor 80 mg daily Again does not want cardioversion to restore normal sinus rhythm Tolerating anticoagulation  EKG on today's visit shows atrial flutter with ventricular rate 95 bpm,   Other past medical history  she had worsened DOE, family brought her into the [facility] ED on the afternoon of 12/11/15 noted to be in atrial flutter, which was rate-controlled in the 80's.  eliquis 5 bid given CHA2DS2VASc of 7.    d/c'd on 4/15 @ a wt of 215 lbs.    PMH:   has a past medical history of Atrial flutter ([place]); Cancer ([place]); Chronic diastolic CHF (congestive heart failure) ([place]); Chronic Dyspnea on Exertion; Diabetes mellitus without complication ([place]); Essential hypertension; Morbid obesity ([place]); PVC's (premature  ventricular contractions); PVD (peripheral vascular disease) ([place]); and Stroke ([place]).  PSH:    Past Surgical History:  Procedure Laterality Date  . Cataract Surgery Right    03/2014  . CESAREAN SECTION    . LEG SURGERY      Current Outpatient Prescriptions  Medication Sig Dispense Refill  . apixaban (ELIQUIS) 5 MG TABS tablet Take 1 tablet (5 mg total) by mouth 2 (two) times daily. 60 tablet 3  . atenolol (TENORMIN) 25 MG tablet Take 0.5 tablets (12.5 mg total) by mouth 2 (two) times daily. 60 tablet 0  . cloNIDine (CATAPRES) 0.1 MG tablet Take 1 tablet (0.1 mg total) by mouth 2 (two) times daily. 60 tablet 11  . furosemide (LASIX) 40 MG tablet Take 1 tablet (40 mg total) by mouth 2 (two) times daily. 180 tablet 3  . glipiZIDE (GLUCOTROL XL) 10 MG 24 hr tablet Take 10 mg by mouth daily as needed. Patient takes with levemir when blood sugar is high    . insulin aspart (NOVOLOG) 100 UNIT/ML injection Inject 3 Units into the skin 3 (three) times daily before meals.    . insulin detemir (LEVEMIR) 100 UNIT/ML injection Inject 30 Units into the skin at bedtime.    Marland Kitchen lisinopril (PRINIVIL,ZESTRIL) 40 MG tablet Take 40 mg by mouth daily.    . metFORMIN (GLUCOPHAGE) 500 MG tablet Take 1,000 mg by mouth 2 (two) times daily.     . Multiple Vitamin (MULTIVITAMIN  WITH MINERALS) TABS tablet Take 1 tablet by mouth daily.    Marland Kitchen omeprazole (PRILOSEC) 20 MG capsule Take 20 mg by mouth daily.    Marland Kitchen oxybutynin (DITROPAN) 5 MG tablet Take 5 mg by mouth 2 (two) times daily.    Marland Kitchen spironolactone (ALDACTONE) 25 MG tablet Take 25 mg by mouth daily.    . VENTOLIN HFA 108 (90 Base) MCG/ACT inhaler Inhale 2 puffs into the lungs every 6 (six) hours as needed.     Marland Kitchen atorvastatin (LIPITOR) 80 MG tablet Take 80 mg by mouth daily at 6 PM.      No current facility-administered medications for this visit.      Allergies:   Review of patient's allergies indicates no known allergies.   Social History:  The patient  reports  that she has quit smoking. She does not have any smokeless tobacco history on file. She reports that she does not drink alcohol.   Family History:   family history includes Cancer in her brother and mother; Heart attack in her father.    Review of Systems: Review of Systems  Constitutional: Negative.   Respiratory: Positive for shortness of breath.   Cardiovascular: Positive for leg swelling.  Gastrointestinal: Negative.   Musculoskeletal:       Gait instability  Neurological: Negative.   Psychiatric/Behavioral: Negative.   All other systems reviewed and are negative.    PHYSICAL EXAM: VS:  BP 120/60 (BP Location: Left Arm, Patient Position: Sitting, Cuff Size: Large)   Pulse 95   Ht 5' (1.524 m)   Wt 210 lb (95.3 kg)   BMI 41.01 kg/m  , BMI Body mass index is 41.01 kg/m. GEN: Well nourished, well developed, in no acute distress, obese  HEENT: normal  Neck: no JVD, carotid bruits, or masses Cardiac: RRR; no murmurs, rubs, or gallops, 1+ pitting edema worse on the right than the left lower extremity Respiratory:  clear to auscultation bilaterally, normal work of breathing GI: soft, nontender, nondistended, + BS MS: no deformity or atrophy  Skin: warm and dry, no rash Neuro:  Strength and sensation are intact Psych: euthymic mood, full affect    Recent Labs: 12/11/2015: B Natriuretic Peptide 362.0; Hemoglobin 13.2; Platelets 305 12/12/2015: TSH 3.108 12/13/2015: Magnesium 1.3 12/17/2015: BUN 39; Creatinine, Ser 1.23; Potassium 4.1; Sodium 138    Lipid Panel Lab Results  Component Value Date   CHOL 171 04/04/2014   HDL 31 (L) 04/04/2014   LDLCALC 70 04/04/2014   TRIG 351 (H) 04/04/2014      Wt Readings from Last 3 Encounters:  03/22/16 210 lb (95.3 kg)  01/19/16 216 lb 12 oz (98.3 kg)  12/17/15 215 lb (97.5 kg)       ASSESSMENT AND PLAN:  Typical atrial flutter ([place]) - Plan: EKG 12-Lead Rate adequately controlled in the 90 range, tolerating  anticoagulation Does not want cardioversion  Chronic diastolic HF (heart failure) ([facility]) - Plan: furosemide (LASIX) 40 MG tablet Recommended she increase Lasix up to 60 mg in the morning, try to cut back on her fluid intake Weight is down 6 pounds from her prior clinic visit by holding calcium channel blocker  Essential hypertension Blood pressure is well controlled on today's visit. No changes made to the medications.  Bilateral leg edema Pitting edema on today's visit worse on the right than the left. Suspect secondary to acute on chronic diastolic CHF Long discussion concerning CHF management, monitoring weight mild decreasing fluid intake Encouraged that her weight is down  SOB (shortness of breath)  she denies significant shortness of breath on exertion though not very active at baseline Diuretic regiment changes as above  Morbid obesity due to excess calories ([place]) We have encouraged continued exercise, careful diet management in an effort to lose weight.    Total encounter time more than 25 minutes  Greater than 50% was spent in counseling and coordination of care with the patient    Disposition:   F/U  6 months   Orders Placed This Encounter  Procedures  . EKG 12-Lead     Signed, Esmond Plants, M.D., Ph.D. 03/22/2016  [place], [place]

## 2016-03-22 NOTE — Patient Instructions (Addendum)
Medication Instructions:   Please increase the lasix up to 1 1/2 pills daily Don't drink too much fluids   Ace wrap leg on the right when bad swelling  Leg elevation  Labwork:  No new labs  Testing/Procedures:  No new testing  Follow-Up: It was a pleasure seeing you in the office today. Please call us if you have new issues that need to be addressed before your next appt.  [phone]  Your physician wants you to follow-up in: 6 months.  You will receive a reminder letter in the mail two months in advance. If you don't receive a letter, please call our office to schedule the follow-up appointment.  If you need a refill on your cardiac medications before your next appointment, please call your pharmacy.

## 2016-07-13 ENCOUNTER — Emergency Department
Admission: EM | Admit: 2016-07-13 | Discharge: 2016-07-13 | Disposition: A | Discharge status: Home / Self Care | Payer: Medicare Other | Attending: Emergency Medicine | Admitting: Emergency Medicine

## 2016-07-13 ENCOUNTER — Encounter: Payer: Self-pay | Admitting: Emergency Medicine

## 2016-07-13 ENCOUNTER — Emergency Department: Payer: Medicare Other

## 2016-07-13 DIAGNOSIS — Z794 Long term (current) use of insulin: Secondary | ICD-10-CM | POA: Diagnosis not present

## 2016-07-13 DIAGNOSIS — Z859 Personal history of malignant neoplasm, unspecified: Secondary | ICD-10-CM | POA: Diagnosis not present

## 2016-07-13 DIAGNOSIS — Z79899 Other long term (current) drug therapy: Secondary | ICD-10-CM | POA: Insufficient documentation

## 2016-07-13 DIAGNOSIS — Z87891 Personal history of nicotine dependence: Secondary | ICD-10-CM | POA: Insufficient documentation

## 2016-07-13 DIAGNOSIS — I5032 Chronic diastolic (congestive) heart failure: Secondary | ICD-10-CM | POA: Insufficient documentation

## 2016-07-13 DIAGNOSIS — I110 Hypertensive heart disease with heart failure: Secondary | ICD-10-CM | POA: Insufficient documentation

## 2016-07-13 DIAGNOSIS — G5791 Unspecified mononeuropathy of right lower limb: Secondary | ICD-10-CM | POA: Diagnosis not present

## 2016-07-13 DIAGNOSIS — M79671 Pain in right foot: Secondary | ICD-10-CM | POA: Diagnosis present

## 2016-07-13 DIAGNOSIS — E119 Type 2 diabetes mellitus without complications: Secondary | ICD-10-CM | POA: Insufficient documentation

## 2016-07-13 MED ORDER — GABAPENTIN 100 MG PO CAPS: 100.0000 mg | ORAL_CAPSULE | Freq: Every day | ORAL | 0 refills | Status: DC

## 2016-07-13 NOTE — ED Triage Notes (Signed)
Pt to ed with c/o right foot pain in arch of foot and radiating into toes.  Pt denies injury.  Also notes swelling in right foot.

## 2016-07-13 NOTE — ED Provider Notes (Signed)
[facility] Emergency Department Provider Note ____________________________________________  Time seen: Approximately 1:44 PM  I have reviewed the triage vital signs and the nursing notes.   HISTORY  Chief Complaint Foot Pain    HPI Megan Cisneros is a 60 y.o. female who presents to the emergency department for evaluation of right foot pain. She denies specific injury. Pain started yesterday and is described as a deep cramp with occasional sharp, shooting pain. She feels this is neuropathic pain. She has a history of "bad circulation" in both LE and states that the color and amount of edema has not worsened since onset of symptoms. She had "normal" labs at her PCP's office last Thursday. Blood sugar was 111 this morning and A1c was 6.5 according to Thursday's labs. She has not taken anything for pain.   Past Medical History:  Diagnosis Date  . Atrial flutter ([place])    a. Dx 11/2015 -->CHA2DS2VASc = 7-->Eliquis '5mg'$  BID.  Marland Kitchen Cancer ([place])   . Chronic diastolic CHF (congestive heart failure) ([place])    a. 11/2015 Echo: EF 60-65%, no rwma, mild MR, mildly dil LA, nl RV fxn.  . Chronic Dyspnea on Exertion    a. 2004 Stress test prior to LE bypass->reportedly nl.  . Diabetes mellitus without complication ([place])    a. Dx as borderline diabetic in the 80's-->progressed over the years.  . Essential hypertension   . Morbid obesity ([place])   . PVC's (premature ventricular contractions)   . PVD (peripheral vascular disease) ([place])    a. 2004 s/p RLE bypass - complicated by recurrent infection.  . Stroke [facility])    a. TIA x 2;  b. Stroke in 2007 in [place].    Patient Active Problem List   Diagnosis Date Noted  . Essential hypertension 01/19/2016  . Acute on chronic diastolic CHF (congestive heart failure) ([place])   . Shortness of breath   . Typical atrial flutter ([place])   . Bilateral leg edema   . SOB (shortness of breath)   . Morbid obesity due to excess calories ([place])   .  CHF (congestive heart failure) ([place]) 12/11/2015    Past Surgical History:  Procedure Laterality Date  . Cataract Surgery Right    03/2014  . CESAREAN SECTION    . LEG SURGERY      Prior to Admission medications   Medication Sig Start Date End Date Taking? Authorizing Provider  apixaban (ELIQUIS) 5 MG TABS tablet Take 1 tablet (5 mg total) by mouth 2 (two) times daily. 01/28/16   Minna Merritts, MD  atenolol (TENORMIN) 25 MG tablet Take 0.5 tablets (12.5 mg total) by mouth 2 (two) times daily. 12/12/15   Hillary Bow, MD  atorvastatin (LIPITOR) 80 MG tablet Take 80 mg by mouth daily at 6 PM.  12/30/15   Historical Provider, MD  cloNIDine (CATAPRES) 0.1 MG tablet Take 1 tablet (0.1 mg total) by mouth 2 (two) times daily. 01/19/16   Minna Merritts, MD  furosemide (LASIX) 40 MG tablet Take 1 tablet (40 mg total) by mouth 2 (two) times daily. 03/22/16   Minna Merritts, MD  gabapentin (NEURONTIN) 100 MG capsule Take 1 capsule (100 mg total) by mouth daily. 07/13/16 07/13/17  Victorino Dike, FNP  glipiZIDE (GLUCOTROL XL) 10 MG 24 hr tablet Take 10 mg by mouth daily as needed. Patient takes with levemir when blood sugar is high    Historical Provider, MD  insulin aspart (NOVOLOG) 100 UNIT/ML injection Inject 3 Units  into the skin 3 (three) times daily before meals.    Historical Provider, MD  insulin detemir (LEVEMIR) 100 UNIT/ML injection Inject 30 Units into the skin at bedtime.    Historical Provider, MD  lisinopril (PRINIVIL,ZESTRIL) 40 MG tablet Take 40 mg by mouth daily.    Historical Provider, MD  metFORMIN (GLUCOPHAGE) 500 MG tablet Take 1,000 mg by mouth 2 (two) times daily.     Historical Provider, MD  Multiple Vitamin (MULTIVITAMIN WITH MINERALS) TABS tablet Take 1 tablet by mouth daily.    Historical Provider, MD  omeprazole (PRILOSEC) 20 MG capsule Take 20 mg by mouth daily.    Historical Provider, MD  oxybutynin (DITROPAN) 5 MG tablet Take 5 mg by mouth 2 (two) times daily.     Historical Provider, MD  spironolactone (ALDACTONE) 25 MG tablet Take 25 mg by mouth daily.    Historical Provider, MD  VENTOLIN HFA 108 (90 Base) MCG/ACT inhaler Inhale 2 puffs into the lungs every 6 (six) hours as needed.  12/13/15   Historical Provider, MD    Allergies Patient has no known allergies.  Family History  Problem Relation Age of Onset  . Heart attack Father     died in his early 24's.  . Cancer Mother     deceased  . Cancer Brother     multiple cancers, deceased.    Social History Social History  Substance Use Topics  . Smoking status: Former Research scientist (life sciences)  . Smokeless tobacco: Former Systems developer  . Alcohol use No    Review of Systems Constitutional: No recent illness. Cardiovascular: Denies chest pain or palpitations. Respiratory: Denies shortness of breath. Musculoskeletal: Pain in right foot. Skin: Negative for rash, wound, lesion. Neurological: Negative for focal weakness or numbness.  ____________________________________________   PHYSICAL EXAM:  VITAL SIGNS: ED Triage Vitals  Enc Vitals Group     BP 07/13/16 1321 138/74     Pulse Rate 07/13/16 1321 100     Resp 07/13/16 1321 18     Temp 07/13/16 1321 97.2 F (36.2 C)     Temp Source 07/13/16 1321 Oral     SpO2 07/13/16 1321 97 %     Weight 07/13/16 1322 193 lb (87.5 kg)     Height --      Head Circumference --      Peak Flow --      Pain Score 07/13/16 1323 8     Pain Loc --      Pain Edu? --      Excl. in [place]? --     Constitutional: Alert and oriented. Well appearing and in no acute distress. Eyes: Conjunctivae are normal. EOMI. Head: Atraumatic. Neck: No stridor.  Respiratory: Normal respiratory effort.   Musculoskeletal: Full active ROM of the right foot and ankle.  Neurologic:  Normal speech and language. No gross focal neurologic deficits are appreciated. Speech is normal. No gait instability. Skin:  Skin is warm, dry and intact. Atraumatic. Mildly erythematous and edematous, which is at  baseline. Psychiatric: Mood and affect are normal. Speech and behavior are normal.  ____________________________________________   LABS (all labs ordered are listed, but only abnormal results are displayed)  Labs Reviewed - No data to display ____________________________________________  RADIOLOGY  Negative for acute abnormality per radiology. ____________________________________________   PROCEDURES  Procedure(s) performed: None   ____________________________________________   INITIAL IMPRESSION / ASSESSMENT AND PLAN / ED COURSE  Clinical Course     Pertinent labs & imaging results that were available during  my care of the patient were reviewed by me and considered in my medical decision making (see chart for details).  Patient to start gabapentin and follow up with PCP if not improving. She uses a walker and cane at home and will continue. She was advised to return to the ER for symptoms that change or worsen if unable to schedule an appointment. ____________________________________________   FINAL CLINICAL IMPRESSION(S) / ED DIAGNOSES  Final diagnoses:  Neuropathy of right foot       Victorino Dike, FNP 07/13/16 1514    Delman Kitten, MD 07/16/16 1021

## 2016-08-05 ENCOUNTER — Emergency Department: Payer: Medicare Other

## 2016-08-05 ENCOUNTER — Encounter: Payer: Self-pay | Admitting: Internal Medicine

## 2016-08-05 ENCOUNTER — Inpatient Hospital Stay
Admission: EM | Admit: 2016-08-05 | Discharge: 2016-08-09 | DRG: 308 | Disposition: A | Discharge status: Home Health | Payer: Medicare Other | Attending: Internal Medicine | Admitting: Internal Medicine

## 2016-08-05 DIAGNOSIS — I4891 Unspecified atrial fibrillation: Secondary | ICD-10-CM

## 2016-08-05 DIAGNOSIS — Z87891 Personal history of nicotine dependence: Secondary | ICD-10-CM | POA: Diagnosis not present

## 2016-08-05 DIAGNOSIS — I10 Essential (primary) hypertension: Secondary | ICD-10-CM | POA: Diagnosis not present

## 2016-08-05 DIAGNOSIS — Z794 Long term (current) use of insulin: Secondary | ICD-10-CM

## 2016-08-05 DIAGNOSIS — R21 Rash and other nonspecific skin eruption: Secondary | ICD-10-CM | POA: Diagnosis present

## 2016-08-05 DIAGNOSIS — L03116 Cellulitis of left lower limb: Secondary | ICD-10-CM

## 2016-08-05 DIAGNOSIS — I4892 Unspecified atrial flutter: Secondary | ICD-10-CM | POA: Diagnosis present

## 2016-08-05 DIAGNOSIS — B354 Tinea corporis: Secondary | ICD-10-CM

## 2016-08-05 DIAGNOSIS — R600 Localized edema: Secondary | ICD-10-CM | POA: Diagnosis not present

## 2016-08-05 DIAGNOSIS — Z800 Family history of malignant neoplasm of digestive organs: Secondary | ICD-10-CM | POA: Diagnosis not present

## 2016-08-05 DIAGNOSIS — Z833 Family history of diabetes mellitus: Secondary | ICD-10-CM

## 2016-08-05 DIAGNOSIS — Z6835 Body mass index (BMI) 35.0-35.9, adult: Secondary | ICD-10-CM

## 2016-08-05 DIAGNOSIS — R627 Adult failure to thrive: Secondary | ICD-10-CM | POA: Diagnosis present

## 2016-08-05 DIAGNOSIS — R0789 Other chest pain: Secondary | ICD-10-CM | POA: Diagnosis present

## 2016-08-05 DIAGNOSIS — Z853 Personal history of malignant neoplasm of breast: Secondary | ICD-10-CM

## 2016-08-05 DIAGNOSIS — I482 Chronic atrial fibrillation: Principal | ICD-10-CM | POA: Diagnosis present

## 2016-08-05 DIAGNOSIS — N182 Chronic kidney disease, stage 2 (mild): Secondary | ICD-10-CM | POA: Diagnosis present

## 2016-08-05 DIAGNOSIS — L03115 Cellulitis of right lower limb: Secondary | ICD-10-CM | POA: Diagnosis present

## 2016-08-05 DIAGNOSIS — Z806 Family history of leukemia: Secondary | ICD-10-CM

## 2016-08-05 DIAGNOSIS — I509 Heart failure, unspecified: Secondary | ICD-10-CM | POA: Diagnosis not present

## 2016-08-05 DIAGNOSIS — R2681 Unsteadiness on feet: Secondary | ICD-10-CM

## 2016-08-05 DIAGNOSIS — N179 Acute kidney failure, unspecified: Secondary | ICD-10-CM | POA: Diagnosis present

## 2016-08-05 DIAGNOSIS — Z9012 Acquired absence of left breast and nipple: Secondary | ICD-10-CM

## 2016-08-05 DIAGNOSIS — Z8249 Family history of ischemic heart disease and other diseases of the circulatory system: Secondary | ICD-10-CM | POA: Diagnosis not present

## 2016-08-05 DIAGNOSIS — E876 Hypokalemia: Secondary | ICD-10-CM | POA: Diagnosis not present

## 2016-08-05 DIAGNOSIS — R079 Chest pain, unspecified: Secondary | ICD-10-CM

## 2016-08-05 DIAGNOSIS — M549 Dorsalgia, unspecified: Secondary | ICD-10-CM | POA: Diagnosis present

## 2016-08-05 DIAGNOSIS — I5033 Acute on chronic diastolic (congestive) heart failure: Secondary | ICD-10-CM | POA: Diagnosis present

## 2016-08-05 DIAGNOSIS — E1122 Type 2 diabetes mellitus with diabetic chronic kidney disease: Secondary | ICD-10-CM | POA: Diagnosis present

## 2016-08-05 DIAGNOSIS — E1151 Type 2 diabetes mellitus with diabetic peripheral angiopathy without gangrene: Secondary | ICD-10-CM | POA: Diagnosis present

## 2016-08-05 DIAGNOSIS — Z8673 Personal history of transient ischemic attack (TIA), and cerebral infarction without residual deficits: Secondary | ICD-10-CM | POA: Diagnosis not present

## 2016-08-05 DIAGNOSIS — N390 Urinary tract infection, site not specified: Secondary | ICD-10-CM

## 2016-08-05 DIAGNOSIS — M6281 Muscle weakness (generalized): Secondary | ICD-10-CM

## 2016-08-05 DIAGNOSIS — Z79899 Other long term (current) drug therapy: Secondary | ICD-10-CM

## 2016-08-05 DIAGNOSIS — Z7901 Long term (current) use of anticoagulants: Secondary | ICD-10-CM

## 2016-08-05 DIAGNOSIS — I130 Hypertensive heart and chronic kidney disease with heart failure and stage 1 through stage 4 chronic kidney disease, or unspecified chronic kidney disease: Secondary | ICD-10-CM | POA: Diagnosis present

## 2016-08-05 DIAGNOSIS — R0600 Dyspnea, unspecified: Secondary | ICD-10-CM

## 2016-08-05 HISTORY — DX: Localized edema: R60.0

## 2016-08-05 HISTORY — DX: Malignant neoplasm of unspecified site of unspecified female breast: C50.919

## 2016-08-05 LAB — URINALYSIS, COMPLETE (UACMP) WITH MICROSCOPIC
Bilirubin Urine: NEGATIVE
GLUCOSE, UA: NEGATIVE mg/dL
Ketones, ur: 5 mg/dL — AB
Nitrite: POSITIVE — AB
PROTEIN: 100 mg/dL — AB
Specific Gravity, Urine: 1.015 (ref 1.005–1.030)
pH: 5 (ref 5.0–8.0)

## 2016-08-05 LAB — CBC
HEMATOCRIT: 39.8 % (ref 35.0–47.0)
Hemoglobin: 13.2 g/dL (ref 12.0–16.0)
MCH: 28.6 pg (ref 26.0–34.0)
MCHC: 33.1 g/dL (ref 32.0–36.0)
MCV: 86.4 fL (ref 80.0–100.0)
Platelets: 284 10*3/uL (ref 150–440)
RBC: 4.6 MIL/uL (ref 3.80–5.20)
RDW: 16.3 % — AB (ref 11.5–14.5)
WBC: 7.7 10*3/uL (ref 3.6–11.0)

## 2016-08-05 LAB — TROPONIN I: Troponin I: <0.03 ng/mL (ref <0.03)

## 2016-08-05 LAB — BASIC METABOLIC PANEL
Anion gap: 13 (ref 5–15)
BUN: 43 mg/dL — AB (ref 6–20)
CALCIUM: 8.9 mg/dL (ref 8.9–10.3)
CO2: 31 mmol/L (ref 22–32)
Chloride: 95 mmol/L — ABNORMAL LOW (ref 101–111)
Creatinine, Ser: 1.24 mg/dL — ABNORMAL HIGH (ref 0.44–1.00)
GFR calc Af Amer: 53 mL/min — ABNORMAL LOW (ref >60)
GFR, EST NON AFRICAN AMERICAN: 46 mL/min — AB (ref >60)
GLUCOSE: 178 mg/dL — AB (ref 65–99)
Potassium: 3.7 mmol/L (ref 3.5–5.1)
Sodium: 139 mmol/L (ref 135–145)

## 2016-08-05 LAB — MAGNESIUM: MAGNESIUM: 1 mg/dL — AB (ref 1.7–2.4)

## 2016-08-05 LAB — GLUCOSE, CAPILLARY
GLUCOSE-CAPILLARY: 219 mg/dL — AB (ref 65–99)
GLUCOSE-CAPILLARY: 211 mg/dL — AB (ref 65–99)

## 2016-08-05 LAB — BRAIN NATRIURETIC PEPTIDE: B NATRIURETIC PEPTIDE 5: 355 pg/mL — AB (ref 0.0–100.0)

## 2016-08-05 LAB — TSH: TSH: 3.136 u[IU]/mL (ref 0.350–4.500)

## 2016-08-05 MED ORDER — CLONIDINE HCL 0.1 MG PO TABS
0.1000 mg | ORAL_TABLET | Freq: Two times a day (BID) | ORAL | Status: DC
  Administered 2016-08-05: 0.1 mg via ORAL
  Filled 2016-08-05: qty 1

## 2016-08-05 MED ORDER — SPIRONOLACTONE 25 MG PO TABS
25.0000 mg | ORAL_TABLET | Freq: Every day | ORAL | Status: DC
  Administered 2016-08-05: 25 mg via ORAL
  Filled 2016-08-05: qty 1

## 2016-08-05 MED ORDER — IPRATROPIUM-ALBUTEROL 0.5-2.5 (3) MG/3ML IN SOLN
3.0000 mL | Freq: Once | RESPIRATORY_TRACT | Status: AC
  Administered 2016-08-05: 3 mL via RESPIRATORY_TRACT
  Filled 2016-08-05: qty 3

## 2016-08-05 MED ORDER — METOPROLOL TARTRATE 25 MG PO TABS: 12.5000 mg | ORAL_TABLET | Freq: Four times a day (QID) | ORAL | Status: DC

## 2016-08-05 MED ORDER — DIGOXIN 250 MCG PO TABS
0.2500 mg | ORAL_TABLET | Freq: Every day | ORAL | Status: DC
  Administered 2016-08-05 – 2016-08-07 (×3): 0.25 mg via ORAL
  Filled 2016-08-05 (×3): qty 1

## 2016-08-05 MED ORDER — ONDANSETRON HCL 4 MG PO TABS: 4.0000 mg | ORAL_TABLET | Freq: Four times a day (QID) | ORAL | Status: DC | PRN

## 2016-08-05 MED ORDER — POTASSIUM CHLORIDE CRYS ER 20 MEQ PO TBCR
20.0000 meq | EXTENDED_RELEASE_TABLET | Freq: Two times a day (BID) | ORAL | Status: DC
  Administered 2016-08-06 (×2): 20 meq via ORAL
  Filled 2016-08-05 (×2): qty 1

## 2016-08-05 MED ORDER — APIXABAN 5 MG PO TABS
5.0000 mg | ORAL_TABLET | Freq: Two times a day (BID) | ORAL | Status: DC
  Administered 2016-08-05 – 2016-08-09 (×9): 5 mg via ORAL
  Filled 2016-08-05 (×9): qty 1

## 2016-08-05 MED ORDER — HYDROCODONE-ACETAMINOPHEN 5-325 MG PO TABS
1.0000 | ORAL_TABLET | Freq: Four times a day (QID) | ORAL | Status: DC | PRN
  Administered 2016-08-05 – 2016-08-07 (×3): 2 via ORAL
  Administered 2016-08-08: 1 via ORAL
  Filled 2016-08-05: qty 1
  Filled 2016-08-05 (×3): qty 2

## 2016-08-05 MED ORDER — FUROSEMIDE 10 MG/ML IJ SOLN: 40.0000 mg | Freq: Once | INTRAMUSCULAR | Status: DC

## 2016-08-05 MED ORDER — TRIAMCINOLONE ACETONIDE 0.5 % EX OINT
TOPICAL_OINTMENT | Freq: Two times a day (BID) | CUTANEOUS | Status: DC
  Administered 2016-08-05 – 2016-08-09 (×8): via TOPICAL
  Filled 2016-08-05 (×3): qty 15

## 2016-08-05 MED ORDER — ACETAMINOPHEN 325 MG PO TABS: 650.0000 mg | ORAL_TABLET | Freq: Four times a day (QID) | ORAL | Status: DC | PRN

## 2016-08-05 MED ORDER — INSULIN ASPART 100 UNIT/ML ~~LOC~~ SOLN
0.0000 [IU] | Freq: Every day | SUBCUTANEOUS | Status: DC
  Administered 2016-08-05: 2 [IU] via SUBCUTANEOUS
  Administered 2016-08-08: 3 [IU] via SUBCUTANEOUS
  Filled 2016-08-05: qty 3
  Filled 2016-08-05: qty 2

## 2016-08-05 MED ORDER — SODIUM CHLORIDE 0.9% FLUSH
3.0000 mL | Freq: Two times a day (BID) | INTRAVENOUS | Status: DC
Start: 2016-08-05 — End: 2016-08-09
  Administered 2016-08-05 – 2016-08-09 (×9): 3 mL via INTRAVENOUS

## 2016-08-05 MED ORDER — PANTOPRAZOLE SODIUM 40 MG PO TBEC
40.0000 mg | DELAYED_RELEASE_TABLET | Freq: Every day | ORAL | Status: DC
  Administered 2016-08-06 – 2016-08-09 (×4): 40 mg via ORAL
  Filled 2016-08-05 (×4): qty 1

## 2016-08-05 MED ORDER — POLYETHYLENE GLYCOL 3350 17 G PO PACK: 17.0000 g | PACK | Freq: Every day | ORAL | Status: DC | PRN

## 2016-08-05 MED ORDER — INSULIN ASPART 100 UNIT/ML ~~LOC~~ SOLN
0.0000 [IU] | Freq: Three times a day (TID) | SUBCUTANEOUS | Status: DC
  Administered 2016-08-05: 3 [IU] via SUBCUTANEOUS
  Administered 2016-08-06: 2 [IU] via SUBCUTANEOUS
  Administered 2016-08-06: 1 [IU] via SUBCUTANEOUS
  Administered 2016-08-07: 2 [IU] via SUBCUTANEOUS
  Administered 2016-08-07 (×2): 1 [IU] via SUBCUTANEOUS
  Administered 2016-08-08: 3 [IU] via SUBCUTANEOUS
  Administered 2016-08-08 – 2016-08-09 (×2): 2 [IU] via SUBCUTANEOUS
  Filled 2016-08-05: qty 1
  Filled 2016-08-05: qty 3
  Filled 2016-08-05 (×3): qty 1
  Filled 2016-08-05 (×2): qty 2
  Filled 2016-08-05: qty 3
  Filled 2016-08-05 (×2): qty 2

## 2016-08-05 MED ORDER — METOPROLOL TARTRATE 25 MG PO TABS
25.0000 mg | ORAL_TABLET | Freq: Four times a day (QID) | ORAL | Status: DC
  Administered 2016-08-05 – 2016-08-06 (×2): 25 mg via ORAL
  Filled 2016-08-05 (×2): qty 1

## 2016-08-05 MED ORDER — MAGNESIUM SULFATE 4 GM/100ML IV SOLN
4.0000 g | Freq: Once | INTRAVENOUS | Status: AC
  Administered 2016-08-05: 4 g via INTRAVENOUS
  Filled 2016-08-05: qty 100

## 2016-08-05 MED ORDER — OXYBUTYNIN CHLORIDE 5 MG PO TABS
5.0000 mg | ORAL_TABLET | Freq: Two times a day (BID) | ORAL | Status: DC
  Administered 2016-08-05 – 2016-08-09 (×8): 5 mg via ORAL
  Filled 2016-08-05 (×8): qty 1

## 2016-08-05 MED ORDER — LISINOPRIL 20 MG PO TABS
40.0000 mg | ORAL_TABLET | Freq: Every day | ORAL | Status: DC
  Administered 2016-08-05: 40 mg via ORAL
  Filled 2016-08-05: qty 2

## 2016-08-05 MED ORDER — DOCUSATE SODIUM 100 MG PO CAPS
100.0000 mg | ORAL_CAPSULE | Freq: Two times a day (BID) | ORAL | Status: DC
  Administered 2016-08-05 – 2016-08-09 (×8): 100 mg via ORAL
  Filled 2016-08-05 (×8): qty 1

## 2016-08-05 MED ORDER — FUROSEMIDE 10 MG/ML IJ SOLN
40.0000 mg | Freq: Two times a day (BID) | INTRAMUSCULAR | Status: DC
  Administered 2016-08-05 – 2016-08-06 (×2): 40 mg via INTRAVENOUS
  Filled 2016-08-05 (×2): qty 4

## 2016-08-05 MED ORDER — ATENOLOL 25 MG PO TABS: 12.5000 mg | ORAL_TABLET | Freq: Two times a day (BID) | ORAL | Status: DC

## 2016-08-05 MED ORDER — DILTIAZEM HCL 100 MG IV SOLR
5.0000 mg/h | INTRAVENOUS | Status: DC
  Administered 2016-08-05: 10 mg/h via INTRAVENOUS
  Administered 2016-08-05: 5 mg/h via INTRAVENOUS
  Administered 2016-08-06: 10 mg/h via INTRAVENOUS
  Filled 2016-08-05 (×3): qty 100

## 2016-08-05 MED ORDER — ATORVASTATIN CALCIUM 20 MG PO TABS
80.0000 mg | ORAL_TABLET | Freq: Every day | ORAL | Status: DC
  Administered 2016-08-05 – 2016-08-08 (×4): 80 mg via ORAL
  Filled 2016-08-05 (×4): qty 4

## 2016-08-05 MED ORDER — METFORMIN HCL 500 MG PO TABS
1000.0000 mg | ORAL_TABLET | Freq: Two times a day (BID) | ORAL | Status: DC
  Administered 2016-08-05 – 2016-08-06 (×2): 1000 mg via ORAL
  Filled 2016-08-05 (×2): qty 2

## 2016-08-05 MED ORDER — DILTIAZEM HCL 25 MG/5ML IV SOLN
10.0000 mg | Freq: Once | INTRAVENOUS | Status: AC
  Administered 2016-08-05: 10 mg via INTRAVENOUS

## 2016-08-05 MED ORDER — CLINDAMYCIN PHOSPHATE 600 MG/50ML IV SOLN
600.0000 mg | Freq: Once | INTRAVENOUS | Status: AC
Start: 2016-08-05 — End: 2016-08-05
  Administered 2016-08-05: 600 mg via INTRAVENOUS
  Filled 2016-08-05: qty 50

## 2016-08-05 MED ORDER — DILTIAZEM HCL 25 MG/5ML IV SOLN
10.0000 mg | Freq: Once | INTRAVENOUS | Status: AC
  Administered 2016-08-05: 10 mg via INTRAVENOUS
  Filled 2016-08-05: qty 5

## 2016-08-05 MED ORDER — TRIAMCINOLONE ACETONIDE 0.5 % EX CREA
TOPICAL_CREAM | Freq: Two times a day (BID) | CUTANEOUS | Status: DC
  Filled 2016-08-05: qty 15

## 2016-08-05 MED ORDER — DILTIAZEM HCL 25 MG/5ML IV SOLN
INTRAVENOUS | Status: AC
  Filled 2016-08-05: qty 5

## 2016-08-05 MED ORDER — ONDANSETRON HCL 4 MG/2ML IJ SOLN: 4.0000 mg | Freq: Four times a day (QID) | INTRAMUSCULAR | Status: DC | PRN

## 2016-08-05 MED ORDER — ADULT MULTIVITAMIN W/MINERALS CH
1.0000 | ORAL_TABLET | Freq: Every day | ORAL | Status: DC
  Administered 2016-08-06 – 2016-08-09 (×4): 1 via ORAL
  Filled 2016-08-05 (×4): qty 1

## 2016-08-05 MED ORDER — FENTANYL CITRATE (PF) 100 MCG/2ML IJ SOLN
50.0000 ug | Freq: Once | INTRAMUSCULAR | Status: AC
  Administered 2016-08-05: 50 ug via INTRAVENOUS
  Filled 2016-08-05: qty 2

## 2016-08-05 MED ORDER — INSULIN DETEMIR 100 UNIT/ML ~~LOC~~ SOLN
10.0000 [IU] | Freq: Every day | SUBCUTANEOUS | Status: DC
  Administered 2016-08-05 – 2016-08-07 (×3): 10 [IU] via SUBCUTANEOUS
  Filled 2016-08-05 (×5): qty 0.1

## 2016-08-05 MED ORDER — DILTIAZEM HCL 25 MG/5ML IV SOLN
15.0000 mg | Freq: Once | INTRAVENOUS | Status: AC
Start: 2016-08-05 — End: 2016-08-05
  Administered 2016-08-05: 15 mg via INTRAVENOUS
  Filled 2016-08-05: qty 5

## 2016-08-05 MED ORDER — ACETAMINOPHEN 650 MG RE SUPP: 650.0000 mg | Freq: Four times a day (QID) | RECTAL | Status: DC | PRN

## 2016-08-05 MED ORDER — GABAPENTIN 100 MG PO CAPS
100.0000 mg | ORAL_CAPSULE | Freq: Every day | ORAL | Status: DC
  Administered 2016-08-05 – 2016-08-09 (×5): 100 mg via ORAL
  Filled 2016-08-05 (×5): qty 1

## 2016-08-05 MED ORDER — POTASSIUM CHLORIDE CRYS ER 10 MEQ PO TBCR
30.0000 meq | EXTENDED_RELEASE_TABLET | Freq: Once | ORAL | Status: AC
  Administered 2016-08-05: 30 meq via ORAL
  Filled 2016-08-05: qty 1

## 2016-08-05 NOTE — Progress Notes (Signed)
PT HoldNote  Patient Details Name: Megan Cisneros MRN: [id number] DOB: 10-07-1955   Cancelled Treatment:    Reason Eval/Treat Not Completed: Medical issues which prohibited therapy. Chart reviewed and RN consulted. Patient with a-flutter currently with heart rates increasing into the 150s with minimal movement per RN. RN about start Cardizem drip upon arrival to unit. Patient currently not appropriate for physical therapy evaluation. Will perform PT evaluation with patient on next date as pt is appropriate.   Lyndel Safe Huprich PT, DPT   Huprich,Jason 08/05/2016, 4:16 PM

## 2016-08-05 NOTE — ED Triage Notes (Signed)
Patient presents to the ED with chest pain and shortness of breath.  Patient states, "I feel like an elephant is sitting on my chest."  Patient also reports shortness of breath.  Patient was diagnosed with afib in April.  Patient states chest pain began yesterday evening and she had difficulty sleeping last night.

## 2016-08-05 NOTE — ED Provider Notes (Signed)
[facility] Emergency Department Provider Note  ____________________________________________   I have reviewed the triage vital signs and the nursing notes.   HISTORY  Chief Complaint Chest Pain    HPI Jesseca Marsch is a 60 y.o. female who presents today complaining of multiple different issues. Patient states that she's been having dyspnea on exertion. Patient does have a history of atrial flutter, she did not take her medication today because she "panicked". She states that she is having left-sided chest pain. She states that she hadchiropractic manipulation with "the adjuster" and the pain has been there for one week. Worse when she changes position or touches it. Patient has also had increasing shortness of breath over the last week. She does have a history of diastolic heart failure. In addition, patient notes that she had a "cat" on her left lower extremity and has had a cellulitic rash there for the last few days and she is taking "an antibiotic" that she had at the house for this but it persists. She denies fever. She does have a wheeze, which is worse than normal as well. Patient states she was given albuterol but she doesn't like the taste so she never used it. The pain in her chest is sharp, focal, worse when she changes position or moves, does not appear to be exertional.    Past Medical History:  Diagnosis Date  . Atrial flutter ([place])    a. Dx 11/2015 -->CHA2DS2VASc = 7-->Eliquis '5mg'$  BID.  Marland Kitchen Cancer ([place])   . Chronic diastolic CHF (congestive heart failure) ([place])    a. 11/2015 Echo: EF 60-65%, no rwma, mild MR, mildly dil LA, nl RV fxn.  . Chronic Dyspnea on Exertion    a. 2004 Stress test prior to LE bypass->reportedly nl.  . Diabetes mellitus without complication ([employer])    a. Dx as borderline diabetic in the 80's-->progressed over the years.  . Essential hypertension   . Morbid obesity ([place])   . PVC's (premature ventricular contractions)   . PVD  (peripheral vascular disease) ([place])    a. 2004 s/p RLE bypass - complicated by recurrent infection.  . Stroke [facility])    a. TIA x 2;  b. Stroke in 2007 in [place].    Patient Active Problem List   Diagnosis Date Noted  . Essential hypertension 01/19/2016  . Acute on chronic diastolic CHF (congestive heart failure) ([place])   . Shortness of breath   . Typical atrial flutter ([place])   . Bilateral leg edema   . SOB (shortness of breath)   . Morbid obesity due to excess calories ([place])   . CHF (congestive heart failure) ([place]) 12/11/2015    Past Surgical History:  Procedure Laterality Date  . Cataract Surgery Right    03/2014  . CESAREAN SECTION    . LEG SURGERY      Prior to Admission medications   Medication Sig Start Date End Date Taking? Authorizing Provider  apixaban (ELIQUIS) 5 MG TABS tablet Take 1 tablet (5 mg total) by mouth 2 (two) times daily. 01/28/16   Minna Merritts, MD  atenolol (TENORMIN) 25 MG tablet Take 0.5 tablets (12.5 mg total) by mouth 2 (two) times daily. 12/12/15   Hillary Bow, MD  atorvastatin (LIPITOR) 80 MG tablet Take 80 mg by mouth daily at 6 PM.  12/30/15   Historical Provider, MD  cloNIDine (CATAPRES) 0.1 MG tablet Take 1 tablet (0.1 mg total) by mouth 2 (two) times daily. 01/19/16   Minna Merritts,  MD  furosemide (LASIX) 40 MG tablet Take 1 tablet (40 mg total) by mouth 2 (two) times daily. 03/22/16   Minna Merritts, MD  gabapentin (NEURONTIN) 100 MG capsule Take 1 capsule (100 mg total) by mouth daily. 07/13/16 07/13/17  Victorino Dike, FNP  glipiZIDE (GLUCOTROL XL) 10 MG 24 hr tablet Take 10 mg by mouth daily as needed. Patient takes with levemir when blood sugar is high    Historical Provider, MD  insulin aspart (NOVOLOG) 100 UNIT/ML injection Inject 3 Units into the skin 3 (three) times daily before meals.    Historical Provider, MD  insulin detemir (LEVEMIR) 100 UNIT/ML injection Inject 30 Units into the skin at bedtime.    Historical Provider, MD   lisinopril (PRINIVIL,ZESTRIL) 40 MG tablet Take 40 mg by mouth daily.    Historical Provider, MD  metFORMIN (GLUCOPHAGE) 500 MG tablet Take 1,000 mg by mouth 2 (two) times daily.     Historical Provider, MD  Multiple Vitamin (MULTIVITAMIN WITH MINERALS) TABS tablet Take 1 tablet by mouth daily.    Historical Provider, MD  omeprazole (PRILOSEC) 20 MG capsule Take 20 mg by mouth daily.    Historical Provider, MD  oxybutynin (DITROPAN) 5 MG tablet Take 5 mg by mouth 2 (two) times daily.    Historical Provider, MD  spironolactone (ALDACTONE) 25 MG tablet Take 25 mg by mouth daily.    Historical Provider, MD  VENTOLIN HFA 108 (90 Base) MCG/ACT inhaler Inhale 2 puffs into the lungs every 6 (six) hours as needed.  12/13/15   Historical Provider, MD    Allergies Patient has no known allergies.  Family History  Problem Relation Age of Onset  . Heart attack Father     died in his early 79's.  . Cancer Mother     deceased  . Cancer Brother     multiple cancers, deceased.    Social History Social History  Substance Use Topics  . Smoking status: Former Research scientist (life sciences)  . Smokeless tobacco: Former Systems developer  . Alcohol use No    Review of Systems Constitutional: No fever/chills Eyes: No visual changes. ENT: No sore throat. No stiff neck no neck pain Cardiovascular: Positive chest pain. Respiratory: Positive shortness of breath. Gastrointestinal:   no vomiting.  No diarrhea.  No constipation. Genitourinary: Negative for dysuria. Musculoskeletal: Positive chronic lower extremity swelling Skin: Positive for rash. Neurological: Negative for severe headaches, focal weakness or numbness. 10-point ROS otherwise negative.  ____________________________________________   PHYSICAL EXAM:  VITAL SIGNS: ED Triage Vitals  Enc Vitals Group     BP 08/05/16 1055 (!) 150/101     Pulse --      Resp 08/05/16 1055 16     Temp 08/05/16 1055 97.7 F (36.5 C)     Temp Source 08/05/16 1055 Oral     SpO2 08/05/16  1056 98 %     Weight 08/05/16 1047 187 lb (84.8 kg)     Height 08/05/16 1047 5' (1.524 m)     Head Circumference --      Peak Flow --      Pain Score 08/05/16 1104 8     Pain Loc --      Pain Edu? --      Excl. in [place]? --     Constitutional: Alert and oriented. Well appearing and in no acute distress. Eyes: Conjunctivae are normal. PERRL. EOMI. Head: Atraumatic. Nose: No congestion/rhinnorhea. Mouth/Throat: Mucous membranes are moist.  Oropharynx non-erythematous. Neck: No stridor.  Nontender with no meningismus Cardiovascular: Cardiac, regularly irregular. Grossly normal heart sounds.  Good peripheral circulation. Respiratory: Normal respiratory effort.  No retractions. Lungs diffuse bilateral wheeze noted Chest: Tender to palpation left chest wall, [place] area patient says "ouch that's the pain right there" and pulls back. There is no herpetic lesions, no crepitus no flail chest. Pain also reproducible and patient pulls up in the bed. Abdominal: Soft and nontender. No distention. No guarding no rebound Back:  There is no focal tenderness or step off.  there is no midline tenderness there are no lesions noted. there is no CVA tenderness Musculoskeletal: No lower extremity tenderness, no upper extremity tenderness. No joint effusions, no DVT signs strong distal pulses positive edema Neurologic:  Normal speech and language. No gross focal neurologic deficits are appreciated.  Skin: There is erythema bilaterally or extremity but worse on the left side. There is very significant toenail fungus noted. There is a serpiginous erythematous level above the right ankle. Not markedly tender. Positive warmth to touch. There is, to that ankle, very small area of excoriation which could be also the source Psychiatric: Mood and affect are normal. Speech and behavior are normal.  ____________________________________________   LABS (all labs ordered are listed, but only abnormal results are  displayed)  Labs Reviewed  BASIC METABOLIC PANEL - Abnormal; Notable for the following:       Result Value   Chloride 95 (*)    Glucose, Bld 178 (*)    BUN 43 (*)    Creatinine, Ser 1.24 (*)    GFR calc non Af Amer 46 (*)    GFR calc Af Amer 53 (*)    All other components within normal limits  CBC - Abnormal; Notable for the following:    RDW 16.3 (*)    All other components within normal limits  BRAIN NATRIURETIC PEPTIDE - Abnormal; Notable for the following:    B Natriuretic Peptide 355.0 (*)    All other components within normal limits  TROPONIN I  URINALYSIS, COMPLETE (UACMP) WITH MICROSCOPIC   ____________________________________________  EKG  I personally interpreted any EKGs ordered by me or triage Atrial fibrillation rate 129 bpm, no acute ischemic changes normal axis, ____________________________________________  RADIOLOGY  I reviewed any imaging ordered by me or triage that were performed during my shift and, if possible, patient and/or family made aware of any abnormal findings. ____________________________________________   PROCEDURES  Procedure(s) performed: None  Procedures  Critical Care performed: None  ____________________________________________   INITIAL IMPRESSION / ASSESSMENT AND PLAN / ED COURSE  Pertinent labs & imaging results that were available during my care of the patient were reviewed by me and considered in my medical decision making (see chart for details).   Patient here with multiple complaints. #1: Chest pain. This is most likely muscle social pain. Very reproducible. Happen after she went to the chiropractor. No obvious injury to the chest wall. However, given her medical conditions we did do a troponin which is reassuring. #2, shortness of breath, patient has a history of some degree of diastolic CHF, she has bilateral lower extremity edema, she also has a wheezes or feel is probably cardiogenic and she has been on chest x-ray.  I feel she will need diuresis.  #3 patient has atrial fibrillation, did not take her medication is wearing. Has therefore A. fib with RVR, heart rates are in the 130s to 140s initially, we have given her Cardizem we'll give her more. Given her wheeze I'm reluctant  to give atenolol. She may need a drip at this time, her heart rates are in the low 110s after a single bolus. Also, patient did receive albuterol here. Those cause her lungs to clear, however, it has increased her heart rate.  #4, patient has what appears to be a cellulitic change to her left lower extremity with a clear line of demarcation, she is taking some antibiotic at home but it seems to be getting worse. We will give the patient antibiotics here that hopefully will cover, clindamycin, and we I think for all these different symptoms likely will have to admit her to the hospital.     Clinical Course    ____________________________________________   FINAL CLINICAL IMPRESSION(S) / ED DIAGNOSES  Final diagnoses:  None      This chart was dictated using voice recognition software.  Despite best efforts to proofread,  errors can occur which can change meaning.      Schuyler Amor, MD 08/05/16 1257

## 2016-08-05 NOTE — Consult Note (Signed)
Cardiology Consultation Note  Patient ID: Megan Cisneros, MRN: [id number], DOB/AGE: 11-21-1955 60 y.o. Admit date: 08/05/2016   Date of Consult: 08/05/2016 Primary Physician: Donnie Coffin, MD Primary Cardiologist: Dr. Rockey Situ, MD Requesting Physician: Dr. Tressia Miners, MD  Chief Complaint: SOB Reason for Consult: Afib/flutter with RVR/acute on chronic diastolic CHF  HPI: 60 y.o. female with h/o atrial flutter on Eliquis, chronic diastolic CHF, PAD s/p RLE fem-fem artery bypass in [place], [place] in 2004, two TIA's/stroke in 2007 in [place], more recently CKD stage II over this past year, chronic RLE edema, chronic DOE, DM, HTN, and morbid obesity who presented to [facility] ED on 12/7 with multiple complaints including atypical chest pain, SOB, and LE swelling. She was noted to be in atrial flutter with RVR.   Noted to be in atrial flutter in 11/2015 in the [facility] ED with SOB. Rate was well controlled at that time. She declined DCCV. Was started on rate control and Eliquis. Echo from 11/2015 showed EF 60-65%, normal wall motion, not technically sufficient to allow for LV diastolic function, mild MR, left atirum mildly dilated, RV systolic function normal, rhythm was noted to be atrial flutter with PVCs. At her last follow up in 02/2016 she reported her LE edema had improved with holding of her CCB, though still had some swelling. Weight was down to 210 from 216 at her last office visit. Weight upon admission of 185 pounds. States she has had a poor appetite as she now gets very fatigued by just eating.Taking Lasix 80 mg daily.   She presented to [facility] ED on 12/7 with complaints of chest pain after getting an adjustment from her chiropracter for chronic back pain. Her chest pain has been worse with positional changes and reproducible to palpation. Over the past couple of weeks she has noticed worsening SOB LE rash (increased from her baseline LE swelling)/cellulitis. No orthopnea, early satiety, PND, or cough. In the ED  troponin negative. EKG showed Afib with RVR, 129 bpm, left axis deviation, left anterior fascicular block, nonspecific st/t changes. She was noted to be in Afib with RVR with heart rates in the 130-140 bpm range. She reported not taking her medications this morning stating she was worried about the above. CXR showed mild CHF. She was given Cardizem injection of 10 mg followed by 15 mg in the ED with heart rates improving to the low 100's bpm. She was not given beta blocker in the ED 2/2 wheezing. BNP noted to be 355. WBC 7.7, HGB 13.2, PLT 284, SCr 1.24 (baseline approximately 1 to 1 teens), K+ 3.7. She was started on IV Lasix 40 mg bid upon admission. She was continued on atenolol for her Afib/flutter. Upon arrival to 2A when movement from the ED bed to telemetry bed she went into atrial flutter with heart rates into the 150's and has maintained that rhythm since. She was just given IV Cardizem injection at time of cardiology consult. She is still somewhat hesitant to move forward with DCCV, though is more open to this at this time. Cannot feel any palpitations.      Past Medical History:  Diagnosis Date  . Atrial flutter ([place])    a. Dx 11/2015 -->CHA2DS2VASc = 7-->Eliquis '5mg'$  BID.  Marland Kitchen Breast cancer ([place])   . Chronic diastolic CHF (congestive heart failure) ([place])    a. 11/2015 Echo: EF 60-65%, no rwma, mild MR, mildly dil LA, nl RV fxn.  . Chronic Dyspnea on Exertion    a. 2004 Stress test  prior to LE bypass->reportedly nl.  . Diabetes mellitus without complication ([place])    a. Dx as borderline diabetic in the 80's-->progressed over the years.  . Essential hypertension   . Lower extremity edema   . Morbid obesity ([place])   . PVC's (premature ventricular contractions)   . PVD (peripheral vascular disease) ([place])    a. 2004 s/p RLE bypass - complicated by recurrent infection.  . Stroke [facility])    a. TIA x 2;  b. Stroke in 2007 in [place].      Most Recent Cardiac Studies: As above   Surgical  History:  Past Surgical History:  Procedure Laterality Date  . Cataract Surgery Right    03/2014  . CESAREAN SECTION    . LEG SURGERY    . MASTECTOMY Left   . right leg bypass Right      Home Meds: Prior to Admission medications   Medication Sig Start Date End Date Taking? Authorizing Provider  apixaban (ELIQUIS) 5 MG TABS tablet Take 1 tablet (5 mg total) by mouth 2 (two) times daily. 01/28/16  Yes Minna Merritts, MD  atenolol (TENORMIN) 25 MG tablet Take 0.5 tablets (12.5 mg total) by mouth 2 (two) times daily. 12/12/15  Yes Srikar Sudini, MD  atorvastatin (LIPITOR) 80 MG tablet Take 80 mg by mouth daily at 6 PM.  12/30/15  Yes Historical Provider, MD  cloNIDine (CATAPRES) 0.1 MG tablet Take 1 tablet (0.1 mg total) by mouth 2 (two) times daily. 01/19/16  Yes Minna Merritts, MD  furosemide (LASIX) 40 MG tablet Take 1 tablet (40 mg total) by mouth 2 (two) times daily. Patient taking differently: Take 30 mg by mouth daily.  03/22/16  Yes Minna Merritts, MD  gabapentin (NEURONTIN) 100 MG capsule Take 1 capsule (100 mg total) by mouth daily. Patient taking differently: Take 100 mg by mouth 2 (two) times daily.  07/13/16 07/13/17 Yes Cari B Triplett, FNP  insulin detemir (LEVEMIR) 100 UNIT/ML injection Inject 10 Units into the skin daily.    Yes Historical Provider, MD  lisinopril (PRINIVIL,ZESTRIL) 40 MG tablet Take 40 mg by mouth daily.   Yes Historical Provider, MD  metFORMIN (GLUCOPHAGE) 500 MG tablet Take 1,000 mg by mouth 2 (two) times daily.    Yes Historical Provider, MD  Multiple Vitamin (MULTIVITAMIN WITH MINERALS) TABS tablet Take 1 tablet by mouth daily.   Yes Historical Provider, MD  omeprazole (PRILOSEC) 20 MG capsule Take 20 mg by mouth daily.   Yes Historical Provider, MD  oxybutynin (DITROPAN) 5 MG tablet Take 5 mg by mouth 2 (two) times daily.   Yes Historical Provider, MD  spironolactone (ALDACTONE) 25 MG tablet Take 25 mg by mouth daily.   Yes Historical Provider, MD    VENTOLIN HFA 108 (90 Base) MCG/ACT inhaler Inhale 2 puffs into the lungs every 6 (six) hours as needed.  12/13/15   Historical Provider, MD    Inpatient Medications:  . apixaban  5 mg Oral BID  . atorvastatin  80 mg Oral q1800  . cloNIDine  0.1 mg Oral BID  . diltiazem      . docusate sodium  100 mg Oral BID  . furosemide  40 mg Intravenous Q12H  . gabapentin  100 mg Oral Daily  . insulin aspart  0-5 Units Subcutaneous QHS  . insulin aspart  0-9 Units Subcutaneous TID WC  . insulin detemir  10 Units Subcutaneous QHS  . metFORMIN  1,000 mg Oral BID WC  .  metoprolol tartrate  12.5 mg Oral Q6H  . [START ON 08/06/2016] multivitamin with minerals  1 tablet Oral Daily  . oxybutynin  5 mg Oral BID  . [START ON 08/06/2016] pantoprazole  40 mg Oral Daily  . potassium chloride  30 mEq Oral Once  . [START ON 08/06/2016] potassium chloride  20 mEq Oral BID  . sodium chloride flush  3 mL Intravenous Q12H  . triamcinolone ointment   Topical BID   . diltiazem (CARDIZEM) infusion      Allergies: No Known Allergies  Social History   Social History  . Marital status: Widowed    Spouse name: N/A  . Number of children: N/A  . Years of education: N/A   Occupational History  . Not on file.   Social History Main Topics  . Smoking status: Former Research scientist (life sciences)  . Smokeless tobacco: Former Systems developer  . Alcohol use No  . Drug use: No  . Sexual activity: Not on file   Other Topics Concern  . Not on file   Social History Narrative   Lives in [place] by herself.  Originally from [place], [place], later moved to [place] and then [place].  Has been in [place] for 8 yrs.  Family nearby.  Does not routinely exercise.   Has a cane and a walker     Family History  Problem Relation Age of Onset  . Heart attack Father     died in his early 72's.  . Diabetes Father   . Cancer Mother     deceased  . Colon cancer Mother   . Cancer Brother     multiple cancers, deceased.  . Leukemia Brother   . Liver cancer Brother   .  Pancreatic cancer Brother      Review of Systems: Review of Systems  Constitutional: Positive for malaise/fatigue. Negative for chills, diaphoresis, fever and weight loss.  HENT: Negative for congestion.   Eyes: Negative for discharge and redness.  Respiratory: Positive for shortness of breath and wheezing. Negative for cough, hemoptysis and sputum production.   Cardiovascular: Positive for leg swelling. Negative for chest pain, palpitations, orthopnea, claudication and PND.  Gastrointestinal: Negative for abdominal pain, blood in stool, heartburn, melena, nausea and vomiting.  Genitourinary: Negative for hematuria.  Musculoskeletal: Negative for falls and myalgias.  Skin: Positive for rash.  Neurological: Positive for weakness. Negative for dizziness, tingling, tremors, sensory change, speech change, focal weakness and loss of consciousness.  Endo/Heme/Allergies: Does not bruise/bleed easily.  Psychiatric/Behavioral: Negative for substance abuse. The patient is not nervous/anxious.   All other systems reviewed and are negative.   Labs:  Recent Labs  08/05/16 1114  TROPONINI <0.03   Lab Results  Component Value Date   WBC 7.7 08/05/2016   HGB 13.2 08/05/2016   HCT 39.8 08/05/2016   MCV 86.4 08/05/2016   PLT 284 08/05/2016     Recent Labs Lab 08/05/16 1114  NA 139  K 3.7  CL 95*  CO2 31  BUN 43*  CREATININE 1.24*  CALCIUM 8.9  GLUCOSE 178*   Lab Results  Component Value Date   CHOL 171 04/04/2014   HDL 31 (L) 04/04/2014   LDLCALC 70 04/04/2014   TRIG 351 (H) 04/04/2014   No results found for: DDIMER  Radiology/Studies:  Dg Chest 2 View  Result Date: 08/05/2016 CLINICAL DATA:  Chest pain EXAM: CHEST  2 VIEW COMPARISON:  12/11/2015 FINDINGS: Mild cardiac enlargement. Aortic atherosclerosis noted. Mild diffuse pulmonary edema is identified concerning for CHF.  Thickening of the scratch set fluid is identified extending along the fissures within the right lung.  IMPRESSION: 1. Mild congestive heart failure. Electronically Signed   By: Kerby Moors M.D.   On: 08/05/2016 11:46   Dg Foot Complete Right  Result Date: 07/13/2016 CLINICAL DATA:  Lateral foot pain.  No known injury. EXAM: RIGHT FOOT COMPLETE - 3+ VIEW COMPARISON:  None. FINDINGS: No acute bony abnormality. Specifically, no fracture, subluxation, or dislocation. Soft tissues are intact. IMPRESSION: No acute bony abnormality. Electronically Signed   By: Rolm Baptise M.D.   On: 07/13/2016 14:21    EKG: Interpreted by me showed: Afib with RVR, 129 bpm, left axis deviation, left anterior fascicular block, nonspecific st/t changes Telemetry: Interpreted by me showed: Afib/flutter with RVR with current heart rate of 154 bpm sustained   Weights: Filed Weights   08/05/16 1047 08/05/16 1452  Weight: 187 lb (84.8 kg) 185 lb (83.9 kg)     Physical Exam: Blood pressure (!) 149/108, pulse (!) 125, temperature 98.5 F (36.9 C), temperature source Oral, resp. rate 14, height 5' (1.524 m), weight 185 lb (83.9 kg), SpO2 98 %. Body mass index is 36.13 kg/m. General: Well developed, well nourished, in no acute distress. Head: Normocephalic, atraumatic, sclera non-icteric, no xanthomas, nares are without discharge.  Neck: Negative for carotid bruits. JVD not elevated. Lungs: Clear bilaterally to auscultation without wheezes, rales, or rhonchi. Breathing is unlabored. Heart: Tachycardic, regular with S1 S2. No murmurs, rubs, or gallops appreciated. Abdomen: Obese, soft, non-tender, non-distended with normoactive bowel sounds. No hepatomegaly. No rebound/guarding. No obvious abdominal masses. Msk:  Strength and tone appear normal for age. Extremities: No clubbing or cyanosis. Trace pre-tibial edema. Erythema noted along the right lower leg Distal pedal pulses are 2+ and equal bilaterally. Neuro: Alert and oriented X 3. No facial asymmetry. No focal deficit. Moves all extremities spontaneously. Psych:   Responds to questions appropriately with a normal affect.    Assessment and Plan:  Principal Problem:   Atrial flutter with rapid ventricular response (HCC) Active Problems:   Acute on chronic diastolic CHF (congestive heart failure) (HCC)   Hypokalemia   Bilateral leg edema   Morbid obesity due to excess calories (HCC)   Essential hypertension   Hypomagnesemia    1. Atrial flutter with RVR: -Heart rate currently in the 150 bpm range, just received IV Cardizem injection -Heart rate improved briefly with prior Cardizem injections in the ED to the low 100's bpm -Will start Cardizem gtt in an effort to rate control < 100 bpm -Given she has previously briefly responded well to Cardizem injection I will not give a one-time IV digoxin load at this time. 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  Given she cannot feel her palpitations this may now be reduced -Consider limited echo to evaluate EF and wall motion once she is rate controlled -Replete potasium to 4.0 -History of hypomagnesemia, check and replete to 2.0 -Check TSH  2. Acute on chronic diastolic CHF: -Possibly systolic CHF as well at this time 2/2 tachycardia    -Continue IV Lasix 40 mg bid with KCl repletion -Atenolol changed to Lopressor as above -Spironolactone on hold to allow for more BP room for rate control -Check limited echo once rate is controlled  3. Hypokalemia: -As above  4. Hypomagnesemia: -As above  5. AKI: -Monitor with diuresis   6. Remaining per IM   Signed, Christell Faith, PA-C [facility] Pager: [phone] 08/05/2016, 4:14 PM

## 2016-08-05 NOTE — H&P (Signed)
[facility] [place] NAME: Megan Cisneros    MR#:  [id number]  DATE OF BIRTH:  1956/07/20  DATE OF ADMISSION:  08/05/2016  PRIMARY CARE PHYSICIAN: Donnie Coffin, MD   REQUESTING/REFERRING PHYSICIAN: Dr. Charlotte Crumb  CHIEF COMPLAINT:   Chief Complaint  Patient presents with  . Chest Pain    HISTORY OF PRESENT ILLNESS:  Megan Cisneros  is a 60 y.o. female with a known history of diastolic CHF, atrial flutter on eliquis, chronic right leg extremity edema, chronic dyspnea, h/o TIA and CVA, DM Presents to hospital secondary to worsening shortness of breath and chest pain. Patient has chronic dyspnea for several years, however feels like her dyspnea has worsened in the last couple of weeks. She has arthritis in her back and has back pain problems for which she is following with a chiropractor. Last few day she is pain having musculoskeletal left-sided chest pain radiating to the back. She does have chronic lower extremity edema worse on the right side secondary to her femorofemoral bypass surgery, but that has been increasing as well. She follows up with her cardiologist and saw him in about 4 months ago. She takes Lasix twice a day. Chest x-ray here reveals pulmonary edema. Denies any cough, congestion. Does have some postnasal drip. No fevers, Chills. no nausea or vomiting. So she is being admitted for CHF exacerbation.  PAST MEDICAL HISTORY:   Past Medical History:  Diagnosis Date  . Atrial flutter ([place])    a. Dx 11/2015 -->CHA2DS2VASc = 7-->Eliquis '5mg'$  BID.  Marland Kitchen Breast cancer ([place])   . Chronic diastolic CHF (congestive heart failure) ([place])    a. 11/2015 Echo: EF 60-65%, no rwma, mild MR, mildly dil LA, nl RV fxn.  . Chronic Dyspnea on Exertion    a. 2004 Stress test prior to LE bypass->reportedly nl.  . Diabetes mellitus without complication ([place])    a. Dx as borderline diabetic in the 80's-->progressed over the years.  . Essential  hypertension   . Lower extremity edema   . Morbid obesity ([place])   . PVC's (premature ventricular contractions)   . PVD (peripheral vascular disease) ([place])    a. 2004 s/p RLE bypass - complicated by recurrent infection.  . Stroke [facility])    a. TIA x 2;  b. Stroke in 2007 in [place].    PAST SURGICAL HISTORY:   Past Surgical History:  Procedure Laterality Date  . Cataract Surgery Right    03/2014  . CESAREAN SECTION    . LEG SURGERY    . MASTECTOMY Left   . right leg bypass Right     SOCIAL HISTORY:   Social History  Substance Use Topics  . Smoking status: Former Research scientist (life sciences)  . Smokeless tobacco: Former Systems developer  . Alcohol use No    FAMILY HISTORY:   Family History  Problem Relation Age of Onset  . Heart attack Father     died in his early 64's.  . Diabetes Father   . Cancer Mother     deceased  . Colon cancer Mother   . Cancer Brother     multiple cancers, deceased.  . Leukemia Brother   . Liver cancer Brother   . Pancreatic cancer Brother     DRUG ALLERGIES:  No Known Allergies  REVIEW OF SYSTEMS:   Review of Systems  Constitutional: Positive for malaise/fatigue. Negative for chills, fever and weight loss.  HENT: Negative for ear discharge, ear pain, hearing  loss and nosebleeds.   Eyes: Negative for blurred vision, double vision and photophobia.  Respiratory: Positive for cough and shortness of breath. Negative for hemoptysis and wheezing.   Cardiovascular: Positive for chest pain, orthopnea and leg swelling. Negative for palpitations.  Gastrointestinal: Negative for abdominal pain, constipation, diarrhea, heartburn, melena, nausea and vomiting.  Genitourinary: Negative for dysuria and urgency.  Musculoskeletal: Positive for back pain. Negative for myalgias and neck pain.  Skin: Negative for rash.  Neurological: Negative for dizziness, tremors, sensory change, speech change, focal weakness and headaches.  Endo/Heme/Allergies: Does not bruise/bleed easily.    Psychiatric/Behavioral: Negative for depression.    MEDICATIONS AT HOME:   Prior to Admission medications   Medication Sig Start Date End Date Taking? Authorizing Provider  apixaban (ELIQUIS) 5 MG TABS tablet Take 1 tablet (5 mg total) by mouth 2 (two) times daily. 01/28/16   Minna Merritts, MD  atenolol (TENORMIN) 25 MG tablet Take 0.5 tablets (12.5 mg total) by mouth 2 (two) times daily. 12/12/15   Hillary Bow, MD  atorvastatin (LIPITOR) 80 MG tablet Take 80 mg by mouth daily at 6 PM.  12/30/15   Historical Provider, MD  cloNIDine (CATAPRES) 0.1 MG tablet Take 1 tablet (0.1 mg total) by mouth 2 (two) times daily. 01/19/16   Minna Merritts, MD  furosemide (LASIX) 40 MG tablet Take 1 tablet (40 mg total) by mouth 2 (two) times daily. 03/22/16   Minna Merritts, MD  gabapentin (NEURONTIN) 100 MG capsule Take 1 capsule (100 mg total) by mouth daily. 07/13/16 07/13/17  Victorino Dike, FNP  glipiZIDE (GLUCOTROL XL) 10 MG 24 hr tablet Take 10 mg by mouth daily as needed. Patient takes with levemir when blood sugar is high    Historical Provider, MD  insulin aspart (NOVOLOG) 100 UNIT/ML injection Inject 3 Units into the skin 3 (three) times daily before meals.    Historical Provider, MD  insulin detemir (LEVEMIR) 100 UNIT/ML injection Inject 30 Units into the skin at bedtime.    Historical Provider, MD  lisinopril (PRINIVIL,ZESTRIL) 40 MG tablet Take 40 mg by mouth daily.    Historical Provider, MD  metFORMIN (GLUCOPHAGE) 500 MG tablet Take 1,000 mg by mouth 2 (two) times daily.     Historical Provider, MD  Multiple Vitamin (MULTIVITAMIN WITH MINERALS) TABS tablet Take 1 tablet by mouth daily.    Historical Provider, MD  omeprazole (PRILOSEC) 20 MG capsule Take 20 mg by mouth daily.    Historical Provider, MD  oxybutynin (DITROPAN) 5 MG tablet Take 5 mg by mouth 2 (two) times daily.    Historical Provider, MD  spironolactone (ALDACTONE) 25 MG tablet Take 25 mg by mouth daily.    Historical  Provider, MD  VENTOLIN HFA 108 (90 Base) MCG/ACT inhaler Inhale 2 puffs into the lungs every 6 (six) hours as needed.  12/13/15   Historical Provider, MD      VITAL SIGNS:  Blood pressure 115/77, pulse (!) 154, temperature 97.7 F (36.5 C), temperature source Oral, resp. rate (!) 21, height 5' (1.524 m), weight 84.8 kg (187 lb), SpO2 99 %.  PHYSICAL EXAMINATION:   Physical Exam  GENERAL:  60 y.o.-year-old obese patient lying in the bed with no acute distress.  EYES: Pupils equal, round, reactive to light and accommodation. No scleral icterus. Extraocular muscles intact.  HEENT: Head atraumatic, normocephalic. Oropharynx and nasopharynx clear.  NECK:  Supple, no jugular venous distention. No thyroid enlargement, no tenderness.  LUNGS: Normal breath sounds bilaterally,  occasional scattered wheeze inspiratory, no rales,rhonchi or crepitation. No use of accessory muscles of respiration.  CARDIOVASCULAR: S1, S2 normal. No  rubs, or gallops. 2/6 systolic murmur present. ABDOMEN: Soft, nontender, nondistended. Bowel sounds present. No organomegaly or mass.  EXTREMITIES: No cyanosis, or clubbing. 2+ bilateral leg edema Erythematous, pruritic superficial rash on right leg and knee noted. NEUROLOGIC: Cranial nerves II through XII are intact. Muscle strength 5/5 in all extremities. Sensation intact. Gait not checked.  PSYCHIATRIC: The patient is alert and oriented x 3.  SKIN: No obvious rash other than on the right leg,no lesion, or ulcer.   LABORATORY PANEL:   CBC  Recent Labs Lab 08/05/16 1114  WBC 7.7  HGB 13.2  HCT 39.8  PLT 284   ------------------------------------------------------------------------------------------------------------------  Chemistries   Recent Labs Lab 08/05/16 1114  NA 139  K 3.7  CL 95*  CO2 31  GLUCOSE 178*  BUN 43*  CREATININE 1.24*  CALCIUM 8.9    ------------------------------------------------------------------------------------------------------------------  Cardiac Enzymes  Recent Labs Lab 08/05/16 1114  TROPONINI <0.03   ------------------------------------------------------------------------------------------------------------------  RADIOLOGY:  Dg Chest 2 View  Result Date: 08/05/2016 CLINICAL DATA:  Chest pain EXAM: CHEST  2 VIEW COMPARISON:  12/11/2015 FINDINGS: Mild cardiac enlargement. Aortic atherosclerosis noted. Mild diffuse pulmonary edema is identified concerning for CHF. Thickening of the scratch set fluid is identified extending along the fissures within the right lung. IMPRESSION: 1. Mild congestive heart failure. Electronically Signed   By: Kerby Moors M.D.   On: 08/05/2016 11:46    EKG:   Orders placed or performed during the hospital encounter of 08/05/16  . EKG 12-Lead  . EKG 12-Lead  . ED EKG within 10 minutes  . ED EKG within 10 minutes    IMPRESSION AND PLAN:   Kailani Brass  is a 60 y.o. female with a known history of diastolic CHF, atrial flutter on eliquis, chronic right leg extremity edema, chronic dyspnea, h/o TIA and CVA, DM Presents to hospital secondary to worsening shortness of breath and chest pain.  #1 acute on chronic diastolic CHF exacerbation-admit to telemetry, -On IV Lasix twice a day. Cardiology consulted. -Daily weights, salt restricted diet, strict input and output monitoring.  #2 atrial flutter with rapid ventricular response-continue cardiac medications. -On low-dose atenolol. On eliquis for anticoagulation. -Monitor closely for any dosage adjustments.  #3 acute renal failure-could be cardiorenal syndrome. Monitor closely while on Lasix. Also has underlying CK D. Creatinine is close to baseline.  #4 chronic lower extremity edema-worse than baseline. Likely from CHF. -On Lasix. Has lower extremity rash on the right leg which could be psoriasis or eczema. -Started on  steroid ointment. Does not look like cellulitis. Patient complains of pruritus and no tenderness.  #5 history of CVA-no neurological deficits. On eliquis for anticoagulation due to history of atrial flutter.  #6 diabetes mellitus-on metformin, Levemir and sliding scale insulin.  #7 DVT prophylaxis heparin already on eliquis.  Physical therapy consulted   All the records are reviewed and case discussed with ED provider. Management plans discussed with the patient, family and they are in agreement.  CODE STATUS: Full Code  TOTAL TIME TAKING CARE OF THIS PATIENT: 50 minutes.    Gladstone Lighter M.D on 08/05/2016 at 1:36 PM  Between 7am to 6pm - Pager - [phone]  After 6pm go to [email or web address] - password [email or web address]  Office  [phone]  CC: Primary care physician; Donnie Coffin, MD

## 2016-08-06 DIAGNOSIS — N390 Urinary tract infection, site not specified: Secondary | ICD-10-CM

## 2016-08-06 DIAGNOSIS — M549 Dorsalgia, unspecified: Secondary | ICD-10-CM

## 2016-08-06 DIAGNOSIS — N3000 Acute cystitis without hematuria: Secondary | ICD-10-CM

## 2016-08-06 DIAGNOSIS — M6281 Muscle weakness (generalized): Secondary | ICD-10-CM

## 2016-08-06 DIAGNOSIS — B354 Tinea corporis: Secondary | ICD-10-CM

## 2016-08-06 DIAGNOSIS — G8929 Other chronic pain: Secondary | ICD-10-CM

## 2016-08-06 LAB — GLUCOSE, CAPILLARY
GLUCOSE-CAPILLARY: 172 mg/dL — AB (ref 65–99)
Glucose-Capillary: 139 mg/dL — ABNORMAL HIGH (ref 65–99)
Glucose-Capillary: 181 mg/dL — ABNORMAL HIGH (ref 65–99)
Glucose-Capillary: 99 mg/dL (ref 65–99)

## 2016-08-06 LAB — BASIC METABOLIC PANEL
Anion gap: 10 (ref 5–15)
BUN: 44 mg/dL — ABNORMAL HIGH (ref 6–20)
CALCIUM: 9 mg/dL (ref 8.9–10.3)
CO2: 31 mmol/L (ref 22–32)
CREATININE: 1.32 mg/dL — AB (ref 0.44–1.00)
Chloride: 95 mmol/L — ABNORMAL LOW (ref 101–111)
GFR calc Af Amer: 49 mL/min — ABNORMAL LOW (ref >60)
GFR calc non Af Amer: 42 mL/min — ABNORMAL LOW (ref >60)
GLUCOSE: 161 mg/dL — AB (ref 65–99)
Potassium: 4.6 mmol/L (ref 3.5–5.1)
Sodium: 136 mmol/L (ref 135–145)

## 2016-08-06 LAB — CBC
HEMATOCRIT: 37.1 % (ref 35.0–47.0)
Hemoglobin: 12.4 g/dL (ref 12.0–16.0)
MCH: 28.9 pg (ref 26.0–34.0)
MCHC: 33.5 g/dL (ref 32.0–36.0)
MCV: 86.2 fL (ref 80.0–100.0)
Platelets: 295 10*3/uL (ref 150–440)
RBC: 4.3 MIL/uL (ref 3.80–5.20)
RDW: 16.3 % — AB (ref 11.5–14.5)
WBC: 7.7 10*3/uL (ref 3.6–11.0)

## 2016-08-06 LAB — TROPONIN I

## 2016-08-06 LAB — HEMOGLOBIN A1C
Hgb A1c MFr Bld: 7.1 % — ABNORMAL HIGH (ref 4.8–5.6)
Mean Plasma Glucose: 157 mg/dL

## 2016-08-06 LAB — MAGNESIUM: Magnesium: 2.4 mg/dL (ref 1.7–2.4)

## 2016-08-06 MED ORDER — DEXTROSE 5 % IV SOLN: 1.0000 g | INTRAVENOUS | Status: DC

## 2016-08-06 MED ORDER — FUROSEMIDE 40 MG PO TABS
40.0000 mg | ORAL_TABLET | Freq: Two times a day (BID) | ORAL | Status: DC
  Administered 2016-08-06 – 2016-08-08 (×5): 40 mg via ORAL
  Filled 2016-08-06 (×6): qty 1

## 2016-08-06 MED ORDER — DILTIAZEM HCL 25 MG/5ML IV SOLN
5.0000 mg | Freq: Once | INTRAVENOUS | Status: DC
  Filled 2016-08-06: qty 5

## 2016-08-06 MED ORDER — CEFTRIAXONE SODIUM-DEXTROSE 1-3.74 GM-% IV SOLR
1.0000 g | Freq: Every day | INTRAVENOUS | Status: DC
  Administered 2016-08-06 – 2016-08-09 (×4): 1 g via INTRAVENOUS
  Filled 2016-08-06 (×5): qty 50

## 2016-08-06 MED ORDER — MICONAZOLE NITRATE 2 % EX CREA
TOPICAL_CREAM | Freq: Two times a day (BID) | CUTANEOUS | Status: DC
  Filled 2016-08-06: qty 14

## 2016-08-06 MED ORDER — METOPROLOL TARTRATE 50 MG PO TABS
50.0000 mg | ORAL_TABLET | Freq: Four times a day (QID) | ORAL | Status: DC
  Administered 2016-08-06 – 2016-08-09 (×11): 50 mg via ORAL
  Filled 2016-08-06 (×12): qty 1

## 2016-08-06 MED ORDER — FLUCONAZOLE 150 MG PO TABS
150.0000 mg | ORAL_TABLET | ORAL | Status: DC
  Filled 2016-08-06: qty 1

## 2016-08-06 MED ORDER — CLOTRIMAZOLE 1 % EX CREA
TOPICAL_CREAM | Freq: Two times a day (BID) | CUTANEOUS | Status: DC
Start: 2016-08-06 — End: 2016-08-06
  Administered 2016-08-06: 13:00:00 via TOPICAL
  Filled 2016-08-06: qty 28.35

## 2016-08-06 MED ORDER — ITRACONAZOLE 100 MG PO CAPS
200.0000 mg | ORAL_CAPSULE | Freq: Two times a day (BID) | ORAL | Status: DC
  Filled 2016-08-06: qty 2

## 2016-08-06 NOTE — Progress Notes (Signed)
[facility] [place] NAME: Megan Cisneros    MR#:  [id number]  DATE OF BIRTH:  01/14/1956  SUBJECTIVE:  CHIEF COMPLAINT:  Patient denies any chest pain during my examination. Resting comfortably. Seen by Dr. Candis Musa. Denies any palpitations. Worried about redness of her legs  REVIEW OF SYSTEMS:  CONSTITUTIONAL: No fever, fatigue . Reporting weakness.  EYES: No blurred or double vision.  EARS, NOSE, AND THROAT: No tinnitus or ear pain.  RESPIRATORY: No cough, reports exertional shortness of breath, denies wheezing or hemoptysis.  CARDIOVASCULAR: No chest pain, orthopnea, edema.  GASTROINTESTINAL: No nausea, vomiting, diarrhea or abdominal pain.  GENITOURINARY: No dysuria, hematuria.  ENDOCRINE: No polyuria, nocturia,  HEMATOLOGY: No anemia, easy bruising or bleeding SKIN: Leg redness No rash or lesion. MUSCULOSKELETAL: Has chronic back pain No joint pain or arthritis.   NEUROLOGIC: No tingling, numbness, weakness.  PSYCHIATRY: No anxiety or depression.   DRUG ALLERGIES:  No Known Allergies  VITALS:  Blood pressure (!) 142/78, pulse 72, temperature 97.7 F (36.5 C), temperature source Oral, resp. rate 20, height 5' (1.524 m), weight 84.2 kg (185 lb 9.6 oz), SpO2 95 %.  PHYSICAL EXAMINATION:  GENERAL:  60 y.o.-year-old patient lying in the bed with no acute distress.  EYES: Pupils equal, round, reactive to light and accommodation. No scleral icterus. Extraocular muscles intact.  HEENT: Head atraumatic, normocephalic. Oropharynx and nasopharynx clear.  NECK:  Supple, no jugular venous distention. No thyroid enlargement, no tenderness.  LUNGS: Normal breath sounds bilaterally, no wheezing, rales,rhonchi or crepitation. No use of accessory muscles of respiration.  CARDIOVASCULAR: S1, S2 normal. No murmurs, rubs, or gallops.  ABDOMEN: Soft, nontender, nondistended. Bowel sounds present. No organomegaly or mass.  EXTREMITIES: Bilateral  lower extremity erythema more on the right lower extending to the left lower extremity , with a ring-shaped erythematous rash on the left foot with central clearing and raised margins No pedal edema, cyanosis, or clubbing.  NEUROLOGIC: Cranial nerves II through XII are intact. Muscle strength 5/5 in all extremities. Sensation intact. Gait not checked.  PSYCHIATRIC: The patient is alert and oriented x 3.  SKIN: No obvious rash, lesion, or ulcer.    LABORATORY PANEL:   CBC  Recent Labs Lab 08/06/16 0150  WBC 7.7  HGB 12.4  HCT 37.1  PLT 295   ------------------------------------------------------------------------------------------------------------------  Chemistries   Recent Labs Lab 08/06/16 0150  NA 136  K 4.6  CL 95*  CO2 31  GLUCOSE 161*  BUN 44*  CREATININE 1.32*  CALCIUM 9.0  MG 2.4   ------------------------------------------------------------------------------------------------------------------  Cardiac Enzymes  Recent Labs Lab 08/06/16 0150  TROPONINI <0.03   ------------------------------------------------------------------------------------------------------------------  RADIOLOGY:  Dg Chest 2 View  Result Date: 08/05/2016 CLINICAL DATA:  Chest pain EXAM: CHEST  2 VIEW COMPARISON:  12/11/2015 FINDINGS: Mild cardiac enlargement. Aortic atherosclerosis noted. Mild diffuse pulmonary edema is identified concerning for CHF. Thickening of the scratch set fluid is identified extending along the fissures within the right lung. IMPRESSION: 1. Mild congestive heart failure. Electronically Signed   By: Kerby Moors M.D.   On: 08/05/2016 11:46    EKG:   Orders placed or performed during the hospital encounter of 08/05/16  . EKG 12-Lead  . EKG 12-Lead    ASSESSMENT AND PLAN:   Megan Cisneros  is a 60 y.o. female with a known history of diastolic CHF, atrial flutter on eliquis, chronic right leg extremity edema, chronic dyspnea, h/o TIA and CVA, DM Presents  to hospital secondary to worsening shortness of breath and chest pain.  #1 acute on chronic diastolic CHF exacerbation- -clinically better today  telemetry, -On IV Lasix twice a day. Appreciate: [facility] recommendations- Daily weights, salt restricted diet, strict input and output monitoring.  #2 atrial flutter with rapid ventricular response- Rate controlled today  continue cardiac medications. Digoxin 0.25 mg daily by mouth -Patient is started on metoprolol 50 mg by mouth every 6 hours and discontinued atenolol On eliquis for anticoagulation. -Monitor closely for any dosage adjustments.  #3 acute renal failure-could be cardiorenal syndrome. Creatinine 1.24-1.32. Check BMP in a.m. Monitor closely while on Lasix. Also has underlying CK D.   Avoid nephrotoxins. Hold metformin  #4 acute cellulitis of bilateral lower extremities on  chronic lower extremity edema-worse than baseline.   worsened with CHF Started her on IV Rocephin and itraconazole for tinea pedis -On Lasix. Has lower extremity rash on the right leg which could be psoriasis or eczema.  discontinue steroid ointment as it will make it worse  #5 history of CVA-no neurological deficits. On eliquis for anticoagulation due to history of atrial flutter.  #6 diabetes mellitus-hold metformin  Levemir and sliding scale insulin.  #7 DVT prophylaxis heparin already on eliquis.   #Abnormal urinalysis urine culture is done patient is started on IV Rocephin     All the records are reviewed and case discussed with Care Management/Social Workerr. Management plans discussed with the patient, family and they are in agreement.  CODE STATUS: fc  TOTAL TIME TAKING CARE OF THIS PATIENT: 39  minutes.   POSSIBLE D/C IN 2  DAYS, DEPENDING ON CLINICAL CONDITION.  Note: This dictation was prepared with Dragon dictation along with smaller phrase technology. Any transcriptional errors that result from this  process are unintentional.   Nicholes Mango M.D on 08/06/2016 at 5:24 PM  Between 7am to 6pm - Pager - [phone] After 6pm go to [email or web address] - password [email or web address] [facility]  Office  [phone]  CC: Primary care physician; Donnie Coffin, MD

## 2016-08-06 NOTE — Care Management (Signed)
Patient presents from home and independent all adls.  Denies issues accessing medical care.  She has access to a rolling walker at home.  Is not requiring 02.  Physical therapy is recommending home health physical therapy and will benefit from nursing to follow cardiac status. Has had [facility] the past and would like to be serviced by that agency again.  Referral called.

## 2016-08-06 NOTE — Progress Notes (Signed)
Patient Name: Megan Cisneros Date of Encounter: 08/06/2016  Primary Cardiologist: Johnny Bridge, MD   Hospital Problem List     Principal Problem:   Atrial fibrillation with RVR [facility]) Active Problems:   Acute on chronic diastolic CHF (congestive heart failure) ([facility])   Cellulitis of left lower extremity   Bilateral leg edema   Morbid obesity due to excess calories ([facility])   Essential hypertension   Hypokalemia   Hypomagnesemia   Chest pain   UTI (urinary tract infection)   Tinea corporis    Subjective   Feeling much better.  HR's have been 80's to 90's on 10 of IV dilt.  She thinks that back pain x 1 month has been driving HR's up. Lower ext rash persists.  Inpatient Medications    . apixaban  5 mg Oral BID  . atorvastatin  80 mg Oral q1800  . digoxin  0.25 mg Oral Daily  . docusate sodium  100 mg Oral BID  . furosemide  40 mg Intravenous Q12H  . gabapentin  100 mg Oral Daily  . insulin aspart  0-5 Units Subcutaneous QHS  . insulin aspart  0-9 Units Subcutaneous TID WC  . insulin detemir  10 Units Subcutaneous QHS  . metFORMIN  1,000 mg Oral BID WC  . metoprolol tartrate  25 mg Oral Q6H  . multivitamin with minerals  1 tablet Oral Daily  . oxybutynin  5 mg Oral BID  . pantoprazole  40 mg Oral Daily  . potassium chloride  20 mEq Oral BID  . sodium chloride flush  3 mL Intravenous Q12H  . triamcinolone ointment   Topical BID    Vital Signs    Vitals:   08/05/16 2011 08/05/16 2046 08/05/16 2328 08/06/16 0435  BP: (!) 124/58  121/60 (!) 148/57  Pulse: (!) 113 (!) 101 89 70  Resp: '12  20 20  '$ Temp: 98.1 F (36.7 C)   97.6 F (36.4 C)  TempSrc: Oral   Oral  SpO2: 99%  95% 93%  Weight:    185 lb 9.6 oz (84.2 kg)  Height:        Intake/Output Summary (Last 24 hours) at 08/06/16 0954 Last data filed at 08/06/16 0940  Gross per 24 hour  Intake           514.91 ml  Output             1220 ml  Net          -705.09 ml   Filed Weights   08/05/16 1047 08/05/16 1452  08/06/16 0435  Weight: 187 lb (84.8 kg) 185 lb (83.9 kg) 185 lb 9.6 oz (84.2 kg)    Physical Exam   GEN: Well nourished, well developed, in no acute distress.  HEENT: Grossly normal.  Neck: Supple, no JVD, carotid bruits, or masses. Cardiac: IR, IR, no murmurs, rubs, or gallops. No clubbing, cyanosis, edema.  Radials/DP/PT 2+ and equal bilaterally.  Respiratory:  Respirations regular and unlabored, clear to auscultation bilaterally. GI: Soft, nontender, nondistended, BS + x 4. MS: no deformity or atrophy. Skin: warm and dry, diffuse erythematous rash to bilat lower ext.  Clearly defined margins in some areas. Neuro:  Strength and sensation are intact. Psych: AAOx3.  Normal affect.  Labs    CBC  Recent Labs  08/05/16 1114 08/06/16 0150  WBC 7.7 7.7  HGB 13.2 12.4  HCT 39.8 37.1  MCV 86.4 86.2  PLT 284 270   Basic Metabolic Panel  Recent Labs  08/05/16 1114 08/05/16 1619 08/06/16 0150  NA 139  --  136  K 3.7  --  4.6  CL 95*  --  95*  CO2 31  --  31  GLUCOSE 178*  --  161*  BUN 43*  --  44*  CREATININE 1.24*  --  1.32*  CALCIUM 8.9  --  9.0  MG  --  1.0*  --    Cardiac Enzymes  Recent Labs  08/05/16 1114 08/05/16 1458 08/06/16 0150  TROPONINI <0.03 <0.03 <0.03   Hemoglobin A1C  Recent Labs  08/05/16 1458  HGBA1C 7.1*   Thyroid Function Tests  Recent Labs  08/05/16 1619  TSH 3.136    Telemetry    Afib, mostly 80's to 90's currently.  Radiology    Dg Chest 2 View  Result Date: 08/05/2016 CLINICAL DATA:  Chest pain EXAM: CHEST  2 VIEW COMPARISON:  12/11/2015 FINDINGS: Mild cardiac enlargement. Aortic atherosclerosis noted. Mild diffuse pulmonary edema is identified concerning for CHF. Thickening of the scratch set fluid is identified extending along the fissures within the right lung. IMPRESSION: 1. Mild congestive heart failure. Electronically Signed   By: Kerby Moors M.D.   On: 08/05/2016 11:46    Patient Profile     60 y.o. 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 She was noted to be in atrial flutter with RVR.   Assessment & Plan    1.  Atrial fib/flutter with rapid ventricular response:  Pt with a h/o permanent afib/flutter, previously rate controlled on atenolol @ home.  [facility] w/ eliquis.  Presented 12/7 with multiple complaints (back pain, rash to bilat LE, UTI) and elevated HRs.  Rates much improved on lopressor, digoxin, and IV dilt.  Will wean IV dilt and titrate  blocker further.  She did not tolerate oral ccb in past 2/2 worsening lower ext edema.  She continues to wish to avoid DCCV.  If rates not adequately controlled on  blocker/digoxin, could consider antiarrhythmic Rx.  2.  Lower ext edema/? Cellulitis/Tinea Corporis:  Pt with progressive rash and redness to bilat lower ext.  Clearly defined margins in some areas.  Suspect this is primarily fungal.  Discussed with IM.  I will add miconazole 2% cream bid.  If no response, could consider oral diflucan or terbinafine.  3.  Acute on chronic diastolic CHF:  In setting of #1.  Volume stable today.  Wt stable.  HR's/BP currently stable.  Creat slightly elevated.  Change lasix to po.  4. Essential HTN:  Stable on  blocker, dilt.  Home dose of clonidine and spiro held in order to allow for titration of rate controlling meds.    5.  Hypomagnesemia:  Mg was 1.0 yesterday afternoon - treated w/ 4 g.  F/u this AM.  Will likely need mag ox.  6.  DM II:  Stable.  A1c down to 7.1 from 9.2 in 11/2015.  On metformin.  7.  Failure to thrive:  In setting of back pain.  She reports poor appetite @ home.  Wt down ~34 lbs since April 2017.  ? Rehab vs snf.     8.  UTI:  abx per IM.  Signed, Murray Hodgkins NP 08/06/2016, 9:54 AM

## 2016-08-06 NOTE — Progress Notes (Signed)
Initial Heart Failure Clinic appointment scheduled for August 25, 2016 at 9:00am. Thank you

## 2016-08-06 NOTE — Discharge Instructions (Signed)
Heart Failure Clinic appointment on August 25, 2016 at 9:00am with Darylene Price, [place]. Please call [phone] to reschedule.

## 2016-08-06 NOTE — Plan of Care (Addendum)
Problem: Cardiac: Goal: Ability to achieve and maintain adequate cardiopulmonary perfusion will improve Outcome: Progressing Intermittent dyspnea noted with ambulation to bedside commode.  Pt converted to NSR this shift.  0440 Notified of rhythm conversion back to Atrial fib. Rate remains controlled on Cardized drip.

## 2016-08-06 NOTE — Progress Notes (Signed)
Dr. Margaretmary Eddy made aware of + UTI

## 2016-08-06 NOTE — Evaluation (Signed)
Physical Therapy Evaluation Patient Details Name: Megan Cisneros MRN: [id number] DOB: 1956-01-19 Today's Date: 08/06/2016   History of Present Illness  Megan Cisneros  is a 60 y.o. female with a known history of diastolic CHF, atrial flutter on eliquis, chronic right leg extremity edema, chronic dyspnea, h/o TIA and CVA, DM Presents to hospital secondary to worsening shortness of breath and chest pain. Patient has chronic dyspnea for several years, however feels like her dyspnea has worsened in the last couple of weeks. She has arthritis in her back and has back pain problems for which she is following with a chiropractor. Last few day she is pain having musculoskeletal left-sided chest pain radiating to the back. She does have chronic lower extremity edema worse on the right side secondary to her femorofemoral bypass surgery, but that has been increasing as well. She follows up with her cardiologist and saw him in about 4 months ago. She takes Lasix twice a day. Chest x-ray here reveals pulmonary edema. Denies any cough, congestion. Does have some postnasal drip. No fevers, Chills. no nausea or vomiting. So she is being admitted for CHF exacerbation. Pt currently on Cardizem drip which was started yesterday afternoon due to uncontrolled rate. Per NSG notes she converted to NSR overnight however telemetry still reading a-fib this AM but rate appears controlled  Clinical Impression  Pt admitted with above diagnosis. Pt currently with functional limitations due to the deficits listed below (see PT Problem List).  Pt demonstrates ability to ambulate approximately 150' with rolling walker. HR increases to 115-120 bpm and pt reports mild DOE. SaO2 stable on room air. Pt ambulates slowly but is steady with rolling walker and speed is sufficient for full household mobility. She lives alone but has good family support. Pt reports that her ambulation is currently close to baseline and she has not concerns about returning  home at discharge. She agrees to [facility] PT at discharge in order to work on strength, balance and endurance as well as decrease future fall risk. Pt should use her rollator for all ambulation after discharge. Pt will benefit from skilled PT services to address deficits in strength, balance, and mobility in order to return to full function at home.     Follow Up Recommendations Home health PT    Equipment Recommendations  None recommended by PT    Recommendations for Other Services       Precautions / Restrictions Precautions Precautions: Fall Restrictions Weight Bearing Restrictions: No      Mobility  Bed Mobility Overal bed mobility: Modified Independent             General bed mobility comments: HOB elevated at 30 degrees and heavy use of bed rails. Pt requires mild increase in time to come upright to sitting  Transfers Overall transfer level: Needs assistance Equipment used: Rolling walker (2 wheeled) Transfers: Sit to/from Stand Sit to Stand: Min guard         General transfer comment: Pt requires increased time to come to standing but is able to do so without external assist. Requires cues for safe hand placement during transfers. Able to control descent as well. Demonstrates good stability once upright in standing  Ambulation/Gait Ambulation/Gait assistance: Min guard Ambulation Distance (Feet): 150 Feet Assistive device: Rolling walker (2 wheeled) Gait Pattern/deviations: Decreased step length - right;Decreased step length - left Gait velocity: Decreased Gait velocity interpretation: <1.8 ft/sec, indicative of risk for recurrent falls General Gait Details: Pt ambulates one quarter of the way around  the RN station with rolling walker. Vitals monitored throughout ambulation. Pt reports mild DOE and HR increases to 115-120 bpm during ambulation. Pt denies chest pain or palpitations during ambulation. Gait speed is decreased but functional for household mobility. Pt  takes short shuffling steps during ambulation but does not stumble. Safe use of rolling walker demonstrated  Stairs            Wheelchair Mobility    Modified Rankin (Stroke Patients Only)       Balance Overall balance assessment: Needs assistance Sitting-balance support: No upper extremity supported Sitting balance-Leahy Scale: Good     Standing balance support: No upper extremity supported Standing balance-Leahy Scale: Fair Standing balance comment: Pt able to maintain feet apart and feet together balance without UE support. Positive Rhomberg and unable to attempt single leg balance due to falling                             Pertinent Vitals/Pain Pain Assessment: 0-10 Pain Location: Bilateral shoulder blades, chronic prior to admission for over 20 years. Pt reports that she goes to chiropractor for this problem Pain Intervention(s): Monitored during session    Home Living Family/patient expects to be discharged to:: Private residence Living Arrangements: Alone Available Help at Discharge: Family;Available PRN/intermittently Type of Home: Apartment Home Access: Level entry     Home Layout: One level Home Equipment: Cane - single point;Walker - 4 wheels;Shower seat      Prior Function Level of Independence: Independent with assistive device(s)         Comments: Mod indep with ADLs, household activities and limited community mobility; uses rollator both inside and outside of apartment. Denies fall history; denies home O2.  Does report household mobility progressively more difficulty due to worsening SOB in recent weeks/months.     Hand Dominance   Dominant Hand: Right    Extremity/Trunk Assessment   Upper Extremity Assessment: Generalized weakness           Lower Extremity Assessment: Generalized weakness         Communication   Communication: No difficulties  Cognition Arousal/Alertness: Awake/alert Behavior During Therapy: WFL for  tasks assessed/performed Overall Cognitive Status: Within Functional Limits for tasks assessed                      General Comments      Exercises     Assessment/Plan    PT Assessment Patient needs continued PT services  PT Problem List Decreased strength;Decreased activity tolerance;Decreased balance;Decreased mobility;Cardiopulmonary status limiting activity          PT Treatment Interventions DME instruction;Gait training;Therapeutic activities;Therapeutic exercise;Balance training;Neuromuscular re-education;Patient/family education    PT Goals (Current goals can be found in the Care Plan section)  Acute Rehab PT Goals Patient Stated Goal: Return home PT Goal Formulation: With patient Time For Goal Achievement: 08/20/16 Potential to Achieve Goals: Good    Frequency Min 2X/week   Barriers to discharge Decreased caregiver support Lives alone but states she has regular visits from family    Co-evaluation               End of Session Equipment Utilized During Treatment: Gait belt Activity Tolerance: Patient tolerated treatment well Patient left: in chair;with call bell/phone within reach;Other (comment) (Moderate fall risk of 7, no alarm required, Agrees call RN) Nurse Communication: Mobility status         Time: [phone] PT Time Calculation (  min) (ACUTE ONLY): 25 min   Charges:   PT Evaluation $PT Eval Moderate Complexity: 1 Procedure PT Treatments $Gait Training: 8-22 mins   PT G Codes:       Lyndel Safe Chivas Notz PT, DPT   Jakayden Cancio 08/06/2016, 10:37 AM

## 2016-08-06 NOTE — Care Management (Signed)
Required cardizem drip to convert to normal sinus rhythm.  PT consult is pending.

## 2016-08-07 DIAGNOSIS — I5033 Acute on chronic diastolic (congestive) heart failure: Secondary | ICD-10-CM

## 2016-08-07 DIAGNOSIS — L03116 Cellulitis of left lower limb: Secondary | ICD-10-CM

## 2016-08-07 DIAGNOSIS — I4891 Unspecified atrial fibrillation: Secondary | ICD-10-CM

## 2016-08-07 DIAGNOSIS — I10 Essential (primary) hypertension: Secondary | ICD-10-CM

## 2016-08-07 DIAGNOSIS — E876 Hypokalemia: Secondary | ICD-10-CM

## 2016-08-07 LAB — BASIC METABOLIC PANEL
Anion gap: 10 (ref 5–15)
BUN: 39 mg/dL — AB (ref 6–20)
CALCIUM: 9.2 mg/dL (ref 8.9–10.3)
CHLORIDE: 99 mmol/L — AB (ref 101–111)
CO2: 28 mmol/L (ref 22–32)
CREATININE: 1.31 mg/dL — AB (ref 0.44–1.00)
GFR calc non Af Amer: 43 mL/min — ABNORMAL LOW (ref >60)
GFR, EST AFRICAN AMERICAN: 49 mL/min — AB (ref >60)
Glucose, Bld: 150 mg/dL — ABNORMAL HIGH (ref 65–99)
Potassium: 5.2 mmol/L — ABNORMAL HIGH (ref 3.5–5.1)
SODIUM: 137 mmol/L (ref 135–145)

## 2016-08-07 LAB — GLUCOSE, CAPILLARY
GLUCOSE-CAPILLARY: 147 mg/dL — AB (ref 65–99)
GLUCOSE-CAPILLARY: 171 mg/dL — AB (ref 65–99)
Glucose-Capillary: 127 mg/dL — ABNORMAL HIGH (ref 65–99)
Glucose-Capillary: 194 mg/dL — ABNORMAL HIGH (ref 65–99)

## 2016-08-07 LAB — MAGNESIUM: Magnesium: 1.6 mg/dL — ABNORMAL LOW (ref 1.7–2.4)

## 2016-08-07 MED ORDER — MAGNESIUM OXIDE 400 (241.3 MG) MG PO TABS
400.0000 mg | ORAL_TABLET | Freq: Two times a day (BID) | ORAL | Status: DC
  Administered 2016-08-07 – 2016-08-09 (×5): 400 mg via ORAL
  Filled 2016-08-07 (×5): qty 1

## 2016-08-07 MED ORDER — DILTIAZEM HCL 30 MG PO TABS
30.0000 mg | ORAL_TABLET | Freq: Four times a day (QID) | ORAL | Status: DC
  Administered 2016-08-07 – 2016-08-09 (×8): 30 mg via ORAL
  Filled 2016-08-07 (×8): qty 1

## 2016-08-07 NOTE — Progress Notes (Signed)
Patient Name: Megan Cisneros Date of Encounter: 08/07/2016  Primary Cardiologist: Esmond Plants, MD  Hospital Problem List     Principal Problem:   Atrial fibrillation with RVR [facility]) Active Problems:   Bilateral leg edema   Morbid obesity due to excess calories (HCC)   Acute on chronic diastolic CHF (congestive heart failure) (HCC)   Essential hypertension   Hypokalemia   Hypomagnesemia   Cellulitis of left lower extremity   Chest pain   UTI (urinary tract infection)   Tinea corporis   Muscle weakness (generalized)   Notalgia     Subjective   Patient felt lightheaded this morning while seated in chair.  No palpitations or chest pain.  Gets out of breath with activity.  Leg rash unchanged; continues to itch from time to time.  Inpatient Medications    Scheduled Meds: . apixaban  5 mg Oral BID  . atorvastatin  80 mg Oral q1800  . cefTRIAXone  1 g Intravenous Daily  . digoxin  0.25 mg Oral Daily  . diltiazem  5 mg Intravenous Once  . docusate sodium  100 mg Oral BID  . fluconazole  150 mg Oral Weekly  . furosemide  40 mg Oral BID  . gabapentin  100 mg Oral Daily  . insulin aspart  0-5 Units Subcutaneous QHS  . insulin aspart  0-9 Units Subcutaneous TID WC  . insulin detemir  10 Units Subcutaneous QHS  . metoprolol tartrate  50 mg Oral Q6H  . multivitamin with minerals  1 tablet Oral Daily  . oxybutynin  5 mg Oral BID  . pantoprazole  40 mg Oral Daily  . potassium chloride  20 mEq Oral BID  . sodium chloride flush  3 mL Intravenous Q12H  . triamcinolone ointment   Topical BID   Continuous Infusions:  PRN Meds: acetaminophen **OR** acetaminophen, HYDROcodone-acetaminophen, ondansetron **OR** ondansetron (ZOFRAN) IV, polyethylene glycol   Vital Signs    Vitals:   08/07/16 0142 08/07/16 0325 08/07/16 0332 08/07/16 0915  BP: 120/69  (!) 142/65 116/79  Pulse: (!) 109  91 (!) 121  Resp:   16 18  Temp:   97.8 F (36.6 C) 97.6 F (36.4 C)  TempSrc:   Oral Oral    SpO2:   97% 97%  Weight:  184 lb 12.8 oz (83.8 kg)    Height:        Intake/Output Summary (Last 24 hours) at 08/07/16 1134 Last data filed at 08/07/16 1044  Gross per 24 hour  Intake              360 ml  Output             1650 ml  Net            -1290 ml   Filed Weights   08/05/16 1452 08/06/16 0435 08/07/16 0325  Weight: 185 lb (83.9 kg) 185 lb 9.6 oz (84.2 kg) 184 lb 12.8 oz (83.8 kg)    Physical Exam   GEN: Overweight woman seated in her room. She appears anxious but in no distress. HEENT: Grossly normal.  Neck: Supple, no JVD, carotid bruits, or masses. Cardiac: Tachycardic and irregular. No murmurs. 1+ calf edema bilaterally. Respiratory:  Normal work of breathing. Monice Lundy expiratory wheezes in the upper lung fields bilaterally. Otherwise fair air movement. No crackles. GI: Soft, nontender, nondistended, BS + x 4. MS: no deformity or atrophy. Skin: warm. Bilateral ankles demonstrate erythematous confluent papular rash. Antibiotic gel has recently been applied.  Neuro:  Strength and sensation are intact. Psych: AAOx3.  Normal affect.  Labs    CBC  Recent Labs  08/05/16 1114 08/06/16 0150  WBC 7.7 7.7  HGB 13.2 12.4  HCT 39.8 37.1  MCV 86.4 86.2  PLT 284 010   Basic Metabolic Panel  Recent Labs  08/06/16 0150 08/07/16 0530  NA 136 137  K 4.6 5.2*  CL 95* 99*  CO2 31 28  GLUCOSE 161* 150*  BUN 44* 39*  CREATININE 1.32* 1.31*  CALCIUM 9.0 9.2  MG 2.4 1.6*   Liver Function Tests No results for input(s): AST, ALT, ALKPHOS, BILITOT, PROT, ALBUMIN in the last 72 hours. No results for input(s): LIPASE, AMYLASE in the last 72 hours. Cardiac Enzymes  Recent Labs  08/05/16 1114 08/05/16 1458 08/06/16 0150  TROPONINI <0.03 <0.03 <0.03   BNP Invalid input(s): POCBNP D-Dimer No results for input(s): DDIMER in the last 72 hours. Hemoglobin A1C  Recent Labs  08/05/16 1458  HGBA1C 7.1*   Fasting Lipid Panel No results for input(s): CHOL, HDL,  LDLCALC, TRIG, CHOLHDL, LDLDIRECT in the last 72 hours. Thyroid Function Tests  Recent Labs  08/05/16 1619  TSH 3.136    Telemetry    Predominantly atrial flutter, though some very irregular periods may represent coarse atrial fibrillation. Heart rate from the high 90s to 140 - Personally Reviewed  ECG    08/05/16: Atypical atrial flutter with variable conduction and left axis deviation. Poor R-wave regression. - Personally Reviewed  Radiology    Dg Chest 2 View  Result Date: 08/05/2016 CLINICAL DATA:  Chest pain EXAM: CHEST  2 VIEW COMPARISON:  12/11/2015 FINDINGS: Mild cardiac enlargement. Aortic atherosclerosis noted. Mild diffuse pulmonary edema is identified concerning for CHF. Thickening of the scratch set fluid is identified extending along the fissures within the right lung. IMPRESSION: 1. Mild congestive heart failure. Electronically Signed   By: Kerby Moors M.D.   On: 08/05/2016 11:46    Cardiac Studies   Transthoracic echocardiogram (12/12/15): Normal LV size with moderate concentric hypertrophy and hyperdynamic contraction. No regional wall motion abnormalities. Mild mitral regurgitation. Mild left atrial enlargement. Normal RV size and function.  Patient Profile     60 y.o. woman with h/o atrial flutter on Eliquis, chronic diastolic CHF, PAD s/p RLE fem-fem artery bypass in [place], [place] in 2004, two TIA's/stroke in 2007 in [place], more recently CKD stage II over this past year, chronic RLE edema, chronic DOE, DM, HTN, and obesity who presented to [facility] ED on 12/7 with multiple complaints including atypical chest pain, SOB, and LE swelling. She was noted to be in atrial flutter with RVR.   Assessment & Plan    Atrial fibrillation/flutter: Telemetry demonstrates continued atrial fibrillation and flutter with suboptimally controlled heart rate, ranging from the 90s to 140s. She experienced some lightheadedness today, which may have been related. She is currently on metoprolol  50 mg every 6 and digoxin. The patient reported leg edema with calcium channel blockers in the past. She remains on apixaban, which she has been taking regularly even before admission.  Continue metoprolol and digoxin.  Start diltiazem 30 mg by mouth every 6 hours. Benefits and risks of this medication were discussed with the patient, who is in agreement with starting today.  Continue apixaban 5 mg twice a day.  If patient remains suboptimally rate controlled, we will plan for direct current cardioversion on Monday. After lengthy discussion, the patient is in agreement with this.  Will check digoxin  level with morning labs tomorrow.  Start magnesium oxide 400 mg twice a day for mild hypomagnesemia; target magnesium level of 2.0 or above.  Heart failure with preserved ejection fraction: The patient has mild chronic lower extremity edema but otherwise appears euvolemic. Her suboptimally controlled heart rate is likely exacerbating things.  Continue furosemide 40 mg twice a day.  Will hold standing potassium due to mild hyperkalemia on today's labs.  Recheck BMP tomorrow morning.  Hypertension: Blood pressure normal to mildly elevated.  Continue metoprolol.  Add diltiazem 30 mg every 6 hours.  Avoid restarting other home antihypertensive medications at this time to allow for up titration of diltiazem, as needed to achieve target heart rate.  Lower extremity rash: This has reportedly been chronic, the patient now notes it has been more paretic than normal.  Continue treatment per hospitalist service.  Urinary tract infection: No symptoms per patient at this time.  Continue treatment per hospitalist service.  Disposition:  Patient not ready for discharge today given suboptimal rate control of atrial fibrillation/flutter.   Signed, Nelva Bush, MD  08/07/2016, 11:34 AM

## 2016-08-08 LAB — URINE CULTURE: Special Requests: NORMAL

## 2016-08-08 LAB — GLUCOSE, CAPILLARY
GLUCOSE-CAPILLARY: 174 mg/dL — AB (ref 65–99)
GLUCOSE-CAPILLARY: 227 mg/dL — AB (ref 65–99)
Glucose-Capillary: 112 mg/dL — ABNORMAL HIGH (ref 65–99)
Glucose-Capillary: 256 mg/dL — ABNORMAL HIGH (ref 65–99)

## 2016-08-08 LAB — MAGNESIUM: MAGNESIUM: 1.6 mg/dL — AB (ref 1.7–2.4)

## 2016-08-08 LAB — BASIC METABOLIC PANEL
Anion gap: 12 (ref 5–15)
BUN: 33 mg/dL — AB (ref 6–20)
CALCIUM: 9.4 mg/dL (ref 8.9–10.3)
CO2: 27 mmol/L (ref 22–32)
CREATININE: 1.19 mg/dL — AB (ref 0.44–1.00)
Chloride: 96 mmol/L — ABNORMAL LOW (ref 101–111)
GFR calc Af Amer: 56 mL/min — ABNORMAL LOW (ref >60)
GFR calc non Af Amer: 48 mL/min — ABNORMAL LOW (ref >60)
Glucose, Bld: 111 mg/dL — ABNORMAL HIGH (ref 65–99)
Potassium: 4.2 mmol/L (ref 3.5–5.1)
SODIUM: 135 mmol/L (ref 135–145)

## 2016-08-08 LAB — DIGOXIN LEVEL: DIGOXIN LVL: 1.2 ng/mL (ref 0.8–2.0)

## 2016-08-08 MED ORDER — MAGNESIUM SULFATE 2 GM/50ML IV SOLN
2.0000 g | Freq: Once | INTRAVENOUS | Status: AC
  Filled 2016-08-08: qty 50

## 2016-08-08 MED ORDER — SODIUM CHLORIDE 0.9% FLUSH
3.0000 mL | Freq: Two times a day (BID) | INTRAVENOUS | Status: DC
  Administered 2016-08-08 – 2016-08-09 (×3): 3 mL via INTRAVENOUS

## 2016-08-08 MED ORDER — SODIUM CHLORIDE 0.9% FLUSH: 3.0000 mL | INTRAVENOUS | Status: DC | PRN

## 2016-08-08 MED ORDER — MAGNESIUM SULFATE 2 GM/50ML IV SOLN
2.0000 g | Freq: Once | INTRAVENOUS | Status: AC
  Administered 2016-08-08: 2 g via INTRAVENOUS
  Filled 2016-08-08: qty 50

## 2016-08-08 MED ORDER — LISINOPRIL 10 MG PO TABS
10.0000 mg | ORAL_TABLET | Freq: Every day | ORAL | Status: DC
  Administered 2016-08-09: 10 mg via ORAL
  Filled 2016-08-08: qty 1

## 2016-08-08 MED ORDER — POTASSIUM CHLORIDE CRYS ER 20 MEQ PO TBCR
20.0000 meq | EXTENDED_RELEASE_TABLET | Freq: Every day | ORAL | Status: DC
  Administered 2016-08-08 – 2016-08-09 (×2): 20 meq via ORAL
  Filled 2016-08-08 (×2): qty 1

## 2016-08-08 MED ORDER — INSULIN DETEMIR 100 UNIT/ML ~~LOC~~ SOLN
5.0000 [IU] | Freq: Every day | SUBCUTANEOUS | Status: DC
  Administered 2016-08-08: 5 [IU] via SUBCUTANEOUS
  Filled 2016-08-08 (×2): qty 0.05

## 2016-08-08 MED ORDER — SODIUM CHLORIDE 0.9 % IV SOLN
250.0000 mL | INTRAVENOUS | Status: DC
  Administered 2016-08-09: 08:00:00 via INTRAVENOUS

## 2016-08-08 MED ORDER — HYDROCORTISONE 1 % EX CREA
1.0000 "application " | TOPICAL_CREAM | Freq: Three times a day (TID) | CUTANEOUS | Status: DC | PRN
  Filled 2016-08-08: qty 28

## 2016-08-08 NOTE — Progress Notes (Signed)
[facility] [place] NAME: Megan Cisneros    MR#:  [id number]  DATE OF BIRTH:  April 19, 1956  SUBJECTIVE:  CHIEF COMPLAINT:  Patient denies any chest pain during my examination. Resting comfortably.  Denies any palpitations. HR was still fluctuating- cardio suggested change in meds.  REVIEW OF SYSTEMS:  CONSTITUTIONAL: No fever, fatigue . Reporting weakness.  EYES: No blurred or double vision.  EARS, NOSE, AND THROAT: No tinnitus or ear pain.  RESPIRATORY: No cough, reports exertional shortness of breath, denies wheezing or hemoptysis.  CARDIOVASCULAR: No chest pain, orthopnea, edema.  GASTROINTESTINAL: No nausea, vomiting, diarrhea or abdominal pain.  GENITOURINARY: No dysuria, hematuria.  ENDOCRINE: No polyuria, nocturia,  HEMATOLOGY: No anemia, easy bruising or bleeding SKIN: Leg redness No rash or lesion. MUSCULOSKELETAL: Has chronic back pain No joint pain or arthritis.   NEUROLOGIC: No tingling, numbness, weakness.  PSYCHIATRY: No anxiety or depression.   DRUG ALLERGIES:  No Known Allergies  VITALS:  Blood pressure (!) 144/74, pulse 85, temperature 98.3 F (36.8 C), resp. rate 16, height 5' (1.524 m), weight 82.5 kg (181 lb 12.8 oz), SpO2 93 %.  PHYSICAL EXAMINATION:  GENERAL:  60 y.o.-year-old patient lying in the bed with no acute distress.  EYES: Pupils equal, round, reactive to light and accommodation. No scleral icterus. Extraocular muscles intact.  HEENT: Head atraumatic, normocephalic. Oropharynx and nasopharynx clear.  NECK:  Supple, no jugular venous distention. No thyroid enlargement, no tenderness.  LUNGS: Normal breath sounds bilaterally, no wheezing, rales,rhonchi or crepitation. No use of accessory muscles of respiration.  CARDIOVASCULAR: S1, S2 normal. Tachycardia, No murmurs, rubs, or gallops.  ABDOMEN: Soft, nontender, nondistended. Bowel sounds present. No organomegaly or mass.  EXTREMITIES: Bilateral lower  extremity erythema more on the right lower extending to the left lower extremity , with a ring-shaped erythematous rash on the left foot with central clearing and raised margins No pedal edema, cyanosis, or clubbing.  NEUROLOGIC: Cranial nerves II through XII are intact. Muscle strength 5/5 in all extremities. Sensation intact. Gait not checked.  PSYCHIATRIC: The patient is alert and oriented x 3.  SKIN: No obvious rash, lesion, or ulcer.    LABORATORY PANEL:   CBC  Recent Labs Lab 08/06/16 0150  WBC 7.7  HGB 12.4  HCT 37.1  PLT 295   ------------------------------------------------------------------------------------------------------------------  Chemistries   Recent Labs Lab 08/08/16 0626  NA 135  K 4.2  CL 96*  CO2 27  GLUCOSE 111*  BUN 33*  CREATININE 1.19*  CALCIUM 9.4  MG 1.6*   ------------------------------------------------------------------------------------------------------------------  Cardiac Enzymes  Recent Labs Lab 08/06/16 0150  TROPONINI <0.03   ------------------------------------------------------------------------------------------------------------------  RADIOLOGY:  No results found.  EKG:   Orders placed or performed during the hospital encounter of 08/05/16  . EKG 12-Lead  . EKG 12-Lead    ASSESSMENT AND PLAN:   Megan Cisneros  is a 60 y.o. female with a known history of diastolic CHF, atrial flutter on eliquis, chronic right leg extremity edema, chronic dyspnea, h/o TIA and CVA, DM Presents to hospital secondary to worsening shortness of breath and chest pain.  #1 acute on chronic diastolic CHF exacerbation- -clinically better today  telemetry, -On IV Lasix twice a day. Appreciate: [facility] recommendations- Daily weights, salt restricted diet, strict input and output monitoring.  #2 atrial flutter with rapid ventricular response- Rate controlled today  continue cardiac medications. Digoxin 0.25 mg  daily by mouth -Patient is started on metoprolol 50  mg by mouth every 6 hours and discontinued atenolol On eliquis for anticoagulation. -Monitor closely for any dosage adjustments. - added cardizem as still having RVR.  #3 acute renal failure-could be cardiorenal syndrome. Creatinine 1.24-1.32. Check BMP in a.m. Monitor closely while on Lasix. Also has underlying CKD.   Avoid nephrotoxins. Hold metformin  #4 acute cellulitis of bilateral lower extremities on  chronic lower extremity edema-worse than baseline.   worsened with CHF Started her on IV Rocephin and itraconazole for tinea pedis -On Lasix. Has lower extremity rash on the right leg which could be psoriasis or eczema.  discontinue steroid ointment as it will make it worse  #5 history of CVA-no neurological deficits. On eliquis for anticoagulation due to history of atrial flutter.  #6 diabetes mellitus-hold metformin  Levemir and sliding scale insulin.  #7 DVT prophylaxis heparin already on eliquis.   #Abnormal urinalysis urine culture is done patient is on IV Rocephin- will give total 5 days.     All the records are reviewed and case discussed with Care Management/Social Workerr. Management plans discussed with the patient, family and they are in agreement.  CODE STATUS: fc  TOTAL TIME TAKING CARE OF THIS PATIENT: 35  minutes.   POSSIBLE D/C IN 2  DAYS, DEPENDING ON CLINICAL CONDITION.  Note: This dictation was prepared with Dragon dictation along with smaller phrase technology. Any transcriptional errors that result from this process are unintentional.   Vaughan Basta M.D on 08/08/2016 at 9:28 AM  Between 7am to 6pm - Pager - [phone] After 6pm go to [email or web address] - password [email or web address] [facility]  Office  [phone]  CC: Primary care physician; Donnie Coffin, MD

## 2016-08-08 NOTE — Progress Notes (Signed)
[facility] [place] NAME: Megan Cisneros    MR#:  [id number]  DATE OF BIRTH:  1955/09/24  SUBJECTIVE:  CHIEF COMPLAINT:  Patient denies any chest pain during my examination. Resting comfortably.  Denies any palpitations. HR was still fluctuating- cardio suggested change in meds.   Still having fast ventricular rate so cardio is planning for cardioversion tomorrow.  REVIEW OF SYSTEMS:  CONSTITUTIONAL: No fever, fatigue . Reporting weakness.  EYES: No blurred or double vision.  EARS, NOSE, AND THROAT: No tinnitus or ear pain.  RESPIRATORY: No cough, reports exertional shortness of breath, denies wheezing or hemoptysis.  CARDIOVASCULAR: No chest pain, orthopnea, edema.  GASTROINTESTINAL: No nausea, vomiting, diarrhea or abdominal pain.  GENITOURINARY: No dysuria, hematuria.  ENDOCRINE: No polyuria, nocturia,  HEMATOLOGY: No anemia, easy bruising or bleeding SKIN: Leg redness No rash or lesion. MUSCULOSKELETAL: Has chronic back pain No joint pain or arthritis.   NEUROLOGIC: No tingling, numbness, weakness.  PSYCHIATRY: No anxiety or depression.   DRUG ALLERGIES:  No Known Allergies  VITALS:  Blood pressure (!) 130/98, pulse 87, temperature 97.7 F (36.5 C), temperature source Oral, resp. rate 16, height 5' (1.524 m), weight 82.5 kg (181 lb 12.8 oz), SpO2 99 %.  PHYSICAL EXAMINATION:  GENERAL:  60 y.o.-year-old patient lying in the bed with no acute distress.  EYES: Pupils equal, round, reactive to light and accommodation. No scleral icterus. Extraocular muscles intact.  HEENT: Head atraumatic, normocephalic. Oropharynx and nasopharynx clear.  NECK:  Supple, no jugular venous distention. No thyroid enlargement, no tenderness.  LUNGS: Normal breath sounds bilaterally, no wheezing, rales,rhonchi or crepitation. No use of accessory muscles of respiration.  CARDIOVASCULAR: S1, S2 normal. Tachycardia, No murmurs, rubs, or gallops.   ABDOMEN: Soft, nontender, nondistended. Bowel sounds present. No organomegaly or mass.  EXTREMITIES: Bilateral lower extremity erythema more on the right lower extending to the left lower extremity , with a ring-shaped erythematous rash on the left foot with central clearing and raised margins No pedal edema, cyanosis, or clubbing.  NEUROLOGIC: Cranial nerves II through XII are intact. Muscle strength 5/5 in all extremities. Sensation intact. Gait not checked.  PSYCHIATRIC: The patient is alert and oriented x 3.  SKIN: No obvious rash, lesion, or ulcer.    LABORATORY PANEL:   CBC  Recent Labs Lab 08/06/16 0150  WBC 7.7  HGB 12.4  HCT 37.1  PLT 295   ------------------------------------------------------------------------------------------------------------------  Chemistries   Recent Labs Lab 08/08/16 0626  NA 135  K 4.2  CL 96*  CO2 27  GLUCOSE 111*  BUN 33*  CREATININE 1.19*  CALCIUM 9.4  MG 1.6*   ------------------------------------------------------------------------------------------------------------------  Cardiac Enzymes  Recent Labs Lab 08/06/16 0150  TROPONINI <0.03   ------------------------------------------------------------------------------------------------------------------  RADIOLOGY:  No results found.  EKG:   Orders placed or performed during the hospital encounter of 08/05/16  . EKG 12-Lead  . EKG 12-Lead    ASSESSMENT AND PLAN:   Megan Cisneros  is a 60 y.o. female with a known history of diastolic CHF, atrial flutter on eliquis, chronic right leg extremity edema, chronic dyspnea, h/o TIA and CVA, DM Presents to hospital secondary to worsening shortness of breath and chest pain.  #1 acute on chronic diastolic CHF exacerbation- -clinically better today  telemetry, -On IV Lasix twice a day. Appreciated [facility] recommendations- Daily weights, salt restricted diet, strict input and output monitoring.  #2  atrial flutter with rapid ventricular response- continue cardiac  medications. Digoxin 0.25 mg daily by mouth -Patient is started on metoprolol 50 mg by mouth every 6 hours and discontinued atenolol  - On eliquis for anticoagulation. - added cardizem as still having RVR. - scheduled for cardioversion tomorrow.  #3 acute renal failure-could be cardiorenal syndrome. Creatinine 1.24-1.32. Check BMP in a.m. Monitor closely while on Lasix. Also has underlying CKD.   Avoid nephrotoxins. Hold metformin  #4 acute cellulitis of bilateral lower extremities on  chronic lower extremity edema-worse than baseline.   worsened with CHF Started her on IV Rocephin and itraconazole for tinea pedis -On Lasix. Has lower extremity rash on the right leg which could be psoriasis or eczema.  discontinue steroid ointment as it will make it worse - stable.  #5 history of CVA-no neurological deficits. On eliquis for anticoagulation due to history of atrial flutter.  #6 diabetes mellitus-hold metformin  Levemir and sliding scale insulin.  #7 DVT prophylaxis heparin already on eliquis.   #Abnormal urinalysis urine culture is done patient is on IV Rocephin- will give total 5 days.     All the records are reviewed and case discussed with Care Management/Social Workerr. Management plans discussed with the patient, family and they are in agreement.  CODE STATUS: fc  TOTAL TIME TAKING CARE OF THIS PATIENT: 35  minutes.   POSSIBLE D/C IN 2  DAYS, DEPENDING ON CLINICAL CONDITION.  Note: This dictation was prepared with Dragon dictation along with smaller phrase technology. Any transcriptional errors that result from this process are unintentional.   Vaughan Basta M.D on 08/08/2016 at 3:33 PM  Between 7am to 6pm - Pager - [phone] After 6pm go to [email or web address] - password [email or web address]  Office  [phone]  CC: Primary care physician; Donnie Coffin, MD

## 2016-08-08 NOTE — Progress Notes (Signed)
Pt ordered to have 2G of magnesium administered  Via IV twice. I paged Dr. Anselm Jungling and he wants the pt to receive a total of 2G of magnesium. Second ordered dose not given.

## 2016-08-08 NOTE — Progress Notes (Signed)
Patient Name: Megan Cisneros Date of Encounter: 08/08/2016  Primary Cardiologist: Esmond Plants, MD  Hospital Problem List     Principal Problem:   Atrial fibrillation with RVR [facility]) Active Problems:   Bilateral leg edema   Morbid obesity due to excess calories (HCC)   Acute on chronic diastolic CHF (congestive heart failure) (HCC)   Essential hypertension   Hypokalemia   Hypomagnesemia   Cellulitis of left lower extremity   Chest pain   UTI (urinary tract infection)   Tinea corporis   Muscle weakness (generalized)   Notalgia     Subjective   Patient Feels well this morning. She reports dyspepsia yesterday afternoon that resolved with emesis times one. She no longer feels nauseated. She denies chest pain, shortness of breath, and lightheadedness. Overall, feels better with improved heart rate control. Leg rash unchanged.  Inpatient Medications    Scheduled Meds: . apixaban  5 mg Oral BID  . atorvastatin  80 mg Oral q1800  . cefTRIAXone  1 g Intravenous Daily  . diltiazem  30 mg Oral Q6H  . docusate sodium  100 mg Oral BID  . fluconazole  150 mg Oral Weekly  . furosemide  40 mg Oral BID  . gabapentin  100 mg Oral Daily  . insulin aspart  0-5 Units Subcutaneous QHS  . insulin aspart  0-9 Units Subcutaneous TID WC  . insulin detemir  10 Units Subcutaneous QHS  . magnesium oxide  400 mg Oral BID  . metoprolol tartrate  50 mg Oral Q6H  . multivitamin with minerals  1 tablet Oral Daily  . oxybutynin  5 mg Oral BID  . pantoprazole  40 mg Oral Daily  . sodium chloride flush  3 mL Intravenous Q12H  . triamcinolone ointment   Topical BID   Continuous Infusions:  PRN Meds: acetaminophen **OR** acetaminophen, HYDROcodone-acetaminophen, ondansetron **OR** ondansetron (ZOFRAN) IV, polyethylene glycol   Vital Signs    Vitals:   08/07/16 1808 08/07/16 1948 08/08/16 0521 08/08/16 0549  BP: (!) 141/73 (!) 151/50  (!) 144/74  Pulse: 96 95  85  Resp: '18 16  16  '$ Temp: 98.3 F  (36.8 C) 98.2 F (36.8 C)  98.3 F (36.8 C)  TempSrc: Oral Oral    SpO2: 90% 93%  93%  Weight:   181 lb 12.8 oz (82.5 kg)   Height:        Intake/Output Summary (Last 24 hours) at 08/08/16 0923 Last data filed at 08/08/16 0521  Gross per 24 hour  Intake              340 ml  Output             1750 ml  Net            -1410 ml   Filed Weights   08/06/16 0435 08/07/16 0325 08/08/16 0521  Weight: 185 lb 9.6 oz (84.2 kg) 184 lb 12.8 oz (83.8 kg) 181 lb 12.8 oz (82.5 kg)    Physical Exam   GEN: Overweight woman seated Comfortably on the edge of the bed, eating breakfast. HEENT: Grossly normal.  Neck: Supple, no JVD, carotid bruits, or masses. Cardiac: Irregularly irregular. No murmurs. 1+ calf edema bilaterally. Respiratory:  Normal work of breathing. Mildly diminished breath sounds throughout. No wheezes or crackles. GI: Soft, nontender, nondistended, BS + x 4. MS: no deformity or atrophy. Skin: warm. Bilateral ankles demonstrate erythematous confluent papular rash. Parents not significant change from yesterday. Neuro:  Strength and  sensation are intact. Psych: AAOx3.  Normal affect.  Labs    CBC  Recent Labs  08/05/16 1114 08/06/16 0150  WBC 7.7 7.7  HGB 13.2 12.4  HCT 39.8 37.1  MCV 86.4 86.2  PLT 284 333   Basic Metabolic Panel  Recent Labs  08/07/16 0530 08/08/16 0626  NA 137 135  K 5.2* 4.2  CL 99* 96*  CO2 28 27  GLUCOSE 150* 111*  BUN 39* 33*  CREATININE 1.31* 1.19*  CALCIUM 9.2 9.4  MG 1.6* 1.6*   Cardiac Enzymes  Recent Labs  08/05/16 1114 08/05/16 1458 08/06/16 0150  TROPONINI <0.03 <0.03 <0.03   Hemoglobin A1C  Recent Labs  08/05/16 1458  HGBA1C 7.1*   Thyroid Function Tests  Recent Labs  08/05/16 1619  TSH 3.136   Digoxin Level 08/08/16: 1.2  Telemetry    Predominantly atrial fibrillation. Heart rate 75-125 bpm. - Personally Reviewed  ECG    08/05/16: Atypical atrial flutter with variable conduction and left axis  deviation. Poor R-wave regression. - Personally Reviewed  Radiology    No results found.  Cardiac Studies   Transthoracic echocardiogram (12/12/15): Normal LV size with moderate concentric hypertrophy and hyperdynamic contraction. No regional wall motion abnormalities. Mild mitral regurgitation. Mild left atrial enlargement. Normal RV size and function.  Patient Profile     59 y.o. woman with h/o atrial flutter on Eliquis, chronic diastolic CHF, PAD s/p RLE fem-fem artery bypass in [place], [place] in 2004, two TIA's/stroke in 2007 in [place], more recently CKD stage II over this past year, chronic RLE edema, chronic DOE, DM, HTN, and obesity who presented to [facility] ED on 12/7 with multiple complaints including atypical chest pain, SOB, and LE swelling. She was noted to be in atrial flutter with RVR.   Assessment & Plan    Atrial fibrillation/flutter: Patient appears to remain in predominantly atrial fibrillation. Heart rate has improved with addition of diltiazem, though she continues to have periods of RVR up to 125 bpm. Overall, she feels better with her improved heart rate control. Digoxin level this morning is 1.2. We discussed rate control versus an attempt at rhythm control with DCCV. We have agreed to proceed with cardioversion tomorrow. The patient has been on continuous anticoagulation with apixaban for more than one month; we will therefore for go TEE prior to cardioversion.  Continue metoprolol and diltiazem.  Discontinue digoxin.  Continue apixaban 5 mg twice a day.  Plan for direct current cardioversion tomorrow. Patient will be nothing by mouth after midnight.  Continue oral magnesium. Will administer magnesium sulfate 2 g IV 1.  Will cut detemir insulin in half tonight, as she will be NPO after midnight.  Heart failure with preserved ejection fraction: The patient has mild chronic lower extremity edema but otherwise appears euvolemic. Potassium normal today.  Continue furosemide  40 mg twice a day.  Restart potassium chloride 20 mEq daily.  Hypertension: Blood pressure normal to mildly elevated.  Continue metoprolol and diltiazem.  Will restart lisinopril at 10 mg daily.  Continue to hold clonidine and spironolactone for the time being.  Lower extremity rash: This has reportedly been chronic and appear stable on exam today.  Continue treatment per hospitalist service.  Urinary tract infection: No symptoms per patient at this time.  Continue treatment per hospitalist service.  Signed, Nelva Bush, MD  08/08/2016, 9:23 AM

## 2016-08-09 ENCOUNTER — Inpatient Hospital Stay: Payer: Medicare Other | Admitting: Anesthesiology

## 2016-08-09 ENCOUNTER — Encounter
Admission: EM | Disposition: A | Discharge status: Home Health | Payer: Self-pay | Source: Home / Self Care | Attending: Internal Medicine

## 2016-08-09 ENCOUNTER — Encounter: Payer: Self-pay | Admitting: Anesthesiology

## 2016-08-09 DIAGNOSIS — I509 Heart failure, unspecified: Secondary | ICD-10-CM

## 2016-08-09 HISTORY — PX: ELECTROPHYSIOLOGIC STUDY: SHX172A

## 2016-08-09 LAB — BASIC METABOLIC PANEL
Anion gap: 10 (ref 5–15)
BUN: 33 mg/dL — AB (ref 6–20)
CO2: 33 mmol/L — ABNORMAL HIGH (ref 22–32)
CREATININE: 1.28 mg/dL — AB (ref 0.44–1.00)
Calcium: 9.4 mg/dL (ref 8.9–10.3)
Chloride: 93 mmol/L — ABNORMAL LOW (ref 101–111)
GFR calc Af Amer: 51 mL/min — ABNORMAL LOW (ref >60)
GFR, EST NON AFRICAN AMERICAN: 44 mL/min — AB (ref >60)
Glucose, Bld: 155 mg/dL — ABNORMAL HIGH (ref 65–99)
POTASSIUM: 4.1 mmol/L (ref 3.5–5.1)
SODIUM: 136 mmol/L (ref 135–145)

## 2016-08-09 LAB — CBC
HCT: 41.4 % (ref 35.0–47.0)
HEMOGLOBIN: 13.7 g/dL (ref 12.0–16.0)
MCH: 28.6 pg (ref 26.0–34.0)
MCHC: 33 g/dL (ref 32.0–36.0)
MCV: 86.5 fL (ref 80.0–100.0)
Platelets: 321 10*3/uL (ref 150–440)
RBC: 4.78 MIL/uL (ref 3.80–5.20)
RDW: 16.4 % — ABNORMAL HIGH (ref 11.5–14.5)
WBC: 7.6 10*3/uL (ref 3.6–11.0)

## 2016-08-09 LAB — GLUCOSE, CAPILLARY
GLUCOSE-CAPILLARY: 127 mg/dL — AB (ref 65–99)
Glucose-Capillary: 181 mg/dL — ABNORMAL HIGH (ref 65–99)

## 2016-08-09 LAB — MAGNESIUM: MAGNESIUM: 2.1 mg/dL (ref 1.7–2.4)

## 2016-08-09 SURGERY — CARDIOVERSION (CATH LAB)
Anesthesia: General

## 2016-08-09 SURGERY — CARDIOVERSION (CATH LAB)
Anesthesia: Monitor Anesthesia Care

## 2016-08-09 MED ORDER — GABAPENTIN 100 MG PO CAPS: 100.0000 mg | ORAL_CAPSULE | Freq: Every day | ORAL | 0 refills | Status: DC

## 2016-08-09 MED ORDER — DILTIAZEM HCL ER COATED BEADS 120 MG PO CP24: 120.0000 mg | ORAL_CAPSULE | Freq: Every day | ORAL | 0 refills | Status: DC

## 2016-08-09 MED ORDER — DILTIAZEM HCL ER COATED BEADS 120 MG PO CP24
120.0000 mg | ORAL_CAPSULE | Freq: Every day | ORAL | Status: DC
  Filled 2016-08-09: qty 1

## 2016-08-09 MED ORDER — INSULIN DETEMIR 100 UNIT/ML ~~LOC~~ SOLN: 5.0000 [IU] | Freq: Every day | SUBCUTANEOUS | 11 refills | Status: DC

## 2016-08-09 MED ORDER — PROPOFOL 10 MG/ML IV BOLUS
INTRAVENOUS | Status: DC | PRN
  Administered 2016-08-09: 60 mg via INTRAVENOUS

## 2016-08-09 MED ORDER — METOPROLOL TARTRATE 50 MG PO TABS
50.0000 mg | ORAL_TABLET | Freq: Two times a day (BID) | ORAL | Status: DC
  Administered 2016-08-09: 50 mg via ORAL

## 2016-08-09 MED ORDER — LISINOPRIL 10 MG PO TABS: 10.0000 mg | ORAL_TABLET | Freq: Every day | ORAL | 0 refills | Status: DC

## 2016-08-09 MED ORDER — METOPROLOL TARTRATE 50 MG PO TABS: 50.0000 mg | ORAL_TABLET | Freq: Two times a day (BID) | ORAL | 0 refills | Status: DC

## 2016-08-09 NOTE — Care Management Note (Signed)
Case Management Note  Patient Details  Name: Talita Recht MRN: [id number] Date of Birth: 04-22-1956  Subjective/Objective:      Discussed discharge planning with Mrs Korol who reports that she currently has no home health services. She chose [facility] as her provider for HH-PT services. A referral for HH-PT was texted to [facility]. Mrs Jakob's daughter will transport her home later today.               Action/Plan:   Expected Discharge Date:                  Expected Discharge Plan:     In-House Referral:     Discharge planning Services     Post Acute Care Choice:    Choice offered to:     DME Arranged:    DME Agency:     HH Arranged:  RN, PT [place] Agency:  [place]  Status of Service:  Completed, signed off  If discussed at [employer] of Stay Meetings, dates discussed:    Additional Comments:  Bernhardt Riemenschneider A, RN 08/09/2016, 3:02 PM

## 2016-08-09 NOTE — CV Procedure (Signed)
Cardioversion note: A standard informed consent was obtained. Timeout was performed. The pads were placed in the anterior posterior fashion. The patient was given propofol by the anesthesia team.  Successful cardioversion was performed with a 150 J. The patient converted to sinus rhythm. Pre-and post EKGs were reviewed. The patient tolerated the procedure with no immediate complications.  Recommendations: Continue anticoagulation. Adjust meds as needed.

## 2016-08-09 NOTE — Anesthesia Postprocedure Evaluation (Signed)
Anesthesia Post Note  Patient: Megan Cisneros  Procedure(s) Performed: Procedure(s) (LRB): CARDIOVERSION (N/A)  Patient location during evaluation: Cath Lab Anesthesia Type: General Level of consciousness: awake and alert Pain management: pain level controlled Vital Signs Assessment: post-procedure vital signs reviewed and stable Respiratory status: spontaneous breathing, nonlabored ventilation, respiratory function stable and patient connected to nasal cannula oxygen Cardiovascular status: blood pressure returned to baseline and stable Postop Assessment: no signs of nausea or vomiting Anesthetic complications: no    Last Vitals:  Vitals:   08/09/16 0910 08/09/16 1214  BP: 126/73 (!) 104/58  Pulse: 62 62  Resp: 18 20  Temp:  36.4 C    Last Pain:  Vitals:   08/09/16 1214  TempSrc: Oral  PainSc:                  Megan Cisneros

## 2016-08-09 NOTE — Progress Notes (Signed)
Discharge instruction explained to pt/ verbalized an understanding/ iv and tele removed/ RX given to pt/ transported off unit via wheelchair.

## 2016-08-09 NOTE — Anesthesia Preprocedure Evaluation (Signed)
Anesthesia Evaluation  Patient identified by MRN, date of birth, ID band Patient awake    Reviewed: Allergy & Precautions, H&P , NPO status , Patient's Chart, lab work & pertinent test results  History of Anesthesia Complications Negative for: history of anesthetic complications  Airway Mallampati: III  TM Distance: <3 FB Neck ROM: limited    Dental  (+) Poor Dentition, Missing, Chipped, Edentulous Upper   Pulmonary shortness of breath, former smoker,    Pulmonary exam normal breath sounds clear to auscultation       Cardiovascular Exercise Tolerance: Good hypertension, + Peripheral Vascular Disease and +CHF  (-) Past MI + dysrhythmias Atrial Fibrillation  Rhythm:irregular Rate:Normal     Neuro/Psych CVA, Residual Symptoms negative psych ROS   GI/Hepatic negative GI ROS, Neg liver ROS,   Endo/Other  diabetes, Type 2  Renal/GU negative Renal ROS  negative genitourinary   Musculoskeletal   Abdominal   Peds  Hematology negative hematology ROS (+)   Anesthesia Other Findings Past Medical History: No date: Atrial flutter (HCC)     Comment: a. Dx 11/2015 -->CHA2DS2VASc = 7-->Eliquis '5mg'$                BID. No date: Breast cancer ([place]) No date: Chronic diastolic CHF (congestive heart failur*     Comment: a. 11/2015 Echo: EF 60-65%, no rwma, mild MR,               mildly dil LA, nl RV fxn. No date: Chronic Dyspnea on Exertion     Comment: a. 2004 Stress test prior to LE               bypass->reportedly nl. No date: Diabetes mellitus without complication (HCC)     Comment: a. Dx as borderline diabetic in the               80's-->progressed over the years. No date: Essential hypertension No date: Lower extremity edema No date: Morbid obesity ([place]) No date: PVC's (premature ventricular contractions) No date: PVD (peripheral vascular disease) ([place])     Comment: a. 2004 s/p RLE bypass - complicated by   recurrent infection. No date: Stroke [facility])     Comment: a. TIA x 2;  b. Stroke in 2007 in [place].  Past Surgical History: No date: Cataract Surgery Right     Comment: 03/2014 No date: CESAREAN SECTION No date: LEG SURGERY No date: MASTECTOMY Left No date: right leg bypass Right  BMI    Body Mass Index:  35.25 kg/m      Reproductive/Obstetrics negative OB ROS                             Anesthesia Physical Anesthesia Plan  ASA: IV  Anesthesia Plan: General   Post-op Pain Management:    Induction:   Airway Management Planned:   Additional Equipment:   Intra-op Plan:   Post-operative Plan:   Informed Consent: I have reviewed the patients History and Physical, chart, labs and discussed the procedure including the risks, benefits and alternatives for the proposed anesthesia with the patient or authorized representative who has indicated his/her understanding and acceptance.   Dental Advisory Given  Plan Discussed with: Anesthesiologist, CRNA and Surgeon  Anesthesia Plan Comments:         Anesthesia Quick Evaluation

## 2016-08-09 NOTE — Transfer of Care (Signed)
Immediate Anesthesia Transfer of Care Note  Patient: Megan Cisneros  Procedure(s) Performed: Procedure(s): CARDIOVERSION (N/A)  Patient Location: [facility]  Anesthesia Type:General  Level of Consciousness: awake, alert  and oriented  Airway & Oxygen Therapy: Patient Spontanous Breathing and Patient connected to nasal cannula oxygen  Post-op Assessment: Report given to RN and Post -op Vital signs reviewed and stable  Post vital signs: Reviewed and stable  Last Vitals:  Vitals:   08/09/16 0805 08/09/16 0806  BP:  (!) 90/43  Pulse: (!) 58 (!) 58  Resp: 17 16  Temp:      Last Pain:  Vitals:   08/09/16 0718  TempSrc: Oral  PainSc: 8          Complications: No apparent anesthesia complications

## 2016-08-09 NOTE — Progress Notes (Signed)
Physical Therapy Treatment Patient Details Name: Megan Cisneros MRN: [id number] DOB: [birth year] Today's Date: 08/09/2016    History of Present Illness Megan Cisneros  is a 60 y.o. female with a known history of diastolic CHF, atrial flutter on eliquis, chronic right leg extremity edema, chronic dyspnea, h/o TIA and CVA, DM Presents to hospital secondary to worsening shortness of breath and chest pain. Patient has chronic dyspnea for several years, however feels like her dyspnea has worsened in the last couple of weeks. She has arthritis in her back and has back pain problems for which she is following with a chiropractor. Last few day she is pain having musculoskeletal left-sided chest pain radiating to the back. She does have chronic lower extremity edema worse on the right side secondary to her femorofemoral bypass surgery, but that has been increasing as well. She follows up with her cardiologist and saw him in about 4 months ago. She takes Lasix twice a day. Chest x-ray here reveals pulmonary edema. Denies any cough, congestion. Does have some postnasal drip. No fevers, Chills. no nausea or vomiting. So she is being admitted for CHF exacerbation. Pt currently on Cardizem drip which was started yesterday afternoon due to uncontrolled rate. Per NSG notes she converted to NSR overnight however telemetry still reading a-fib this AM but rate appears controlled    PT Comments    Pt demonstrates considerable improvement with ambulation on this date. She is s/p cardioversion this AM and HR never exceeds 75 bpm during lap around RN station. Pt does requires one standing rest break due to fatigue. Denies DOE and SaO2 remains at or above 95% on room air. Pt has no concerns regarding returning home at discharge. She would benefit from [facility] PT to work on balance, strength, and endurance. Pt will benefit from skilled PT services to address deficits in strength, balance, and mobility in order to return to full function  at home.    Follow Up Recommendations  Home health PT     Equipment Recommendations  None recommended by PT;Other (comment) (Encouraged use of her RW at home)    Recommendations for Other Services       Precautions / Restrictions Precautions Precautions: Fall Restrictions Weight Bearing Restrictions: No    Mobility  Bed Mobility Overal bed mobility: Modified Independent             General bed mobility comments: HOB elevated and UE assist on bed rails. Pt requires mild increase in time to come upright to sitting  Transfers Overall transfer level: Needs assistance Equipment used: Rolling walker (2 wheeled) Transfers: Sit to/from Stand Sit to Stand: Min guard         General transfer comment: Pt demonstrates slow rising but is steady without safety concerns. Safe hand placement demonstrated on this date with good stability with UE support in standing  Ambulation/Gait Ambulation/Gait assistance: Min guard Ambulation Distance (Feet): 220 Feet Assistive device: Rolling walker (2 wheeled) Gait Pattern/deviations: Decreased step length - right;Decreased step length - left Gait velocity: Decreased Gait velocity interpretation: <1.8 ft/sec, indicative of risk for recurrent falls General Gait Details: Pt able to ambulate a full lap around RN station on this date. Vitals monitored continuously during ambulation. HR never exceeds 75 bpm. Pt reports some mild fatigue but no DOE. Denies chest pain or palpitations. Pt is steady and stable with use of rolling walker. Gait speed is slow but adequate for full household and limited community mobility   Stairs  Wheelchair Mobility    Modified Rankin (Stroke Patients Only)       Balance Overall balance assessment: Needs assistance Sitting-balance support: No upper extremity supported Sitting balance-Leahy Scale: Good     Standing balance support: No upper extremity supported Standing balance-Leahy Scale:  Fair Standing balance comment: Pt able to maintain feet apart and feet together balance without UE support. Positive [place] and unable to attempt single leg balance due to falling                    Cognition Arousal/Alertness: Awake/alert Behavior During Therapy: WFL for tasks assessed/performed Overall Cognitive Status: Within Functional Limits for tasks assessed                      Exercises      General Comments        Pertinent Vitals/Pain Pain Assessment: 0-10 Pain Location: Bilateral shoulder blades, chronic prior to admission for over 20 years. Pt reports that she goes to chiropractor for this problem. Does not rate on this date and no signs of pain with ambulation Pain Intervention(s): Monitored during session    Home Living                      Prior Function            PT Goals (current goals can now be found in the care plan section) Acute Rehab PT Goals Patient Stated Goal: Return home PT Goal Formulation: With patient Time For Goal Achievement: 08/20/16 Potential to Achieve Goals: Good Progress towards PT goals: Progressing toward goals    Frequency    Min 2X/week      PT Plan Current plan remains appropriate    Co-evaluation             End of Session Equipment Utilized During Treatment: Gait belt Activity Tolerance: Patient tolerated treatment well Patient left: with call bell/phone within reach;in bed;with bed alarm set     Time: 1148-1200 PT Time Calculation (min) (ACUTE ONLY): 12 min  Charges:  $Gait Training: 8-22 mins                    G Codes:      Lyndel Safe Theodoro Koval PT, DPT   Kaedan Richert 08/09/2016, 12:29 PM

## 2016-08-09 NOTE — H&P (View-Only) (Signed)
Patient Name: Megan Cisneros Date of Encounter: 08/08/2016  Primary Cardiologist: Esmond Plants, MD  Hospital Problem List     Principal Problem:   Atrial fibrillation with RVR [facility]) Active Problems:   Bilateral leg edema   Morbid obesity due to excess calories (HCC)   Acute on chronic diastolic CHF (congestive heart failure) (HCC)   Essential hypertension   Hypokalemia   Hypomagnesemia   Cellulitis of left lower extremity   Chest pain   UTI (urinary tract infection)   Tinea corporis   Muscle weakness (generalized)   Notalgia     Subjective   Patient Feels well this morning. She reports dyspepsia yesterday afternoon that resolved with emesis times one. She no longer feels nauseated. She denies chest pain, shortness of breath, and lightheadedness. Overall, feels better with improved heart rate control. Leg rash unchanged.  Inpatient Medications    Scheduled Meds: . apixaban  5 mg Oral BID  . atorvastatin  80 mg Oral q1800  . cefTRIAXone  1 g Intravenous Daily  . diltiazem  30 mg Oral Q6H  . docusate sodium  100 mg Oral BID  . fluconazole  150 mg Oral Weekly  . furosemide  40 mg Oral BID  . gabapentin  100 mg Oral Daily  . insulin aspart  0-5 Units Subcutaneous QHS  . insulin aspart  0-9 Units Subcutaneous TID WC  . insulin detemir  10 Units Subcutaneous QHS  . magnesium oxide  400 mg Oral BID  . metoprolol tartrate  50 mg Oral Q6H  . multivitamin with minerals  1 tablet Oral Daily  . oxybutynin  5 mg Oral BID  . pantoprazole  40 mg Oral Daily  . sodium chloride flush  3 mL Intravenous Q12H  . triamcinolone ointment   Topical BID   Continuous Infusions:  PRN Meds: acetaminophen **OR** acetaminophen, HYDROcodone-acetaminophen, ondansetron **OR** ondansetron (ZOFRAN) IV, polyethylene glycol   Vital Signs    Vitals:   08/07/16 1808 08/07/16 1948 08/08/16 0521 08/08/16 0549  BP: (!) 141/73 (!) 151/50  (!) 144/74  Pulse: 96 95  85  Resp: '18 16  16  '$ Temp: 98.3 F  (36.8 C) 98.2 F (36.8 C)  98.3 F (36.8 C)  TempSrc: Oral Oral    SpO2: 90% 93%  93%  Weight:   181 lb 12.8 oz (82.5 kg)   Height:        Intake/Output Summary (Last 24 hours) at 08/08/16 0923 Last data filed at 08/08/16 0521  Gross per 24 hour  Intake              340 ml  Output             1750 ml  Net            -1410 ml   Filed Weights   08/06/16 0435 08/07/16 0325 08/08/16 0521  Weight: 185 lb 9.6 oz (84.2 kg) 184 lb 12.8 oz (83.8 kg) 181 lb 12.8 oz (82.5 kg)    Physical Exam   GEN: Overweight woman seated Comfortably on the edge of the bed, eating breakfast. HEENT: Grossly normal.  Neck: Supple, no JVD, carotid bruits, or masses. Cardiac: Irregularly irregular. No murmurs. 1+ calf edema bilaterally. Respiratory:  Normal work of breathing. Mildly diminished breath sounds throughout. No wheezes or crackles. GI: Soft, nontender, nondistended, BS + x 4. MS: no deformity or atrophy. Skin: warm. Bilateral ankles demonstrate erythematous confluent papular rash. Parents not significant change from yesterday. Neuro:  Strength and  sensation are intact. Psych: AAOx3.  Normal affect.  Labs    CBC  Recent Labs  08/05/16 1114 08/06/16 0150  WBC 7.7 7.7  HGB 13.2 12.4  HCT 39.8 37.1  MCV 86.4 86.2  PLT 284 497   Basic Metabolic Panel  Recent Labs  08/07/16 0530 08/08/16 0626  NA 137 135  K 5.2* 4.2  CL 99* 96*  CO2 28 27  GLUCOSE 150* 111*  BUN 39* 33*  CREATININE 1.31* 1.19*  CALCIUM 9.2 9.4  MG 1.6* 1.6*   Cardiac Enzymes  Recent Labs  08/05/16 1114 08/05/16 1458 08/06/16 0150  TROPONINI <0.03 <0.03 <0.03   Hemoglobin A1C  Recent Labs  08/05/16 1458  HGBA1C 7.1*   Thyroid Function Tests  Recent Labs  08/05/16 1619  TSH 3.136   Digoxin Level 08/08/16: 1.2  Telemetry    Predominantly atrial fibrillation. Heart rate 75-125 bpm. - Personally Reviewed  ECG    08/05/16: Atypical atrial flutter with variable conduction and left axis  deviation. Poor R-wave regression. - Personally Reviewed  Radiology    No results found.  Cardiac Studies   Transthoracic echocardiogram (12/12/15): Normal LV size with moderate concentric hypertrophy and hyperdynamic contraction. No regional wall motion abnormalities. Mild mitral regurgitation. Mild left atrial enlargement. Normal RV size and function.  Patient Profile     60 y.o. woman with h/o atrial flutter on Eliquis, chronic diastolic CHF, PAD s/p RLE fem-fem artery bypass in [place], [place] in 2004, two TIA's/stroke in 2007 in [place], more recently CKD stage II over this past year, chronic RLE edema, chronic DOE, DM, HTN, and obesity who presented to [facility] ED on 12/7 with multiple complaints including atypical chest pain, SOB, and LE swelling. She was noted to be in atrial flutter with RVR.   Assessment & Plan    Atrial fibrillation/flutter: Patient appears to remain in predominantly atrial fibrillation. Heart rate has improved with addition of diltiazem, though she continues to have periods of RVR up to 125 bpm. Overall, she feels better with her improved heart rate control. Digoxin level this morning is 1.2. We discussed rate control versus an attempt at rhythm control with DCCV. We have agreed to proceed with cardioversion tomorrow. The patient has been on continuous anticoagulation with apixaban for more than one month; we will therefore for go TEE prior to cardioversion.  Continue metoprolol and diltiazem.  Discontinue digoxin.  Continue apixaban 5 mg twice a day.  Plan for direct current cardioversion tomorrow. Patient will be nothing by mouth after midnight.  Continue oral magnesium. Will administer magnesium sulfate 2 g IV 1.  Will cut detemir insulin in half tonight, as she will be NPO after midnight.  Heart failure with preserved ejection fraction: The patient has mild chronic lower extremity edema but otherwise appears euvolemic. Potassium normal today.  Continue furosemide  40 mg twice a day.  Restart potassium chloride 20 mEq daily.  Hypertension: Blood pressure normal to mildly elevated.  Continue metoprolol and diltiazem.  Will restart lisinopril at 10 mg daily.  Continue to hold clonidine and spironolactone for the time being.  Lower extremity rash: This has reportedly been chronic and appear stable on exam today.  Continue treatment per hospitalist service.  Urinary tract infection: No symptoms per patient at this time.  Continue treatment per hospitalist service.  Signed, Nelva Bush, MD  08/08/2016, 9:23 AM

## 2016-08-09 NOTE — Progress Notes (Signed)
Patient Name: Megan Cisneros Date of Encounter: 08/09/2016  Primary Cardiologist: Esmond Plants, MD  Hospital Problem List     Principal Problem:   Atrial fibrillation with RVR [facility]) Active Problems:   Bilateral leg edema   Morbid obesity due to excess calories (HCC)   Acute on chronic diastolic CHF (congestive heart failure) (HCC)   Essential hypertension   Hypokalemia   Hypomagnesemia   Cellulitis of left lower extremity   Chest pain   UTI (urinary tract infection)   Tinea corporis   Muscle weakness (generalized)   Notalgia     Subjective   She underwent successful cardioversion to sinus rhythm this morning without complications.  Inpatient Medications    Scheduled Meds: . [MAR Hold] apixaban  5 mg Oral BID  . [MAR Hold] atorvastatin  80 mg Oral q1800  . [MAR Hold] cefTRIAXone  1 g Intravenous Daily  . [START ON 08/10/2016] diltiazem  120 mg Oral Daily  . [MAR Hold] docusate sodium  100 mg Oral BID  . [MAR Hold] fluconazole  150 mg Oral Weekly  . [MAR Hold] furosemide  40 mg Oral BID  . [MAR Hold] gabapentin  100 mg Oral Daily  . [MAR Hold] insulin aspart  0-5 Units Subcutaneous QHS  . [MAR Hold] insulin aspart  0-9 Units Subcutaneous TID WC  . [MAR Hold] insulin detemir  5 Units Subcutaneous QHS  . [MAR Hold] lisinopril  10 mg Oral Daily  . [MAR Hold] magnesium oxide  400 mg Oral BID  . [MAR Hold] metoprolol tartrate  50 mg Oral Q6H  . [MAR Hold] multivitamin with minerals  1 tablet Oral Daily  . [MAR Hold] oxybutynin  5 mg Oral BID  . [MAR Hold] pantoprazole  40 mg Oral Daily  . [MAR Hold] potassium chloride  20 mEq Oral Daily  . [MAR Hold] sodium chloride flush  3 mL Intravenous Q12H  . [MAR Hold] sodium chloride flush  3 mL Intravenous Q12H  . [MAR Hold] triamcinolone ointment   Topical BID   Continuous Infusions: . sodium chloride     PRN Meds: [MAR Hold] acetaminophen **OR** [MAR Hold] acetaminophen, [MAR Hold] HYDROcodone-acetaminophen, [MAR Hold]  hydrocortisone cream, [MAR Hold] ondansetron **OR** [MAR Hold] ondansetron (ZOFRAN) IV, [MAR Hold] polyethylene glycol, [MAR Hold] sodium chloride flush   Vital Signs    Vitals:   08/09/16 0810 08/09/16 0820 08/09/16 0830 08/09/16 0845  BP: (!) 90/43 (!) 97/51 (!) 114/56 121/60  Pulse: (!) 59 (!) 58 60 (!) 57  Resp: (!) '22 16 19 14  '$ Temp:      TempSrc:      SpO2: 97% 99% (!) 88% 97%  Weight:      Height:        Intake/Output Summary (Last 24 hours) at 08/09/16 0848 Last data filed at 08/09/16 0805  Gross per 24 hour  Intake              390 ml  Output             1575 ml  Net            -1185 ml   Filed Weights   08/08/16 0521 08/09/16 0419 08/09/16 0718  Weight: 181 lb 12.8 oz (82.5 kg) 180 lb 8 oz (81.9 kg) 180 lb (81.6 kg)    Physical Exam   GEN: Overweight woman seated Comfortably on the edge of the bed, eating breakfast. HEENT: Grossly normal.  Neck: Supple, no JVD, carotid bruits, or masses.  Cardiac: Regular rate and rhythm. No murmurs. 1+ calf edema bilaterally. Respiratory:  Normal work of breathing. Mildly diminished breath sounds throughout. No wheezes or crackles. GI: Soft, nontender, nondistended, BS + x 4. MS: no deformity or atrophy. Skin: warm. Bilateral ankles demonstrate erythematous confluent papular rash. Parents not significant change from yesterday. Neuro:  Strength and sensation are intact. Psych: AAOx3.  Normal affect.  Labs    CBC  Recent Labs  08/09/16 0516  WBC 7.6  HGB 13.7  HCT 41.4  MCV 86.5  PLT 428   Basic Metabolic Panel  Recent Labs  08/08/16 0626 08/09/16 0516  NA 135 136  K 4.2 4.1  CL 96* 93*  CO2 27 33*  GLUCOSE 111* 155*  BUN 33* 33*  CREATININE 1.19* 1.28*  CALCIUM 9.4 9.4  MG 1.6* 2.1   Cardiac Enzymes No results for input(s): CKTOTAL, CKMB, CKMBINDEX, TROPONINI in the last 72 hours. Hemoglobin A1C No results for input(s): HGBA1C in the last 72 hours. Thyroid Function Tests No results for input(s): TSH,  T4TOTAL, T3FREE, THYROIDAB in the last 72 hours.  Invalid input(s): FREET3 Digoxin Level 08/08/16: 1.2  Telemetry    Predominantly atrial fibrillation. Heart rate 75-125 bpm. - Personally Reviewed. Converted to sinus rhythm with cardioversion.  ECG    08/05/16: Atypical atrial flutter with variable conduction and left axis deviation. Poor R-wave regression. - Personally Reviewed. Post-cardioversion ECG showed normal sinus rhythm.  Radiology    No results found.  Cardiac Studies   Transthoracic echocardiogram (12/12/15): Normal LV size with moderate concentric hypertrophy and hyperdynamic contraction. No regional wall motion abnormalities. Mild mitral regurgitation. Mild left atrial enlargement. Normal RV size and function.  Patient Profile     60 y.o. woman with h/o atrial flutter on Eliquis, chronic diastolic CHF, PAD s/p RLE fem-fem artery bypass in [place], [place] in 2004, two TIA's/stroke in 2007 in [place], more recently CKD stage II over this past year, chronic RLE edema, chronic DOE, DM, HTN, and obesity who presented to [facility] ED on 12/7 with multiple complaints including atypical chest pain, SOB, and LE swelling. She was noted to be in atrial flutter with RVR.   Assessment & Plan    Atrial fibrillation/flutter:  Status post successful cardioversion to normal sinus rhythm this morning.  Continue metoprolol 50 mg twice daily and diltiazem.  I switched diltiazem to long-acting 120 mg once daily.  Continue apixaban 5 mg twice a day.   Heart failure with preserved ejection fraction: The patient has mild chronic lower extremity edema but otherwise appears euvolemic. Potassium normal today.  Decrease furosemide to 40 mg once a day.  Continue potassium chloride 20 mEq daily.  Hypertension: Blood pressure normal to mildly elevated.  Continue metoprolol and diltiazem.  Continue  lisinopril at 10 mg daily.  Continue to hold clonidine and spironolactone for the time being.  Lower  extremity rash: This has reportedly been chronic and appear stable on exam today.  Continue treatment per hospitalist service.  The patient can be discharged home from a cardiac standpoint if no other medical issues. Follow up in 2 weeks with Dr. Rockey Situ.   Signed, [name], MD  08/09/2016, 8:48 AM

## 2016-08-09 NOTE — Interval H&P Note (Signed)
History and Physical Interval Note:  08/09/2016 7:56 AM  Megan Cisneros  has presented today for surgery, with the diagnosis of atrial fib  The various methods of treatment have been discussed with the patient and family. After consideration of risks, benefits and other options for treatment, the patient has consented to  Procedure(s): CARDIOVERSION (N/A) as a surgical intervention .  The patient's history has been reviewed, patient examined, no change in status, stable for surgery.  I have reviewed the patient's chart and labs.  Questions were answered to the patient's satisfaction.     [name]

## 2016-08-10 NOTE — Discharge Summary (Signed)
[facility] NAME: Megan Cisneros    MR#:  [id number]  DATE OF BIRTH:  April 12, 1956  DATE OF ADMISSION:  08/05/2016 ADMITTING PHYSICIAN: Gladstone Lighter, MD  DATE OF DISCHARGE: 08/09/2016  5:04 PM  PRIMARY CARE PHYSICIAN: Donnie Coffin, MD    ADMISSION DIAGNOSIS:  Chest wall pain [R07.89] Acute on chronic diastolic congestive heart failure (HCC) [I50.33] Cellulitis of left lower extremity [L03.116] Atrial fibrillation with RVR (HCC) [I48.91] Dyspnea, unspecified type [R06.00] Chest pain, unspecified type [R07.9]  DISCHARGE DIAGNOSIS:  Principal Problem:   Atrial fibrillation with RVR ([place]) Active Problems:   Bilateral leg edema   Morbid obesity due to excess calories (HCC)   Acute on chronic diastolic CHF (congestive heart failure) (HCC)   Essential hypertension   Hypokalemia   Hypomagnesemia   Cellulitis of left lower extremity   Chest pain   UTI (urinary tract infection)   Tinea corporis   Muscle weakness (generalized)   Notalgia   SECONDARY DIAGNOSIS:   Past Medical History:  Diagnosis Date  . Atrial flutter ([place])    a. Dx 11/2015 -->CHA2DS2VASc = 7-->Eliquis '5mg'$  BID.  Marland Kitchen Breast cancer ([place])   . Chronic diastolic CHF (congestive heart failure) ([place])    a. 11/2015 Echo: EF 60-65%, no rwma, mild MR, mildly dil LA, nl RV fxn.  . Chronic Dyspnea on Exertion    a. 2004 Stress test prior to LE bypass->reportedly nl.  . Diabetes mellitus without complication ([place])    a. Dx as borderline diabetic in the 80's-->progressed over the years.  . Essential hypertension   . Lower extremity edema   . Morbid obesity ([place])   . PVC's (premature ventricular contractions)   . PVD (peripheral vascular disease) ([place])    a. 2004 s/p RLE bypass - complicated by recurrent infection.  . Stroke [facility])    a. TIA x 2;  b. Stroke in 2007 in [place].    HOSPITAL COURSE:   #1 acute on chronic diastolic CHF  exacerbation- -clinically better today  telemetry, -On IV Lasix twice a day. Appreciated [facility] recommendations- Daily weights, salt restricted diet, strict input and output monitoring.  #2 atrial flutter with rapid ventricular response- continue cardiac medications. Digoxin 0.25 mg daily by mouth -Patient is started on metoprolol 50 mg by mouth every 6 hours and discontinued atenolol  - On eliquis for anticoagulation. - added cardizem as still having RVR. - scheduled for cardioversion , and converted to sinus rhythm after that. - discharge and advise to follow in cardiology clinic.  #3 acute renal failure-could be cardiorenal syndrome. Creatinine 1.24-1.32. Check BMP in a.m. Monitor closely while on Lasix. Also has underlying CKD.   Avoid nephrotoxins. Hold metformin  kidney func stable at 1.28 creatinine.  #4 acute cellulitis of bilateral lower extremities on  chronic lower extremity edema-worse than baseline.   worsened with CHF Started her on IV Rocephin and itraconazole for tinea pedis -On Lasix. Has lower extremity rash on the right leg which could be psoriasis or eczema.  discontinue steroid ointment as it will make it worse - stable.  #5 history of CVA-no neurological deficits. On eliquis for anticoagulation due to history of atrial flutter.  #6 diabetes mellitus-hold metformin  Levemir and sliding scale insulin.  #7 DVT prophylaxis heparin already on eliquis.   #Abnormal urinalysis urine culture is done patient is on IV Rocephin- will give total 5 days.   DISCHARGE CONDITIONS:   Stable.  CONSULTS OBTAINED:  Treatment Team:  Minna Merritts, MD  DRUG ALLERGIES:  No Known Allergies  DISCHARGE MEDICATIONS:   Discharge Medication List as of 08/09/2016  3:35 PM    START taking these medications   Details  diltiazem (CARDIZEM CD) 120 MG 24 hr capsule Take 1 capsule (120 mg total) by mouth daily., Starting Tue 08/10/2016, Print     metoprolol (LOPRESSOR) 50 MG tablet Take 1 tablet (50 mg total) by mouth 2 (two) times daily., Starting Mon 08/09/2016, Print      CONTINUE these medications which have CHANGED   Details  insulin detemir (LEVEMIR) 100 UNIT/ML injection Inject 0.05 mLs (5 Units total) into the skin at bedtime., Starting Mon 08/09/2016, Normal    lisinopril (PRINIVIL,ZESTRIL) 10 MG tablet Take 1 tablet (10 mg total) by mouth daily., Starting Tue 08/10/2016, Print      CONTINUE these medications which have NOT CHANGED   Details  apixaban (ELIQUIS) 5 MG TABS tablet Take 1 tablet (5 mg total) by mouth 2 (two) times daily., Starting Wed 01/28/2016, Normal    atorvastatin (LIPITOR) 80 MG tablet Take 80 mg by mouth daily at 6 PM. , Starting Tue 12/30/2015, Historical Med    furosemide (LASIX) 40 MG tablet Take 1 tablet (40 mg total) by mouth 2 (two) times daily., Starting Mon 03/22/2016, Normal    metFORMIN (GLUCOPHAGE) 500 MG tablet Take 1,000 mg by mouth 2 (two) times daily. , Until Discontinued, Historical Med    Multiple Vitamin (MULTIVITAMIN WITH MINERALS) TABS tablet Take 1 tablet by mouth daily., Historical Med    omeprazole (PRILOSEC) 20 MG capsule Take 20 mg by mouth daily., Until Discontinued, Historical Med    oxybutynin (DITROPAN) 5 MG tablet Take 5 mg by mouth 2 (two) times daily., Until Discontinued, Historical Med    VENTOLIN HFA 108 (90 Base) MCG/ACT inhaler Inhale 2 puffs into the lungs every 6 (six) hours as needed. , Starting Sat 12/13/2015, Historical Med    gabapentin (NEURONTIN) 100 MG capsule Take 1 capsule (100 mg total) by mouth daily., Starting Tue 07/13/2016, Until Wed 07/13/2017, Print      STOP taking these medications     atenolol (TENORMIN) 25 MG tablet      cloNIDine (CATAPRES) 0.1 MG tablet      spironolactone (ALDACTONE) 25 MG tablet          DISCHARGE INSTRUCTIONS:    Follow with cardiology in 1 week.  If you experience worsening of your admission symptoms,  develop shortness of breath, life threatening emergency, suicidal or homicidal thoughts you must seek medical attention immediately by calling 911 or calling your MD immediately  if symptoms less severe.  You Must read complete instructions/literature along with all the possible adverse reactions/side effects for all the Medicines you take and that have been prescribed to you. Take any new Medicines after you have completely understood and accept all the possible adverse reactions/side effects.   Please note  You were cared for by a hospitalist during your hospital stay. If you have any questions about your discharge medications or the care you received while you were in the hospital after you are discharged, you can call the unit and asked to speak with the hospitalist on call if the hospitalist that took care of you is not available. Once you are discharged, your primary care physician will handle any further medical issues. Please note that NO REFILLS for any discharge medications will be authorized once you are discharged, as it  is imperative that you return to your primary care physician (or establish a relationship with a primary care physician if you do not have one) for your aftercare needs so that they can reassess your need for medications and monitor your lab values.    Today   CHIEF COMPLAINT:   Chief Complaint  Patient presents with  . Chest Pain    HISTORY OF PRESENT ILLNESS:  Megan Cisneros  is a 60 y.o. female with a known history of diastolic CHF, atrial flutter on eliquis, chronic right leg extremity edema, chronic dyspnea, h/o TIA and CVA, DM Presents to hospital secondary to worsening shortness of breath and chest pain. Patient has chronic dyspnea for several years, however feels like her dyspnea has worsened in the last couple of weeks. She has arthritis in her back and has back pain problems for which she is following with a chiropractor. Last few day she is pain having  musculoskeletal left-sided chest pain radiating to the back. She does have chronic lower extremity edema worse on the right side secondary to her femorofemoral bypass surgery, but that has been increasing as well. She follows up with her cardiologist and saw him in about 4 months ago. She takes Lasix twice a day. Chest x-ray here reveals pulmonary edema. Denies any cough, congestion. Does have some postnasal drip. No fevers, Chills. no nausea or vomiting. So she is being admitted for CHF exacerbation.  VITAL SIGNS:  Blood pressure (!) 104/58, pulse 62, temperature 97.6 F (36.4 C), temperature source Oral, resp. rate 20, height 5' (1.524 m), weight 81.6 kg (180 lb), SpO2 95 %.  I/O:   Intake/Output Summary (Last 24 hours) at 08/10/16 0733 Last data filed at 08/09/16 1452  Gross per 24 hour  Intake              580 ml  Output              700 ml  Net             -120 ml    PHYSICAL EXAMINATION:   GENERAL:  60 y.o.-year-old patient lying in the bed with no acute distress.  EYES: Pupils equal, round, reactive to light and accommodation. No scleral icterus. Extraocular muscles intact.  HEENT: Head atraumatic, normocephalic. Oropharynx and nasopharynx clear.  NECK:  Supple, no jugular venous distention. No thyroid enlargement, no tenderness.  LUNGS: Normal breath sounds bilaterally, no wheezing, rales,rhonchi or crepitation. No use of accessory muscles of respiration.  CARDIOVASCULAR: S1, S2 normal. Tachycardia, No murmurs, rubs, or gallops.  ABDOMEN: Soft, nontender, nondistended. Bowel sounds present. No organomegaly or mass.  EXTREMITIES: Bilateral lower extremity erythema more on the right lower extending to the left lower extremity , with a ring-shaped erythematous rash on the left foot with central clearing and raised margins No pedal edema, cyanosis, or clubbing.  NEUROLOGIC: Cranial nerves II through XII are intact. Muscle strength 5/5 in all extremities. Sensation intact. Gait not  checked.  PSYCHIATRIC: The patient is alert and oriented x 3.  SKIN: No obvious rash, lesion, or ulcer.   DATA REVIEW:   CBC  Recent Labs Lab 08/09/16 0516  WBC 7.6  HGB 13.7  HCT 41.4  PLT 321    Chemistries   Recent Labs Lab 08/09/16 0516  NA 136  K 4.1  CL 93*  CO2 33*  GLUCOSE 155*  BUN 33*  CREATININE 1.28*  CALCIUM 9.4  MG 2.1    Cardiac Enzymes  Recent Labs Lab 08/06/16  [place] <0.03    Microbiology Results  Results for orders placed or performed during the hospital encounter of 08/05/16  Urine culture     Status: Abnormal   Collection Time: 08/06/16  9:36 PM  Result Value Ref Range Status   Specimen Description URINE, RANDOM  Final   Special Requests Normal  Final   Culture (A)  Final    <10,000 COLONIES/mL INSIGNIFICANT GROWTH Performed at [facility]    Report Status 08/08/2016 FINAL  Final    RADIOLOGY:  No results found.  EKG:   Orders placed or performed during the hospital encounter of 08/05/16  . EKG 12-Lead  . EKG 12-Lead  . EKG 12-Lead  . EKG 12-Lead  . EKG 12-Lead  . EKG 12-Lead      Management plans discussed with the patient, family and they are in agreement.  CODE STATUS:  Code Status History    Date Active Date Inactive Code Status Order ID Comments User Context   08/05/2016  1:47 PM 08/09/2016  8:09 PM Full Code [id number]  Gladstone Lighter, MD ED   12/12/2015 12:41 AM 12/13/2015  4:13 PM Full Code [id number]  Fritzi Mandes, MD ED      TOTAL TIME TAKING CARE OF THIS PATIENT: 35 minutes.    Vaughan Basta M.D on 08/10/2016 at 7:33 AM  Between 7am to 6pm - Pager - [phone]  After 6pm go to [email or web address] - password [email or web address] [facility]  Office  [phone]  CC: Primary care physician; Donnie Coffin, MD   Note: This dictation was prepared with Dragon dictation along with smaller phrase technology. Any transcriptional errors that result from this process are  unintentional.

## 2016-08-11 ENCOUNTER — Ambulatory Visit (INDEPENDENT_AMBULATORY_CARE_PROVIDER_SITE_OTHER): Payer: Medicare Other | Admitting: Internal Medicine

## 2016-08-11 ENCOUNTER — Encounter: Payer: Self-pay | Admitting: Internal Medicine

## 2016-08-11 VITALS — BP 128/80 | HR 67 | Ht 60.0 in | Wt 183.5 lb

## 2016-08-11 DIAGNOSIS — I481 Persistent atrial fibrillation: Secondary | ICD-10-CM

## 2016-08-11 DIAGNOSIS — I5030 Unspecified diastolic (congestive) heart failure: Secondary | ICD-10-CM

## 2016-08-11 DIAGNOSIS — R0989 Other specified symptoms and signs involving the circulatory and respiratory systems: Secondary | ICD-10-CM | POA: Diagnosis not present

## 2016-08-11 DIAGNOSIS — R1310 Dysphagia, unspecified: Secondary | ICD-10-CM | POA: Diagnosis not present

## 2016-08-11 DIAGNOSIS — R1319 Other dysphagia: Secondary | ICD-10-CM

## 2016-08-11 DIAGNOSIS — I4819 Other persistent atrial fibrillation: Secondary | ICD-10-CM

## 2016-08-11 MED ORDER — METOPROLOL TARTRATE 25 MG PO TABS: 25.0000 mg | ORAL_TABLET | Freq: Two times a day (BID) | ORAL | 2 refills | Status: DC

## 2016-08-11 NOTE — Progress Notes (Signed)
Follow-up Outpatient Visit Date: 08/11/2016  Chief Complaint: Follow-up atrial fibrillation and HFpEF  HPI:  Megan Cisneros is a 60 y.o. year-old female with history of atrial fibrillation status post recent cardioversion, HFpEF, hypertension, and remote stroke, who presents for follow-up of atrial fibrillation and HFpEF.  The patient was discharged from [facility] 2 days ago after being admitted with fluid retention and acute kidney injury in the setting of chronic atrial fibrillation/flutter and HFpEF.  She also had suspected bilateral lower extremity cellulitis and cystitis.  She was diuresed with improvement in her leg swelling.  Rate control proved difficult, and the patient ultimately underwent DCCV with maintenance of sinus rhythm.  She remained on apixaban.  Today, the patient reports feeling fatigued but without chest pain, palpitations, lightheadedness, orthopnea, and PND.  She has stable DOE, that has not changed much compared with before her recent admission.  She remains on her discharge medications as prescribed, including metoprolol, diltiazem, and apixaban.  She has not had any bleeding.  Her edema is stable and her ankle rash has almost completely resolved.  Patient continues to have a cough with a sensation of mucus in her chest. She's been unable to bring much up. She says that he also feels as though food gets stuck in her chest after eating.  --------------------------------------------------------------------------------------------------  Cardiovascular History & Procedures: Cardiovascular Problems:  Atrial fibrillation/flutter  HFpEF  Risk Factors:  Hypertension, hyperlipidemia, and prior stroke  Cath/PCI:  None  CV Surgery:  None  EP Procedures and Devices:  DCCV (08/09/16)  Non-Invasive Evaluation(s):  TTE (12/12/15): Normal LV size with moderate LVH. LV contraction is normal to hyperdynamic with an EF of at least 65%. No regional wall motion abnormalities. Mild  MR. Mild left atrial enlargement. Normal RV size and function.  Recent CV Pertinent Labs: Lab Results  Component Value Date   CHOL 171 04/04/2014   HDL 31 (L) 04/04/2014   LDLCALC 70 04/04/2014   TRIG 351 (H) 04/04/2014   BNP 355.0 (H) 08/05/2016   K 4.1 08/09/2016   K 4.3 04/04/2014   MG 2.1 08/09/2016   BUN 33 (H) 08/09/2016   BUN 32 (H) 04/04/2014   CREATININE 1.28 (H) 08/09/2016   CREATININE 1.17 04/04/2014    Past medical and surgical history were reviewed and updated in EPIC.   Outpatient Encounter Prescriptions as of 08/11/2016  Medication Sig  . apixaban (ELIQUIS) 5 MG TABS tablet Take 1 tablet (5 mg total) by mouth 2 (two) times daily.  Marland Kitchen atorvastatin (LIPITOR) 80 MG tablet Take 80 mg by mouth daily at 6 PM.   . diltiazem (CARDIZEM CD) 120 MG 24 hr capsule Take 1 capsule (120 mg total) by mouth daily.  . furosemide (LASIX) 40 MG tablet Take 1 tablet (40 mg total) by mouth 2 (two) times daily. (Patient taking differently: Take 30 mg by mouth daily. )  . gabapentin (NEURONTIN) 100 MG capsule Take 1 capsule (100 mg total) by mouth daily.  . insulin detemir (LEVEMIR) 100 UNIT/ML injection Inject 0.05 mLs (5 Units total) into the skin at bedtime. (Patient taking differently: Inject 10 Units into the skin at bedtime. )  . lisinopril (PRINIVIL,ZESTRIL) 10 MG tablet Take 1 tablet (10 mg total) by mouth daily.  . metFORMIN (GLUCOPHAGE) 500 MG tablet Take 1,000 mg by mouth 2 (two) times daily.   . Multiple Vitamin (MULTIVITAMIN WITH MINERALS) TABS tablet Take 1 tablet by mouth daily.  Marland Kitchen omeprazole (PRILOSEC) 20 MG capsule Take 20 mg by mouth daily.  Marland Kitchen  oxybutynin (DITROPAN) 5 MG tablet Take 5 mg by mouth 2 (two) times daily.  . VENTOLIN HFA 108 (90 Base) MCG/ACT inhaler Inhale 2 puffs into the lungs every 6 (six) hours as needed.   . [DISCONTINUED] metoprolol (LOPRESSOR) 50 MG tablet Take 1 tablet (50 mg total) by mouth 2 (two) times daily.  . metoprolol tartrate (LOPRESSOR) 25 MG  tablet Take 1 tablet (25 mg total) by mouth 2 (two) times daily.   No facility-administered encounter medications on file as of 08/11/2016.     Allergies: Patient has no known allergies.  Social History   Social History  . Marital status: Widowed    Spouse name: N/A  . Number of children: N/A  . Years of education: N/A   Occupational History  . Not on file.   Social History Main Topics  . Smoking status: Former Research scientist (life sciences)  . Smokeless tobacco: Former Systems developer  . Alcohol use No  . Drug use: No  . Sexual activity: Not on file   Other Topics Concern  . Not on file   Social History Narrative   Lives in [place] by herself.  Originally from [place], [place], later moved to [place] and then [place].  Has been in [place] for 8 yrs.  Family nearby.  Does not routinely exercise.   Has a cane and a walker    Family History  Problem Relation Age of Onset  . Heart attack Father     died in his early 70's.  . Diabetes Father   . Cancer Mother     deceased  . Colon cancer Mother   . Cancer Brother     multiple cancers, deceased.  . Leukemia Brother   . Liver cancer Brother   . Pancreatic cancer Brother     Review of Systems: A 12-system review of systems was performed and was negative except as noted in the HPI.  --------------------------------------------------------------------------------------------------  Physical Exam: BP 128/80 (BP Location: Left Arm, Patient Position: Sitting, Cuff Size: Normal)   Pulse 67   Ht 5' (1.524 m)   Wt 183 lb 8 oz (83.2 kg)   BMI 35.84 kg/m   General:  Obese woman seated comfortably in the exam room. HEENT: No conjunctival pallor or scleral icterus.  Moist mucous membranes.  OP clear. Neck: Supple without lymphadenopathy, thyromegaly, JVD, or HJR.  No carotid bruit. Lungs: Normal work of breathing.  Clear to auscultation bilaterally without wheezes or crackles. Heart: Regular rate and rhythm without murmurs, rubs, or gallops.  Non-displaced PMI. Abd: Bowel  sounds present.  Soft, NT/ND without hepatosplenomegaly Ext: Trace pretibial edema bilaterally.  Radial, PT, and DP pulses are 2+ bilaterally. Skin: warm and dry; chronic skilled discoloration of both ankles with minimal erythema, significant only improved from recent hospitalization  EKG:  Normal sinus rhythm with left axis deviation, poor R-wave progression and lateral T-wave inversions. No significant change from prior tracing on 08/09/16.  Lab Results  Component Value Date   WBC 7.6 08/09/2016   HGB 13.7 08/09/2016   HCT 41.4 08/09/2016   MCV 86.5 08/09/2016   PLT 321 08/09/2016    Lab Results  Component Value Date   NA 136 08/09/2016   K 4.1 08/09/2016   CL 93 (L) 08/09/2016   CO2 33 (H) 08/09/2016   BUN 33 (H) 08/09/2016   CREATININE 1.28 (H) 08/09/2016   GLUCOSE 155 (H) 08/09/2016   ALT 23 04/04/2014    Lab Results  Component Value Date   CHOL 171 04/04/2014  HDL 31 (L) 04/04/2014   LDLCALC 70 04/04/2014   TRIG 351 (H) 04/04/2014    --------------------------------------------------------------------------------------------------  ASSESSMENT AND PLAN: Atrial fibrillation/flutter: The patient underwent successful cardioversion earlier this week. She remains in sinus rhythm with a heart rate in the mid 60s today. She is also on chronic anticoagulation with apixaban. Given her fatigue, we have agreed to come metoprolol in half to 25 mg daily. We will continue current doses of diltiazem and apixaban.  Heart failure with preserved ejection fraction: Patient noted to have diastolic dysfunction with moderate LVH on echo earlier this year. She appears euvolemic on exam today but has notable fatigue. I suspect this is likely multifactorial, including deconditioning from recent hospitalization as well as possible medication side effects. We have agreed to decrease metoprolol as above. EKG today demonstrates poor R-wave progression and lateral T-wave inversions, which have been  present in the past. However, if her fatigue does not improve, she may benefit from myocardial perfusion stress test.  Chest congestion and dysphagia: It is difficult to determine if the patient's symptoms are primarily related to cough with inability to bring up sputum versus difficulty swallowing. Given her recent respiratory infection, I suggested that she try guaifenesin to see if this helps. She should continue using the inhaler that was prescribed at the time of her hospitalization. Her symptoms do not improve, she may benefit from GI evaluation to look for esophageal pathology. Atypical angina is felt less likely, though given her EKG findings, stress test may be beneficial if no other etiology for her symptoms is found.  Follow-up: Return to clinic as previously scheduled with Dr. Rockey Situ on 09/24/16.  Nelva Bush, MD 08/12/2016 9:27 PM

## 2016-08-11 NOTE — Patient Instructions (Addendum)
Medication Instructions:  Your physician has recommended you make the following change in your medication:  1- DECREASE Metoprolol tartrate to 25 mg by mouth twice a day.  You may get Guaifenesin over the counter for your cough. If that does not seem to help then please follow up with your Primary Care Physician next week.    Follow-Up: Your physician recommends that you keep your follow-up appointment with DR Rockey Situ ON 09/14/16 AT 11:40 AM.   If you need a refill on your cardiac medications before your next appointment, please call your pharmacy.

## 2016-08-12 ENCOUNTER — Telehealth: Payer: Self-pay | Admitting: Cardiovascular Disease

## 2016-08-12 NOTE — Telephone Encounter (Signed)
Spoke w/ pt. Advised her that Dr. Rockey Situ cannot prescribe pain meds. She has an appt w/ her PCP tomorrow to discuss. Asked her to call back w/ any other questions or concerns.

## 2016-08-12 NOTE — Telephone Encounter (Signed)
Pt states Dr. Rockey Situ gave her pain medication in the hospital, and she needs a refill. She also asks if Dr. Rockey Situ still wants her to take pain medication. She states the pain is from her back, not her heart.

## 2016-08-13 ENCOUNTER — Encounter: Payer: Self-pay | Admitting: Internal Medicine

## 2016-08-25 ENCOUNTER — Telehealth: Payer: Self-pay | Admitting: Family

## 2016-08-25 ENCOUNTER — Ambulatory Visit: Payer: Medicare Other | Admitting: Family

## 2016-08-25 NOTE — Telephone Encounter (Signed)
Patient missed her initial appointment at the [facility] on 08/25/16. Will attempt to reschedule.

## 2016-09-03 ENCOUNTER — Encounter: Payer: Self-pay | Admitting: Cardiovascular Disease

## 2016-09-24 ENCOUNTER — Encounter: Payer: Self-pay | Admitting: Cardiovascular Disease

## 2016-09-24 ENCOUNTER — Ambulatory Visit (INDEPENDENT_AMBULATORY_CARE_PROVIDER_SITE_OTHER): Payer: Medicare Other | Admitting: Cardiovascular Disease

## 2016-09-24 VITALS — BP 110/80 | HR 74 | Ht 60.0 in | Wt 169.8 lb

## 2016-09-24 DIAGNOSIS — I483 Typical atrial flutter: Secondary | ICD-10-CM | POA: Diagnosis not present

## 2016-09-24 DIAGNOSIS — R600 Localized edema: Secondary | ICD-10-CM

## 2016-09-24 DIAGNOSIS — I5030 Unspecified diastolic (congestive) heart failure: Secondary | ICD-10-CM | POA: Diagnosis not present

## 2016-09-24 DIAGNOSIS — I10 Essential (primary) hypertension: Secondary | ICD-10-CM

## 2016-09-24 NOTE — Progress Notes (Addendum)
Cardiology Office Note  Date:  09/24/2016   ID:  Megan Cisneros, DOB 1956/05/16, MRN [id number]  PCP:  Donnie Coffin, MD   Chief Complaint  Patient presents with  . other    Follow up from [facility]; pt. had a cardioversion in Dec. 2017.  Meds reviewed by the pt. verbally.     HPI:  61 y/o woman with history of DM and HTN, two TIA's and a stroke (2007 - [place]), s/p peripheral arterial bypass on the RLE in [place], [place] in 2004, chronic RLE edema, chronic DOE who presents for follow-up of her atrial flutter  Atrial flutter April 2017 , she did not want cardioversion when seen in May 2017, remained in atrial flutter when seen July 2017 stress testing prior to her LE bypass in 2004 reportedly nl. History of chronic lower extremity edema  In the hospital 07/2016, atrial flutter with rapid ventricular response- acute on chronic diastolic CHF exacerbation-  diuresed with improvement in her leg swelling.  Rate control proved difficult, and the patient ultimately underwent DCCV with maintenance of sinus rhythm.  She remained on apixaban. Echo: diastolic dysfunction with moderate LVH  CHF education provided, started on Lasix 40 mg twice a day, instructed to moderate her fluid intake  Today with severe pain left large toe Gout? Patient concerned Sees Dr. Clide Deutscher, health dept, Juanda Crumble drew, appointment not for several weeks   No leg swelling on diltiazem, edema has resolved  Reports having overactive bladder Legs are weak, needs help getting up Unable to do PT per the patient, "it involves walking"  Dramatic weight loss in past few months, food does not move, GI issues Today 169 Last visit 07/2016: 183 Visit 02/2016: weight 210 She is drinking much less  Lab work reviewed with her showing hemoglobin A1c 7.1, Tolerating anticoagulation  EKG on today's visit shows normal sinus rhythm with rate 73 bpm, poor R-wave progression through the anterior precordial leads, left axis  deviation  Other past medical history she had worsened DOE, family brought her into [facility] ED on the afternoon of 12/11/15 noted to be in atrial flutter, which was rate-controlled in the 80's.  eliquis 5 bid given CHA2DS2VASc of 7.  d/c'd on 4/15 @ a wt of 215 lbs.   PMH:   has a past medical history of Atrial flutter ([place]); Breast cancer ([place]); Chronic diastolic CHF (congestive heart failure) ([place]); Chronic Dyspnea on Exertion; Diabetes mellitus without complication ([place]); Essential hypertension; Lower extremity edema; Morbid obesity ([place]); PVC's (premature ventricular contractions); PVD (peripheral vascular disease) ([place]); and Stroke ([place]).  PSH:    Past Surgical History:  Procedure Laterality Date  . Cataract Surgery Right    03/2014  . CESAREAN SECTION    . ELECTROPHYSIOLOGIC STUDY N/A 08/09/2016   Procedure: CARDIOVERSION;  Surgeon: Wellington Hampshire, MD;  Location: [facility] ORS;  Service: Cardiovascular;  Laterality: N/A;  . LEG SURGERY    . MASTECTOMY Left   . right leg bypass Right     Current Outpatient Prescriptions  Medication Sig Dispense Refill  . apixaban (ELIQUIS) 5 MG TABS tablet Take 1 tablet (5 mg total) by mouth 2 (two) times daily. 60 tablet 3  . atorvastatin (LIPITOR) 80 MG tablet Take 80 mg by mouth daily at 6 PM.     . diltiazem (CARDIZEM CD) 120 MG 24 hr capsule Take 1 capsule (120 mg total) by mouth daily. 30 capsule 0  . furosemide (LASIX) 40 MG tablet Take 1 tablet (40 mg total) by  mouth 2 (two) times daily.  180 tablet 3  . gabapentin (NEURONTIN) 100 MG capsule Take 1 capsule (100 mg total) by mouth daily. 30 capsule 0  . insulin detemir (LEVEMIR) 100 UNIT/ML injection Inject 0.05 mLs (5 Units total) into the skin at bedtime. (Patient taking differently: Inject 10 Units into the skin at bedtime. ) 10 mL 11  . lisinopril (PRINIVIL,ZESTRIL) 10 MG tablet Take 1 tablet (10 mg total) by mouth daily. 30 tablet 0  . metFORMIN (GLUCOPHAGE) 500 MG tablet Take 1,000  mg by mouth 2 (two) times daily.     . metoprolol tartrate (LOPRESSOR) 25 MG tablet Take 1 tablet (25 mg total) by mouth 2 (two) times daily. 180 tablet 2  . Multiple Vitamin (MULTIVITAMIN WITH MINERALS) TABS tablet Take 1 tablet by mouth daily.    Marland Kitchen omeprazole (PRILOSEC) 20 MG capsule Take 20 mg by mouth daily.    Marland Kitchen oxybutynin (DITROPAN) 5 MG tablet Take 5 mg by mouth 2 (two) times daily.    . traZODone (DESYREL) 100 MG tablet Take 100 mg by mouth at bedtime.    . VENTOLIN HFA 108 (90 Base) MCG/ACT inhaler Inhale 2 puffs into the lungs every 6 (six) hours as needed.      No current facility-administered medications for this visit.      Allergies:   Patient has no known allergies.   Social History:  The patient  reports that she has quit smoking. She has quit using smokeless tobacco. She reports that she does not drink alcohol or use drugs.   Family History:   family history includes Cancer in her brother and mother; Colon cancer in her mother; Diabetes in her father; Heart attack in her father; Leukemia in her brother; Liver cancer in her brother; Pancreatic cancer in her brother.    Review of Systems: Review of Systems  Constitutional: Negative.   Respiratory: Negative.   Cardiovascular: Positive for leg swelling.  Gastrointestinal: Negative.   Musculoskeletal: Negative.        Left large toe pain  Neurological: Negative.        Neuropathy lower extremities  Psychiatric/Behavioral: Negative.   All other systems reviewed and are negative.    PHYSICAL EXAM: VS:  BP 110/80 (BP Location: Left Arm, Patient Position: Sitting, Cuff Size: Normal)   Pulse 74   Ht 5' (1.524 m)   Wt 169 lb 12 oz (77 kg)   BMI 33.15 kg/m  , BMI Body mass index is 33.15 kg/m. GEN: Well nourished, well developed, in no acute distress  HEENT: normal  Neck: no JVD, carotid bruits, or masses Cardiac: RRR; no murmurs, rubs, or gallops,no edema  Respiratory:  clear to auscultation bilaterally, normal  work of breathing GI: soft, nontender, nondistended, + BS MS: no deformity or atrophy  Skin: warm and dry, no rash, left foot large toe appears very red, swollen, tender Neuro:  Strength and sensation are intact Psych: euthymic mood, full affect    Recent Labs: 08/05/2016: B Natriuretic Peptide 355.0; TSH 3.136 08/09/2016: BUN 33; Creatinine, Ser 1.28; Hemoglobin 13.7; Magnesium 2.1; Platelets 321; Potassium 4.1; Sodium 136    Lipid Panel Lab Results  Component Value Date   CHOL 171 04/04/2014   HDL 31 (L) 04/04/2014   LDLCALC 70 04/04/2014   TRIG 351 (H) 04/04/2014      Wt Readings from Last 3 Encounters:  09/24/16 169 lb 12 oz (77 kg)  08/11/16 183 lb 8 oz (83.2 kg)  08/09/16 180 lb (81.6  kg)       ASSESSMENT AND PLAN:  Heart failure with preserved left ventricular function (HFpEF) ([place]) - Plan: EKG 12-Lead Dramatic drop in her weight over the past month or so Leg edema has resolved 15 pound weight drop in the past month Recommended she decrease Lasix down to 40 mg daily down from twice a day dosing, Labwork through primary care , BMP . She is scheduled to see them tomorrow for gout   Essential hypertension - Plan: EKG 12-Lead Blood pressure is well controlled on today's visit. No changes made to the medications.  Bilateral leg edema  Leg edema has essentially resolved  she has moderated her fluid intake, salt intake  Maintaining normal sinus rhythm after cardioversion   we will decrease Lasix down to 40 mg daily Extra Lasix in the afternoon for leg swelling or weight gain  Typical atrial flutter ([place]) - Plan: EKG 12-Lead  recommended she stay on anticoagulation, continue diltiazem, metoprolol   Morbid obesity due to excess calories ([facility])  she has had weight loss   unable to exercise given chronic leg pain, neuropathy    Gout Swollen toe concerning for gout, very painful  left foot large toe Scheduled to see primary care tomorrow   Total encounter time  more than 25 minutes  Greater than 50% was spent in counseling and coordination of care with the patient In  Disposition:   F/U  6 months   Orders Placed This Encounter  Procedures  . EKG 12-Lead     Signed, Esmond Plants, M.D., Ph.D. 09/24/2016  [place], [place]

## 2016-09-24 NOTE — Patient Instructions (Addendum)
Dr. Clide Deutscher will see you tomorrow @ 9:00   Medication Instructions:   Please decrease the lasix down to once a day Take extra lasix after lunch for leg swelling  Labwork:  No new labs needed  Testing/Procedures:  No further testing at this time   I recommend watching educational videos on topics of interest to you at:       [email or web address]  Enter code: HEARTCARE    Follow-Up: It was a pleasure seeing you in the office today. Please call us if you have new issues that need to be addressed before your next appt.  [phone]  Your physician wants you to follow-up in: 6 months.  You will receive a reminder letter in the mail two months in advance. If you don't receive a letter, please call our office to schedule the follow-up appointment.  If you need a refill on your cardiac medications before your next appointment, please call your pharmacy.

## 2016-10-01 ENCOUNTER — Emergency Department: Payer: Medicare Other

## 2016-10-01 ENCOUNTER — Inpatient Hospital Stay
Admission: EM | Admit: 2016-10-01 | Discharge: 2016-10-10 | DRG: 180 | Disposition: A | Discharge status: Home / Self Care | Payer: Medicare Other | Attending: Internal Medicine | Admitting: Internal Medicine

## 2016-10-01 ENCOUNTER — Encounter: Payer: Self-pay | Admitting: Emergency Medicine

## 2016-10-01 DIAGNOSIS — I871 Compression of vein: Secondary | ICD-10-CM | POA: Diagnosis not present

## 2016-10-01 DIAGNOSIS — Z7901 Long term (current) use of anticoagulants: Secondary | ICD-10-CM

## 2016-10-01 DIAGNOSIS — Z87891 Personal history of nicotine dependence: Secondary | ICD-10-CM

## 2016-10-01 DIAGNOSIS — R599 Enlarged lymph nodes, unspecified: Secondary | ICD-10-CM | POA: Diagnosis not present

## 2016-10-01 DIAGNOSIS — N183 Chronic kidney disease, stage 3 (moderate): Secondary | ICD-10-CM | POA: Diagnosis present

## 2016-10-01 DIAGNOSIS — Z853 Personal history of malignant neoplasm of breast: Secondary | ICD-10-CM | POA: Diagnosis not present

## 2016-10-01 DIAGNOSIS — B9561 Methicillin susceptible Staphylococcus aureus infection as the cause of diseases classified elsewhere: Secondary | ICD-10-CM | POA: Diagnosis present

## 2016-10-01 DIAGNOSIS — E1151 Type 2 diabetes mellitus with diabetic peripheral angiopathy without gangrene: Secondary | ICD-10-CM | POA: Diagnosis present

## 2016-10-01 DIAGNOSIS — K5900 Constipation, unspecified: Secondary | ICD-10-CM | POA: Diagnosis not present

## 2016-10-01 DIAGNOSIS — R109 Unspecified abdominal pain: Secondary | ICD-10-CM | POA: Diagnosis not present

## 2016-10-01 DIAGNOSIS — L89152 Pressure ulcer of sacral region, stage 2: Secondary | ICD-10-CM | POA: Diagnosis present

## 2016-10-01 DIAGNOSIS — M4856XA Collapsed vertebra, not elsewhere classified, lumbar region, initial encounter for fracture: Secondary | ICD-10-CM | POA: Diagnosis present

## 2016-10-01 DIAGNOSIS — C3490 Malignant neoplasm of unspecified part of unspecified bronchus or lung: Secondary | ICD-10-CM | POA: Diagnosis not present

## 2016-10-01 DIAGNOSIS — R0902 Hypoxemia: Secondary | ICD-10-CM

## 2016-10-01 DIAGNOSIS — Z66 Do not resuscitate: Secondary | ICD-10-CM | POA: Diagnosis not present

## 2016-10-01 DIAGNOSIS — Z6836 Body mass index (BMI) 36.0-36.9, adult: Secondary | ICD-10-CM | POA: Diagnosis not present

## 2016-10-01 DIAGNOSIS — R451 Restlessness and agitation: Secondary | ICD-10-CM | POA: Diagnosis not present

## 2016-10-01 DIAGNOSIS — I130 Hypertensive heart and chronic kidney disease with heart failure and stage 1 through stage 4 chronic kidney disease, or unspecified chronic kidney disease: Secondary | ICD-10-CM | POA: Diagnosis present

## 2016-10-01 DIAGNOSIS — N2889 Other specified disorders of kidney and ureter: Secondary | ICD-10-CM | POA: Diagnosis not present

## 2016-10-01 DIAGNOSIS — I4892 Unspecified atrial flutter: Secondary | ICD-10-CM | POA: Diagnosis present

## 2016-10-01 DIAGNOSIS — I959 Hypotension, unspecified: Secondary | ICD-10-CM | POA: Diagnosis present

## 2016-10-01 DIAGNOSIS — I5032 Chronic diastolic (congestive) heart failure: Secondary | ICD-10-CM | POA: Diagnosis present

## 2016-10-01 DIAGNOSIS — E860 Dehydration: Secondary | ICD-10-CM | POA: Diagnosis present

## 2016-10-01 DIAGNOSIS — N390 Urinary tract infection, site not specified: Secondary | ICD-10-CM | POA: Diagnosis present

## 2016-10-01 DIAGNOSIS — I9589 Other hypotension: Secondary | ICD-10-CM | POA: Diagnosis not present

## 2016-10-01 DIAGNOSIS — M4856XS Collapsed vertebra, not elsewhere classified, lumbar region, sequela of fracture: Secondary | ICD-10-CM | POA: Diagnosis not present

## 2016-10-01 DIAGNOSIS — Z515 Encounter for palliative care: Secondary | ICD-10-CM

## 2016-10-01 DIAGNOSIS — Z8673 Personal history of transient ischemic attack (TIA), and cerebral infarction without residual deficits: Secondary | ICD-10-CM

## 2016-10-01 DIAGNOSIS — K769 Liver disease, unspecified: Secondary | ICD-10-CM | POA: Diagnosis not present

## 2016-10-01 DIAGNOSIS — N170 Acute kidney failure with tubular necrosis: Secondary | ICD-10-CM

## 2016-10-01 DIAGNOSIS — E1122 Type 2 diabetes mellitus with diabetic chronic kidney disease: Secondary | ICD-10-CM | POA: Diagnosis present

## 2016-10-01 DIAGNOSIS — Z79899 Other long term (current) drug therapy: Secondary | ICD-10-CM

## 2016-10-01 DIAGNOSIS — C3401 Malignant neoplasm of right main bronchus: Secondary | ICD-10-CM | POA: Diagnosis present

## 2016-10-01 DIAGNOSIS — R531 Weakness: Secondary | ICD-10-CM

## 2016-10-01 DIAGNOSIS — Z7189 Other specified counseling: Secondary | ICD-10-CM

## 2016-10-01 DIAGNOSIS — I4891 Unspecified atrial fibrillation: Secondary | ICD-10-CM

## 2016-10-01 DIAGNOSIS — R319 Hematuria, unspecified: Secondary | ICD-10-CM | POA: Diagnosis present

## 2016-10-01 DIAGNOSIS — E440 Moderate protein-calorie malnutrition: Secondary | ICD-10-CM | POA: Diagnosis present

## 2016-10-01 DIAGNOSIS — C3491 Malignant neoplasm of unspecified part of right bronchus or lung: Secondary | ICD-10-CM | POA: Diagnosis not present

## 2016-10-01 DIAGNOSIS — C787 Secondary malignant neoplasm of liver and intrahepatic bile duct: Secondary | ICD-10-CM | POA: Diagnosis present

## 2016-10-01 DIAGNOSIS — C801 Malignant (primary) neoplasm, unspecified: Secondary | ICD-10-CM

## 2016-10-01 DIAGNOSIS — R05 Cough: Secondary | ICD-10-CM

## 2016-10-01 DIAGNOSIS — S32040A Wedge compression fracture of fourth lumbar vertebra, initial encounter for closed fracture: Secondary | ICD-10-CM

## 2016-10-01 DIAGNOSIS — C7951 Secondary malignant neoplasm of bone: Secondary | ICD-10-CM | POA: Diagnosis present

## 2016-10-01 DIAGNOSIS — D649 Anemia, unspecified: Secondary | ICD-10-CM | POA: Diagnosis not present

## 2016-10-01 DIAGNOSIS — M545 Low back pain: Secondary | ICD-10-CM | POA: Diagnosis not present

## 2016-10-01 DIAGNOSIS — C799 Secondary malignant neoplasm of unspecified site: Secondary | ICD-10-CM

## 2016-10-01 DIAGNOSIS — Z800 Family history of malignant neoplasm of digestive organs: Secondary | ICD-10-CM

## 2016-10-01 DIAGNOSIS — Z9012 Acquired absence of left breast and nipple: Secondary | ICD-10-CM

## 2016-10-01 DIAGNOSIS — N179 Acute kidney failure, unspecified: Secondary | ICD-10-CM | POA: Diagnosis not present

## 2016-10-01 DIAGNOSIS — R059 Cough, unspecified: Secondary | ICD-10-CM

## 2016-10-01 DIAGNOSIS — Z794 Long term (current) use of insulin: Secondary | ICD-10-CM

## 2016-10-01 DIAGNOSIS — L8990 Pressure ulcer of unspecified site, unspecified stage: Secondary | ICD-10-CM | POA: Insufficient documentation

## 2016-10-01 LAB — URINALYSIS, COMPLETE (UACMP) WITH MICROSCOPIC
Bilirubin Urine: NEGATIVE
Glucose, UA: >=500 mg/dL — AB
Ketones, ur: NEGATIVE mg/dL
Nitrite: NEGATIVE
PH: 6 (ref 5.0–8.0)
Protein, ur: 100 mg/dL — AB
SPECIFIC GRAVITY, URINE: 1.017 (ref 1.005–1.030)
Squamous Epithelial / LPF: NONE SEEN

## 2016-10-01 LAB — BASIC METABOLIC PANEL
Anion gap: 13 (ref 5–15)
BUN: 38 mg/dL — ABNORMAL HIGH (ref 6–20)
CALCIUM: 9 mg/dL (ref 8.9–10.3)
CO2: 31 mmol/L (ref 22–32)
CREATININE: 1.14 mg/dL — AB (ref 0.44–1.00)
Chloride: 91 mmol/L — ABNORMAL LOW (ref 101–111)
GFR calc Af Amer: 58 mL/min — ABNORMAL LOW (ref >60)
GFR calc non Af Amer: 50 mL/min — ABNORMAL LOW (ref >60)
Glucose, Bld: 385 mg/dL — ABNORMAL HIGH (ref 65–99)
Potassium: 4 mmol/L (ref 3.5–5.1)
SODIUM: 135 mmol/L (ref 135–145)

## 2016-10-01 LAB — CBC
HCT: 38.5 % (ref 35.0–47.0)
Hemoglobin: 13.1 g/dL (ref 12.0–16.0)
MCH: 29.3 pg (ref 26.0–34.0)
MCHC: 34 g/dL (ref 32.0–36.0)
MCV: 86.1 fL (ref 80.0–100.0)
PLATELETS: 337 10*3/uL (ref 150–440)
RBC: 4.47 MIL/uL (ref 3.80–5.20)
RDW: 18.7 % — ABNORMAL HIGH (ref 11.5–14.5)
WBC: 12.2 10*3/uL — ABNORMAL HIGH (ref 3.6–11.0)

## 2016-10-01 LAB — TROPONIN I: Troponin I: 0.04 ng/mL (ref <0.03)

## 2016-10-01 MED ORDER — CEFTRIAXONE SODIUM-DEXTROSE 1-3.74 GM-% IV SOLR
1.0000 g | INTRAVENOUS | Status: DC
  Administered 2016-10-02 – 2016-10-03 (×2): 1 g via INTRAVENOUS
  Filled 2016-10-01 (×2): qty 50

## 2016-10-01 MED ORDER — ALBUTEROL SULFATE (2.5 MG/3ML) 0.083% IN NEBU
2.5000 mg | INHALATION_SOLUTION | RESPIRATORY_TRACT | Status: DC
  Administered 2016-10-01: 23:00:00 2.5 mg via RESPIRATORY_TRACT
  Filled 2016-10-01: qty 3

## 2016-10-01 MED ORDER — CEFTRIAXONE SODIUM-DEXTROSE 1-3.74 GM-% IV SOLR
1.0000 g | Freq: Once | INTRAVENOUS | Status: AC
  Administered 2016-10-01: 1 g via INTRAVENOUS

## 2016-10-01 MED ORDER — ACETAMINOPHEN 325 MG PO TABS: 650.0000 mg | ORAL_TABLET | Freq: Four times a day (QID) | ORAL | Status: DC | PRN

## 2016-10-01 MED ORDER — ONDANSETRON HCL 4 MG PO TABS: 4.0000 mg | ORAL_TABLET | Freq: Four times a day (QID) | ORAL | Status: DC | PRN

## 2016-10-01 MED ORDER — FUROSEMIDE 40 MG PO TABS
40.0000 mg | ORAL_TABLET | Freq: Two times a day (BID) | ORAL | Status: DC
  Administered 2016-10-02 – 2016-10-04 (×6): 40 mg via ORAL
  Filled 2016-10-01 (×7): qty 1

## 2016-10-01 MED ORDER — DOCUSATE SODIUM 100 MG PO CAPS
100.0000 mg | ORAL_CAPSULE | Freq: Two times a day (BID) | ORAL | Status: DC
  Administered 2016-10-01 – 2016-10-09 (×15): 100 mg via ORAL
  Filled 2016-10-01 (×15): qty 1

## 2016-10-01 MED ORDER — PANTOPRAZOLE SODIUM 40 MG PO TBEC
40.0000 mg | DELAYED_RELEASE_TABLET | Freq: Every day | ORAL | Status: DC
  Administered 2016-10-02 – 2016-10-09 (×8): 40 mg via ORAL
  Filled 2016-10-01 (×8): qty 1

## 2016-10-01 MED ORDER — OXYBUTYNIN CHLORIDE 5 MG PO TABS
5.0000 mg | ORAL_TABLET | Freq: Two times a day (BID) | ORAL | Status: DC
  Administered 2016-10-01 – 2016-10-09 (×16): 5 mg via ORAL
  Filled 2016-10-01 (×17): qty 1

## 2016-10-01 MED ORDER — HYDROCODONE-ACETAMINOPHEN 5-325 MG PO TABS
1.0000 | ORAL_TABLET | ORAL | Status: DC | PRN
  Administered 2016-10-04: 2 via ORAL
  Administered 2016-10-05: 06:00:00 1 via ORAL
  Administered 2016-10-07 – 2016-10-08 (×2): 2 via ORAL
  Administered 2016-10-09 – 2016-10-10 (×2): 1 via ORAL
  Filled 2016-10-01: qty 2
  Filled 2016-10-01: qty 1
  Filled 2016-10-01 (×2): qty 2
  Filled 2016-10-01 (×2): qty 1

## 2016-10-01 MED ORDER — BISACODYL 5 MG PO TBEC
5.0000 mg | DELAYED_RELEASE_TABLET | Freq: Every day | ORAL | Status: DC | PRN
  Administered 2016-10-08 – 2016-10-09 (×2): 5 mg via ORAL
  Filled 2016-10-01 (×2): qty 1

## 2016-10-01 MED ORDER — GABAPENTIN 100 MG PO CAPS
100.0000 mg | ORAL_CAPSULE | Freq: Two times a day (BID) | ORAL | Status: DC
  Administered 2016-10-01 – 2016-10-09 (×17): 100 mg via ORAL
  Filled 2016-10-01 (×17): qty 1

## 2016-10-01 MED ORDER — INSULIN DETEMIR 100 UNIT/ML ~~LOC~~ SOLN
5.0000 [IU] | Freq: Every day | SUBCUTANEOUS | Status: DC
  Administered 2016-10-02 – 2016-10-08 (×4): 5 [IU] via SUBCUTANEOUS
  Filled 2016-10-01 (×8): qty 0.05

## 2016-10-01 MED ORDER — DEXTROSE 5 % IV SOLN: 1.0000 g | INTRAVENOUS | Status: DC

## 2016-10-01 MED ORDER — HEPARIN SODIUM (PORCINE) 5000 UNIT/ML IJ SOLN
5000.0000 [IU] | Freq: Three times a day (TID) | INTRAMUSCULAR | Status: DC
  Administered 2016-10-01 – 2016-10-05 (×9): 5000 [IU] via SUBCUTANEOUS
  Filled 2016-10-01 (×10): qty 1

## 2016-10-01 MED ORDER — LISINOPRIL 10 MG PO TABS
10.0000 mg | ORAL_TABLET | Freq: Every day | ORAL | Status: DC
  Administered 2016-10-02 – 2016-10-04 (×2): 10 mg via ORAL
  Filled 2016-10-01 (×3): qty 1

## 2016-10-01 MED ORDER — CEFTRIAXONE SODIUM-DEXTROSE 1-3.74 GM-% IV SOLR
INTRAVENOUS | Status: AC
  Administered 2016-10-01: 1 g via INTRAVENOUS
  Filled 2016-10-01: qty 50

## 2016-10-01 MED ORDER — ALBUTEROL SULFATE (2.5 MG/3ML) 0.083% IN NEBU
2.5000 mg | INHALATION_SOLUTION | RESPIRATORY_TRACT | Status: DC | PRN
  Administered 2016-10-07 – 2016-10-09 (×2): 2.5 mg via RESPIRATORY_TRACT
  Filled 2016-10-01 (×2): qty 3

## 2016-10-01 MED ORDER — TRAZODONE HCL 100 MG PO TABS
100.0000 mg | ORAL_TABLET | Freq: Two times a day (BID) | ORAL | Status: DC
  Administered 2016-10-01 – 2016-10-09 (×15): 100 mg via ORAL
  Filled 2016-10-01 (×4): qty 1
  Filled 2016-10-01: qty 2
  Filled 2016-10-01: qty 1
  Filled 2016-10-01: qty 2
  Filled 2016-10-01 (×5): qty 1
  Filled 2016-10-01: qty 2
  Filled 2016-10-01 (×2): qty 1

## 2016-10-01 MED ORDER — ATORVASTATIN CALCIUM 20 MG PO TABS
80.0000 mg | ORAL_TABLET | Freq: Every day | ORAL | Status: DC
  Administered 2016-10-02 – 2016-10-08 (×6): 80 mg via ORAL
  Filled 2016-10-01 (×8): qty 4

## 2016-10-01 MED ORDER — ADULT MULTIVITAMIN W/MINERALS CH
1.0000 | ORAL_TABLET | Freq: Every day | ORAL | Status: DC
  Administered 2016-10-02 – 2016-10-09 (×8): 1 via ORAL
  Filled 2016-10-01 (×8): qty 1

## 2016-10-01 MED ORDER — METOPROLOL TARTRATE 25 MG PO TABS
25.0000 mg | ORAL_TABLET | Freq: Two times a day (BID) | ORAL | Status: DC
  Administered 2016-10-01 – 2016-10-05 (×8): 25 mg via ORAL
  Filled 2016-10-01 (×10): qty 1

## 2016-10-01 MED ORDER — ONDANSETRON HCL 4 MG/2ML IJ SOLN
4.0000 mg | Freq: Four times a day (QID) | INTRAMUSCULAR | Status: DC | PRN
  Administered 2016-10-02 (×2): 4 mg via INTRAVENOUS
  Filled 2016-10-01 (×2): qty 2

## 2016-10-01 MED ORDER — SODIUM CHLORIDE 0.9 % IV SOLN
INTRAVENOUS | Status: DC
  Administered 2016-10-01 – 2016-10-02 (×2): via INTRAVENOUS

## 2016-10-01 MED ORDER — ACETAMINOPHEN 650 MG RE SUPP: 650.0000 mg | Freq: Four times a day (QID) | RECTAL | Status: DC | PRN

## 2016-10-01 MED ORDER — METFORMIN HCL 500 MG PO TABS
1000.0000 mg | ORAL_TABLET | Freq: Two times a day (BID) | ORAL | Status: DC
  Administered 2016-10-02 – 2016-10-06 (×6): 1000 mg via ORAL
  Filled 2016-10-01 (×7): qty 2

## 2016-10-01 MED ORDER — TRAZODONE HCL 50 MG PO TABS: 25.0000 mg | ORAL_TABLET | Freq: Every evening | ORAL | Status: DC | PRN

## 2016-10-01 MED ORDER — GABAPENTIN 100 MG PO CAPS: 100.0000 mg | ORAL_CAPSULE | Freq: Every day | ORAL | Status: DC

## 2016-10-01 MED ORDER — DEXTROSE 5 % IV SOLN: 1.0000 g | Freq: Once | INTRAVENOUS | Status: DC

## 2016-10-01 MED ORDER — DILTIAZEM HCL ER COATED BEADS 120 MG PO CP24
120.0000 mg | ORAL_CAPSULE | Freq: Every day | ORAL | Status: DC
  Administered 2016-10-02 – 2016-10-04 (×3): 120 mg via ORAL
  Filled 2016-10-01 (×3): qty 1

## 2016-10-01 NOTE — ED Provider Notes (Signed)
[facility] Emergency Department Provider Note        Time seen: ----------------------------------------- 4:37 PM on 10/01/2016 -----------------------------------------    I have reviewed the triage vital signs and the nursing notes.   HISTORY  Chief Complaint Weakness    HPI Megan Cisneros is a 61 y.o. female presents to the ER for weakness. Patient reports just that she felt weak and she knows when she drank water he would run down her chin. Patient states today she is having back pain is worse than normal and states her legs are weak and its harder for her to walk.Patient is concerned she may have had a stroke because she her legs feel more weak than normal. Currently she has home physical therapy but she does not feel like she's getting any stronger.   Past Medical History:  Diagnosis Date  . Atrial flutter ([place])    a. Dx 11/2015 -->CHA2DS2VASc = 7-->Eliquis '5mg'$  BID.  Marland Kitchen Breast cancer ([place])   . Chronic diastolic CHF (congestive heart failure) ([place])    a. 11/2015 Echo: EF 60-65%, no rwma, mild MR, mildly dil LA, nl RV fxn.  . Chronic Dyspnea on Exertion    a. 2004 Stress test prior to LE bypass->reportedly nl.  . Diabetes mellitus without complication ([place])    a. Dx as borderline diabetic in the 80's-->progressed over the years.  . Essential hypertension   . Lower extremity edema   . Morbid obesity ([place])   . PVC's (premature ventricular contractions)   . PVD (peripheral vascular disease) ([place])    a. 2004 s/p RLE bypass - complicated by recurrent infection.  . Stroke [facility])    a. TIA x 2;  b. Stroke in 2007 in [place].    Patient Active Problem List   Diagnosis Date Noted  . UTI (urinary tract infection) 08/06/2016  . Tinea corporis 08/06/2016  . Muscle weakness (generalized)   . Notalgia   . Hypokalemia 08/05/2016  . Hypomagnesemia 08/05/2016  . Cellulitis of left lower extremity   . Chest pain   . Essential hypertension 01/19/2016  .  Acute on chronic diastolic CHF (congestive heart failure) ([place])   . Shortness of breath   . Bilateral leg edema   . SOB (shortness of breath)   . Morbid obesity due to excess calories ([place])   . CHF (congestive heart failure) ([place]) 12/11/2015    Past Surgical History:  Procedure Laterality Date  . Cataract Surgery Right    03/2014  . CESAREAN SECTION    . ELECTROPHYSIOLOGIC STUDY N/A 08/09/2016   Procedure: CARDIOVERSION;  Surgeon: Wellington Hampshire, MD;  Location: [facility] ORS;  Service: Cardiovascular;  Laterality: N/A;  . LEG SURGERY    . MASTECTOMY Left   . right leg bypass Right     Allergies Patient has no known allergies.  Social History Social History  Substance Use Topics  . Smoking status: Former Research scientist (life sciences)  . Smokeless tobacco: Former Systems developer  . Alcohol use No    Review of Systems Constitutional: Negative for fever. Cardiovascular: Negative for chest pain. Respiratory: Negative for shortness of breath. Gastrointestinal: Negative for abdominal pain, vomiting and diarrhea. Genitourinary: Negative for dysuria. Musculoskeletal: Negative for back pain. Skin: Negative for rash. Neurological: Negative for headaches,Positive for weakness  10-point ROS otherwise negative.  ____________________________________________   PHYSICAL EXAM:  VITAL SIGNS: ED Triage Vitals  Enc Vitals Group     BP 10/01/16 1400 (!) 152/69     Pulse Rate 10/01/16 1400 73  Resp 10/01/16 1400 16     Temp 10/01/16 1400 97.6 F (36.4 C)     Temp Source 10/01/16 1400 Axillary     SpO2 10/01/16 1400 96 %     Weight 10/01/16 1402 169 lb (76.7 kg)     Height 10/01/16 1402 5' (1.524 m)     Head Circumference --      Peak Flow --      Pain Score 10/01/16 1403 8     Pain Loc --      Pain Edu? --      Excl. in [place]? --     Constitutional: Alert and oriented. Well appearing and in no distress. Eyes: Conjunctivae are normal. PERRL. Normal extraocular movements. ENT   Head: Normocephalic and  atraumatic.   Nose: No congestion/rhinnorhea.   Mouth/Throat: Mucous membranes are moist.   Neck: No stridor. Cardiovascular: Normal rate, regular rhythm. No murmurs, rubs, or gallops. Respiratory: Normal respiratory effort without tachypnea nor retractions. Breath sounds are clear and equal bilaterally. No wheezes/rales/rhonchi. Gastrointestinal: Soft and nontender. Normal bowel sounds Musculoskeletal: Nontender with normal range of motion in all extremities. Lower extremity weakness compared to the upper extremity. Weakness appears symmetric.  Neurologic:  Normal speech and language. No gross focal neurologic deficits are appreciated. There is symmetrical weakness in the lower extremities. Cranial nerves appear to be intact. Skin:  Skin is warm, dry and intact. No rash noted. Psychiatric: Mood and affect are normal. Speech and behavior are normal.  ____________________________________________  EKG: Interpreted by me. Sinus rhythm with PACs, rate of 73 bpm, normal PR interval, normal QRS, normal QT. T-wave inversions inferiorly and laterally  ____________________________________________  ED COURSE:  Pertinent labs & imaging results that were available during my care of the patient were reviewed by me and considered in my medical decision making (see chart for details). Patient's no distress, we will assess with labs and imaging. Clinical Course as of Oct 01 1820  Fri Oct 01, 2016  1725 Leukocytes, UA: (!) LARGE [JW]  1725 RBC / HPF: TOO NUMEROUS TO COUNT [JW]  1725 WBC, UA: TOO NUMEROUS TO COUNT [JW]  1725 Bacteria, UA: (!) MANY [JW]  1726 Patient with evidence of UTI, we will send a culture, check imaging of the kidneys and given IV antibiotics. Bacteria, UA: (!) MANY [JW]    Clinical Course User Index [JW] Earleen Newport, MD   Procedures ____________________________________________   LABS (pertinent positives/negatives)  Labs Reviewed  BASIC METABOLIC PANEL -  Abnormal; Notable for the following:       Result Value   Chloride 91 (*)    Glucose, Bld 385 (*)    BUN 38 (*)    Creatinine, Ser 1.14 (*)    GFR calc non Af Amer 50 (*)    GFR calc Af Amer 58 (*)    All other components within normal limits  CBC - Abnormal; Notable for the following:    WBC 12.2 (*)    RDW 18.7 (*)    All other components within normal limits  URINALYSIS, COMPLETE (UACMP) WITH MICROSCOPIC  TROPONIN I  CBG MONITORING, ED    RADIOLOGY Images were viewed by me  CT head/Chest x-ray/CT of the abdomen and pelvis IMPRESSION: Enlarging dense right hilar opacity is suspicious for malignancy. CT of the chest with contrast is recommended for further evaluation. IMPRESSION: 1. No acute intracranial pathology. 2. Chronic microvascular disease and cerebral atrophy. IMPRESSION: 1. Extensive metastatic disease in the liver and retroperitoneal lymph nodes.  Patient has history of breast cancer and there is a 4 cm left renal mass compatible with renal cell carcinoma. 2. Asymmetric left bladder wall thickening. 3. L4 superior endplate fracture, with nonacute appearance. This may be pathologic. ____________________________________________  FINAL ASSESSMENT AND PLAN  Weakness, metastatic cancer, UTI  Plan: Patient with labs and imaging as dictated above. Patient had presented with a vague complaint of weakness. She is found to have significant evidence of UTI. We have sent urine culture and given IV Rocephin. She is too weak to ambulate safely. She also has a newly diagnosed most metastatic cancer seen CT today. We will discuss with the hospitalist for admission, they will consult oncology.   Earleen Newport, MD   Note: This note was generated in part or whole with voice recognition software. Voice recognition is usually quite accurate but there are transcription errors that can and very often do occur. I apologize for any typographical errors that were not detected  and corrected.     Earleen Newport, MD 10/01/16 [phone]

## 2016-10-01 NOTE — H&P (Signed)
[place] [facility] [place] NAME: Megan Cisneros    MR#:  [id number]  DATE OF BIRTH:  [birth year]  DATE OF ADMISSION:  10/01/2016  PRIMARY CARE PHYSICIAN: Donnie Coffin, MD   REQUESTING/REFERRING PHYSICIAN: Esperanza Heir  CHIEF COMPLAINT: Generalized weakness    Chief Complaint  Patient presents with  . Weakness    HISTORY OF PRESENT ILLNESS:  Megan Cisneros  is a 61 y.o. female with a known history ofAtrial fibrillation, diabetes mellitus type 2, essential hypertension, morbid obesity, PVD, history of remote breast cancer 20 years ago comes in because of generalized weakness, history of fall today. Also notices rectal bleeding for the past 1 month without evidence of abdominal pain. Daughter mentioned that she lost weight of about 20 pounds in the last 2 months with decreased appetite. Patient has weakness in the legs and uses walker at baseline. Denies any tingling or numbness or incontinence problems. Noted to have possible lung malignancy in the left lung on x-ray of the chest, metastases to liver, possible right kidney cancer. According to patient patient had remote history of breast cancer status post left breast mastectomy never received chemotherapy or radiation for that. But has history of smoking and smoked for a long time, patient has 40-pack-year smoking history. Denies any shortness of breath no chest pain does have low back pain.  PAST MEDICAL HISTORY:   Past Medical History:  Diagnosis Date  . Atrial flutter ([place])    a. Dx 11/2015 -->CHA2DS2VASc = 7-->Eliquis '5mg'$  BID.  Marland Kitchen Breast cancer ([place])   . Chronic diastolic CHF (congestive heart failure) ([place])    a. 11/2015 Echo: EF 60-65%, no rwma, mild MR, mildly dil LA, nl RV fxn.  . Chronic Dyspnea on Exertion    a. 2004 Stress test prior to LE bypass->reportedly nl.  . Diabetes mellitus without complication ([place])    a. Dx as borderline diabetic in the 80's-->progressed over the years.   . Essential hypertension   . Lower extremity edema   . Morbid obesity ([place])   . PVC's (premature ventricular contractions)   . PVD (peripheral vascular disease) ([place])    a. 2004 s/p RLE bypass - complicated by recurrent infection.  . Stroke [facility])    a. TIA x 2;  b. Stroke in 2007 in [place].    PAST SURGICAL HISTOIRY:   Past Surgical History:  Procedure Laterality Date  . Cataract Surgery Right    03/2014  . CESAREAN SECTION    . ELECTROPHYSIOLOGIC STUDY N/A 08/09/2016   Procedure: CARDIOVERSION;  Surgeon: Wellington Hampshire, MD;  Location: [facility] ORS;  Service: Cardiovascular;  Laterality: N/A;  . LEG SURGERY    . MASTECTOMY Left   . right leg bypass Right     SOCIAL HISTORY:   Social History  Substance Use Topics  . Smoking status: Former Research scientist (life sciences)  . Smokeless tobacco: Former Systems developer  . Alcohol use No    FAMILY HISTORY:   Family History  Problem Relation Age of Onset  . Heart attack Father     died in his early 49's.  . Diabetes Father   . Cancer Mother     deceased  . Colon cancer Mother   . Cancer Brother     multiple cancers, deceased.  . Leukemia Brother   . Liver cancer Brother   . Pancreatic cancer Brother     DRUG ALLERGIES:  No Known Allergies  REVIEW OF SYSTEMS:  CONSTITUTIONAL:She has fatigue, weakness. Marland Kitchen  EYES: No blurred or double vision.  EARS, NOSE, AND THROAT: No tinnitus or ear pain.  RESPIRATORY: No cough, shortness of breath, wheezing or hemoptysis.  CARDIOVASCULAR: No chest pain, orthopnea, edema.  GASTROINTESTINAL: No nausea, vomiting, diarrhea or abdominal pain.  GENITOURINARY: No dysuria, hematuria.  ENDOCRINE: No polyuria, nocturia,  HEMATOLOGY: No anemia, easy bruising or bleeding SKIN: No rash or lesion. MUSCULOSKELETAL: Complains of pain in the legs, neuropathy NEUROLOGIC: No tingling, numbness, weakness.  PSYCHIATRY: No anxiety or depression.   MEDICATIONS AT HOME:   Prior to Admission medications   Medication Sig Start Date  End Date Taking? Authorizing Provider  apixaban (ELIQUIS) 5 MG TABS tablet Take 1 tablet (5 mg total) by mouth 2 (two) times daily. 01/28/16  Yes Minna Merritts, MD  atorvastatin (LIPITOR) 80 MG tablet Take 80 mg by mouth daily at 6 PM.  12/30/15  Yes Historical Provider, MD  diltiazem (CARDIZEM CD) 120 MG 24 hr capsule Take 1 capsule (120 mg total) by mouth daily. 08/10/16  Yes Vaughan Basta, MD  furosemide (LASIX) 40 MG tablet Take 1 tablet (40 mg total) by mouth 2 (two) times daily. Patient taking differently: Take 40 mg by mouth daily.  03/22/16  Yes Minna Merritts, MD  gabapentin (NEURONTIN) 100 MG capsule Take 1 capsule (100 mg total) by mouth daily. Patient taking differently: Take 200 mg by mouth 2 (two) times daily.  08/09/16 08/09/17 Yes Vaughan Basta, MD  insulin detemir (LEVEMIR) 100 UNIT/ML injection Inject 0.05 mLs (5 Units total) into the skin at bedtime. Patient taking differently: Inject 10 Units into the skin daily.  08/09/16  Yes Vaughan Basta, MD  lisinopril (PRINIVIL,ZESTRIL) 10 MG tablet Take 1 tablet (10 mg total) by mouth daily. 08/10/16  Yes Vaughan Basta, MD  metFORMIN (GLUCOPHAGE) 500 MG tablet Take 1,000 mg by mouth 2 (two) times daily.    Yes Historical Provider, MD  metoprolol tartrate (LOPRESSOR) 25 MG tablet Take 1 tablet (25 mg total) by mouth 2 (two) times daily. 08/11/16 11/09/16 Yes Christopher End, MD  Multiple Vitamin (MULTIVITAMIN WITH MINERALS) TABS tablet Take 1 tablet by mouth daily.   Yes Historical Provider, MD  omeprazole (PRILOSEC) 20 MG capsule Take 20 mg by mouth daily.   Yes Historical Provider, MD  oxybutynin (DITROPAN) 5 MG tablet Take 5 mg by mouth 2 (two) times daily.   Yes Historical Provider, MD  traZODone (DESYREL) 100 MG tablet Take 100 mg by mouth 2 (two) times daily. Takes with gabapentin.   Yes Historical Provider, MD  VENTOLIN HFA 108 (90 Base) MCG/ACT inhaler Inhale 2 puffs into the lungs every 6 (six) hours  as needed.  12/13/15   Historical Provider, MD      VITAL SIGNS:  Blood pressure (!) 140/55, pulse 80, temperature 97.6 F (36.4 C), temperature source Axillary, resp. rate 10, height 5' (1.524 m), weight 76.7 kg (169 lb), SpO2 96 %.  PHYSICAL EXAMINATION:  GENERAL:  61 y.o.-year-old patient lying in the bed with no acute distress.  EYES: Pupils equal, round, reactive to light and accommodation. No scleral icterus. Extraocular muscles intact.  HEENT: Head atraumatic, normocephalic. Oropharynx and nasopharynx clear.  NECK:  Supple, no jugular venous distention. No thyroid enlargement, no tenderness.  LUNGS: Normal breath sounds bilaterally, no wheezing, rales,rhonchi or crepitation. No use of accessory muscles of respiration.  CARDIOVASCULAR: S1, S2 normal. No murmurs, rubs, or gallops.  ABDOMEN: Soft, nontender, nondistended. Bowel sounds present. No organomegaly or mass.  EXTREMITIES: No pedal edema, cyanosis,  or clubbing.  NEUROLOGIC: Cranial nerves II through XII are intact. Muscle strength 5/5 in all extremities. Sensation intact. Gait not checked.  PSYCHIATRIC: The patient is alert and oriented x 3.  SKIN: No obvious rash, lesion, or ulcer.   LABORATORY PANEL:   CBC  Recent Labs Lab 10/01/16 1408  WBC 12.2*  HGB 13.1  HCT 38.5  PLT 337   ------------------------------------------------------------------------------------------------------------------  Chemistries   Recent Labs Lab 10/01/16 1408  NA 135  K 4.0  CL 91*  CO2 31  GLUCOSE 385*  BUN 38*  CREATININE 1.14*  CALCIUM 9.0   ------------------------------------------------------------------------------------------------------------------  Cardiac Enzymes  Recent Labs Lab 10/01/16 1408  TROPONINI 0.04*   ------------------------------------------------------------------------------------------------------------------  RADIOLOGY:  Dg Chest 1 View  Result Date: 10/01/2016 CLINICAL DATA:  Acute onset  of generalized weakness. Initial encounter. EXAM: CHEST 1 VIEW COMPARISON:  Chest radiographs performed 08/05/2016 and 12/11/2015 FINDINGS: Enlarging dense right hilar opacity is suspicious for malignancy. The left lung appears relatively clear. No pleural effusion or pneumothorax is seen. There is elevation of the right hemidiaphragm. The cardiomediastinal silhouette is normal in size. No acute osseous abnormalities are identified. IMPRESSION: Enlarging dense right hilar opacity is suspicious for malignancy. CT of the chest with contrast is recommended for further evaluation. Electronically Signed   By: Garald Balding M.D.   On: 10/01/2016 17:35   Ct Head Wo Contrast  Result Date: 10/01/2016 CLINICAL DATA:  Weakness for 2 weeks EXAM: CT HEAD WITHOUT CONTRAST TECHNIQUE: Contiguous axial images were obtained from the base of the skull through the vertex without intravenous contrast. COMPARISON:  None. FINDINGS: Brain: No evidence of acute infarction, hemorrhage, extra-axial collection, ventriculomegaly, or mass effect. Generalized cerebral atrophy. Periventricular white matter low attenuation likely secondary to microangiopathy. Vascular: Cerebrovascular atherosclerotic calcifications are noted. Skull: Negative for fracture or focal lesion. Sinuses/Orbits: Visualized portions of the orbits are unremarkable. Visualized portions of the paranasal sinuses and mastoid air cells are unremarkable. Other: None. IMPRESSION: 1. No acute intracranial pathology. 2. Chronic microvascular disease and cerebral atrophy. Electronically Signed   By: Kathreen Devoid   On: 10/01/2016 17:24   Ct Renal Stone Study  Result Date: 10/01/2016 CLINICAL DATA:  Flank pain and hematuria. EXAM: CT ABDOMEN AND PELVIS WITHOUT CONTRAST TECHNIQUE: Multidetector CT imaging of the abdomen and pelvis was performed following the standard protocol without IV contrast. COMPARISON:  None. FINDINGS: Lower chest:  No contributory findings. Hepatobiliary:  The liver is nearly replaced by innumerable masses.Cholelithiasis. No evidence of acute cholecystitis. Pancreas: The head and neck is indistinguishable from bulky retroperitoneal adenopathy. Spleen: Unremarkable. Adrenals/Urinary Tract: Negative adrenals. 4 cm mass exophytic from the upper pole left kidney. Small renal calculi may be present. No hydronephrosis or ureteral calculus (ovarian vein phlebolith noted on the right). 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 Asymmetric bladder wall thickening to the left. Stomach/Bowel:  No obstruction. No inflammatory changes. Vascular/Lymphatic: Bulky adenopathy in the upper abdomen with confluent nodal mass extending from the gastrohepatic ligament to the hepatic hilum and measuring up to 10 cm. Retroperitoneal adenopathy continues inferiorly to the level of the aortic bifurcation. Atherosclerotic calcification Reproductive:No pathologic findings. Other: No ascites or pneumoperitoneum. Musculoskeletal: Heterogeneous density of marrow without discrete destructive lesion. There is a L4 superior endplate with chronic appearance based on overlying vacuum phenomenon. Height loss is mild. Bone metastases may be present. IMPRESSION: 1. Extensive metastatic disease in the liver and retroperitoneal lymph nodes. Patient has history of breast cancer and there is a 4 cm left renal mass compatible with  renal cell carcinoma. 2. Asymmetric left bladder wall thickening. 3. L4 superior endplate fracture, with nonacute appearance. This may be pathologic. Electronically Signed   By: Monte Fantasia M.D.   On: 10/01/2016 17:57    EKG:   Orders placed or performed during the hospital encounter of 10/01/16  . ED EKG  . ED EKG  EKG shows PAC 73 bpm,  T-wave inversions in lead V5, V6.  IMPRESSION AND PLAN:  #40.61 year old female patient with generalized weakness and found to have extensive metastatic disease in the liver, retroperitoneal nodes, left renal mass, possible right lung  cancer  #2 UTI: Started on IV Rocephin, follow urine cultures. #3/ for newly diagnosed metastatic cancer patient will be seen by oncology, urology .4.L4 superior endplate fracture, with nonacute appearance. This may be pathologic; patient will get MRI of her lumbosacral spine.  physical therapy consult.  D/w daughter All the records are reviewed and case discussed with ED provider. Management plans discussed with the patient, family and they are in agreement.  CODE STATUS:  dnr  TOTAL TIME TAKING CARE OF THIS PATIENT: 55 minutes.    Epifanio Lesches M.D on 10/01/2016 at 8:28 PM  Between 7am to 6pm - Pager - [phone]  After 6pm go to [email or web address] - password [email or web address] [facility]  Office  [phone]  CC: Primary care physician; Donnie Coffin, MD  Note: This dictation was prepared with Dragon dictation along with smaller phrase technology. Any transcriptional errors that result from this process are unintentional.

## 2016-10-01 NOTE — ED Triage Notes (Signed)
Pt states yesterday she felt weak and noticed when she drank water it would run down her chin. Pt states today she is having back pain that is worse than normal and states her legs are weak and it is harder to walk.

## 2016-10-01 NOTE — ED Notes (Signed)
Patient given dinner tray. Patient in bed talking on phone. No other needs expressed at this time.

## 2016-10-02 ENCOUNTER — Inpatient Hospital Stay: Payer: Medicare Other

## 2016-10-02 DIAGNOSIS — R531 Weakness: Secondary | ICD-10-CM

## 2016-10-02 DIAGNOSIS — R0602 Shortness of breath: Secondary | ICD-10-CM

## 2016-10-02 DIAGNOSIS — E669 Obesity, unspecified: Secondary | ICD-10-CM

## 2016-10-02 DIAGNOSIS — I493 Ventricular premature depolarization: Secondary | ICD-10-CM

## 2016-10-02 DIAGNOSIS — Z853 Personal history of malignant neoplasm of breast: Secondary | ICD-10-CM

## 2016-10-02 DIAGNOSIS — Z8673 Personal history of transient ischemic attack (TIA), and cerebral infarction without residual deficits: Secondary | ICD-10-CM

## 2016-10-02 DIAGNOSIS — I10 Essential (primary) hypertension: Secondary | ICD-10-CM

## 2016-10-02 DIAGNOSIS — N2889 Other specified disorders of kidney and ureter: Secondary | ICD-10-CM

## 2016-10-02 DIAGNOSIS — K769 Liver disease, unspecified: Secondary | ICD-10-CM

## 2016-10-02 DIAGNOSIS — N390 Urinary tract infection, site not specified: Secondary | ICD-10-CM

## 2016-10-02 DIAGNOSIS — E119 Type 2 diabetes mellitus without complications: Secondary | ICD-10-CM

## 2016-10-02 DIAGNOSIS — R630 Anorexia: Secondary | ICD-10-CM

## 2016-10-02 DIAGNOSIS — R634 Abnormal weight loss: Secondary | ICD-10-CM

## 2016-10-02 DIAGNOSIS — I4892 Unspecified atrial flutter: Secondary | ICD-10-CM

## 2016-10-02 DIAGNOSIS — I739 Peripheral vascular disease, unspecified: Secondary | ICD-10-CM

## 2016-10-02 DIAGNOSIS — L8990 Pressure ulcer of unspecified site, unspecified stage: Secondary | ICD-10-CM | POA: Insufficient documentation

## 2016-10-02 DIAGNOSIS — I5032 Chronic diastolic (congestive) heart failure: Secondary | ICD-10-CM

## 2016-10-02 DIAGNOSIS — R609 Edema, unspecified: Secondary | ICD-10-CM

## 2016-10-02 LAB — BASIC METABOLIC PANEL
ANION GAP: 10 (ref 5–15)
BUN: 37 mg/dL — ABNORMAL HIGH (ref 6–20)
CHLORIDE: 94 mmol/L — AB (ref 101–111)
CO2: 32 mmol/L (ref 22–32)
CREATININE: 1.08 mg/dL — AB (ref 0.44–1.00)
Calcium: 8.6 mg/dL — ABNORMAL LOW (ref 8.9–10.3)
GFR calc non Af Amer: 54 mL/min — ABNORMAL LOW (ref >60)
Glucose, Bld: 361 mg/dL — ABNORMAL HIGH (ref 65–99)
Potassium: 3.7 mmol/L (ref 3.5–5.1)
SODIUM: 136 mmol/L (ref 135–145)

## 2016-10-02 LAB — GLUCOSE, CAPILLARY
GLUCOSE-CAPILLARY: 287 mg/dL — AB (ref 65–99)
GLUCOSE-CAPILLARY: 185 mg/dL — AB (ref 65–99)
Glucose-Capillary: 284 mg/dL — ABNORMAL HIGH (ref 65–99)

## 2016-10-02 LAB — LACTATE DEHYDROGENASE: LDH: 617 U/L — AB (ref 98–192)

## 2016-10-02 LAB — CBC
HCT: 37.3 % (ref 35.0–47.0)
Hemoglobin: 12 g/dL (ref 12.0–16.0)
MCH: 28.2 pg (ref 26.0–34.0)
MCHC: 32.3 g/dL (ref 32.0–36.0)
MCV: 87.2 fL (ref 80.0–100.0)
PLATELETS: 228 10*3/uL (ref 150–440)
RBC: 4.27 MIL/uL (ref 3.80–5.20)
RDW: 18.4 % — ABNORMAL HIGH (ref 11.5–14.5)
WBC: 9.2 10*3/uL (ref 3.6–11.0)

## 2016-10-02 LAB — PROTIME-INR
INR: 1.35
PROTHROMBIN TIME: 16.8 s — AB (ref 11.4–15.2)

## 2016-10-02 LAB — APTT: aPTT: 29 seconds (ref 24–36)

## 2016-10-02 MED ORDER — PREDNISONE 1 MG PO TABS
2.0000 mg | ORAL_TABLET | Freq: Every day | ORAL | Status: AC
Start: 2016-10-02 — End: 2016-10-03
  Administered 2016-10-02 – 2016-10-03 (×2): 2 mg via ORAL
  Filled 2016-10-02 (×2): qty 2

## 2016-10-02 MED ORDER — INSULIN ASPART 100 UNIT/ML ~~LOC~~ SOLN
0.0000 [IU] | Freq: Three times a day (TID) | SUBCUTANEOUS | Status: DC
Start: 2016-10-02 — End: 2016-10-09
  Administered 2016-10-02: 5 [IU] via SUBCUTANEOUS
  Administered 2016-10-03 – 2016-10-06 (×6): 1 [IU] via SUBCUTANEOUS
  Administered 2016-10-06 – 2016-10-07 (×2): 2 [IU] via SUBCUTANEOUS
  Administered 2016-10-07: 3 [IU] via SUBCUTANEOUS
  Administered 2016-10-08: 1 [IU] via SUBCUTANEOUS
  Administered 2016-10-08: 2 [IU] via SUBCUTANEOUS
  Filled 2016-10-02 (×2): qty 1
  Filled 2016-10-02: qty 2
  Filled 2016-10-02: qty 1
  Filled 2016-10-02: qty 3
  Filled 2016-10-02 (×2): qty 1
  Filled 2016-10-02 (×2): qty 2
  Filled 2016-10-02 (×2): qty 1
  Filled 2016-10-02: qty 5

## 2016-10-02 MED ORDER — PREDNISONE 1 MG PO TABS
1.0000 mg | ORAL_TABLET | Freq: Every day | ORAL | Status: AC
Start: 2016-10-04 — End: 2016-10-07
  Administered 2016-10-04 – 2016-10-07 (×4): 1 mg via ORAL
  Filled 2016-10-02 (×4): qty 1

## 2016-10-02 NOTE — Progress Notes (Signed)
PT Cancellation Note  Patient Details Name: Megan Cisneros MRN: [id number] DOB: 1956-06-15   Cancelled Treatment:    Reason Eval/Treat Not Completed: Patient at procedure or test/unavailable. PT entered patient's room, it appears she is off the floor for testing. Will re-attempt mobility evaluation once patient is appropriate and able.   Kerman Passey, PT, DPT    10/02/2016, 2:50 PM

## 2016-10-02 NOTE — Progress Notes (Signed)
[place] at [place] NAME: Megan Cisneros    MR#:  [id number]  DATE OF BIRTH:  1956-01-14  SUBJECTIVE:  CHIEF COMPLAINT:   Chief Complaint  Patient presents with  . Weakness     Weight loss and progressive weakness for last 1-2 months. Found to have UTI. On CT abd- have new findings of renal mass, metastasis to liver and retroperitoneal nodes.  REVIEW OF SYSTEMS:  CONSTITUTIONAL: No fever,positive for fatigue or weakness.  EYES: No blurred or double vision.  EARS, NOSE, AND THROAT: No tinnitus or ear pain.  RESPIRATORY: No cough, shortness of breath, wheezing or hemoptysis.  CARDIOVASCULAR: No chest pain, orthopnea, edema.  GASTROINTESTINAL: No nausea, vomiting, diarrhea or abdominal pain.  GENITOURINARY: No dysuria, hematuria.  ENDOCRINE: No polyuria, nocturia,  HEMATOLOGY: No anemia, easy bruising or bleeding SKIN: No rash or lesion. MUSCULOSKELETAL: No joint pain or arthritis.   NEUROLOGIC: No tingling, numbness, weakness.  PSYCHIATRY: No anxiety or depression.   ROS  DRUG ALLERGIES:  No Known Allergies  VITALS:  Blood pressure (!) 156/75, pulse 84, temperature 97.7 F (36.5 C), temperature source Oral, resp. rate 18, height 5' (1.524 m), weight 74.3 kg (163 lb 14.4 oz), SpO2 97 %.  PHYSICAL EXAMINATION:  GENERAL:  61 y.o.-year-old patient lying in the bed with no acute distress.  EYES: Pupils equal, round, reactive to light and accommodation. No scleral icterus. Extraocular muscles intact.  HEENT: Head atraumatic, normocephalic. Oropharynx and nasopharynx clear.  NECK:  Supple, no jugular venous distention. No thyroid enlargement, no tenderness.  LUNGS: Normal breath sounds bilaterally, no wheezing, rales,rhonchi or crepitation. No use of accessory muscles of respiration.  CARDIOVASCULAR: S1, S2 normal. No murmurs, rubs, or gallops.  ABDOMEN: Soft, nontender, nondistended. Bowel sounds present. No organomegaly or mass.   EXTREMITIES: No pedal edema, cyanosis, or clubbing.  NEUROLOGIC: Cranial nerves II through XII are intact. Muscle strength 4/5 in all extremities. Sensation intact. Gait not checked.  PSYCHIATRIC: The patient is alert and oriented x 3.  SKIN: No obvious rash, lesion, or ulcer.   Physical Exam LABORATORY PANEL:   CBC  Recent Labs Lab 10/02/16 0458  WBC 9.2  HGB 12.0  HCT 37.3  PLT 228   ------------------------------------------------------------------------------------------------------------------  Chemistries   Recent Labs Lab 10/02/16 0458  NA 136  K 3.7  CL 94*  CO2 32  GLUCOSE 361*  BUN 37*  CREATININE 1.08*  CALCIUM 8.6*   ------------------------------------------------------------------------------------------------------------------  Cardiac Enzymes  Recent Labs Lab 10/01/16 1408  TROPONINI 0.04*   ------------------------------------------------------------------------------------------------------------------  RADIOLOGY:  Dg Chest 1 View  Result Date: 10/01/2016 CLINICAL DATA:  Acute onset of generalized weakness. Initial encounter. EXAM: CHEST 1 VIEW COMPARISON:  Chest radiographs performed 08/05/2016 and 12/11/2015 FINDINGS: Enlarging dense right hilar opacity is suspicious for malignancy. The left lung appears relatively clear. No pleural effusion or pneumothorax is seen. There is elevation of the right hemidiaphragm. The cardiomediastinal silhouette is normal in size. No acute osseous abnormalities are identified. IMPRESSION: Enlarging dense right hilar opacity is suspicious for malignancy. CT of the chest with contrast is recommended for further evaluation. Electronically Signed   By: Garald Balding M.D.   On: 10/01/2016 17:35   Ct Head Wo Contrast  Result Date: 10/01/2016 CLINICAL DATA:  Weakness for 2 weeks EXAM: CT HEAD WITHOUT CONTRAST TECHNIQUE: Contiguous axial images were obtained from the base of the skull through the vertex without  intravenous contrast. COMPARISON:  None. FINDINGS: Brain: No evidence of acute infarction,  hemorrhage, extra-axial collection, ventriculomegaly, or mass effect. Generalized cerebral atrophy. Periventricular white matter low attenuation likely secondary to microangiopathy. Vascular: Cerebrovascular atherosclerotic calcifications are noted. Skull: Negative for fracture or focal lesion. Sinuses/Orbits: Visualized portions of the orbits are unremarkable. Visualized portions of the paranasal sinuses and mastoid air cells are unremarkable. Other: None. IMPRESSION: 1. No acute intracranial pathology. 2. Chronic microvascular disease and cerebral atrophy. Electronically Signed   By: Kathreen Devoid   On: 10/01/2016 17:24   Ct Renal Stone Study  Result Date: 10/01/2016 CLINICAL DATA:  Flank pain and hematuria. EXAM: CT ABDOMEN AND PELVIS WITHOUT CONTRAST TECHNIQUE: Multidetector CT imaging of the abdomen and pelvis was performed following the standard protocol without IV contrast. COMPARISON:  None. FINDINGS: Lower chest:  No contributory findings. Hepatobiliary: The liver is nearly replaced by innumerable masses.Cholelithiasis. No evidence of acute cholecystitis. Pancreas: The head and neck is indistinguishable from bulky retroperitoneal adenopathy. Spleen: Unremarkable. Adrenals/Urinary Tract: Negative adrenals. 4 cm mass exophytic from the upper pole left kidney. Small renal calculi may be present. No hydronephrosis or ureteral calculus (ovarian vein phlebolith noted on the right). 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 Asymmetric bladder wall thickening to the left. Stomach/Bowel:  No obstruction. No inflammatory changes. Vascular/Lymphatic: Bulky adenopathy in the upper abdomen with confluent nodal mass extending from the gastrohepatic ligament to the hepatic hilum and measuring up to 10 cm. Retroperitoneal adenopathy continues inferiorly to the level of the aortic bifurcation. Atherosclerotic calcification Reproductive:No  pathologic findings. Other: No ascites or pneumoperitoneum. Musculoskeletal: Heterogeneous density of marrow without discrete destructive lesion. There is a L4 superior endplate with chronic appearance based on overlying vacuum phenomenon. Height loss is mild. Bone metastases may be present. IMPRESSION: 1. Extensive metastatic disease in the liver and retroperitoneal lymph nodes. Patient has history of breast cancer and there is a 4 cm left renal mass compatible with renal cell carcinoma. 2. Asymmetric left bladder wall thickening. 3. L4 superior endplate fracture, with nonacute appearance. This may be pathologic. Electronically Signed   By: Monte Fantasia M.D.   On: 10/01/2016 17:57    ASSESSMENT AND PLAN:   Active Problems:   Metastatic lung cancer (metastasis from lung to other site) ([place])   Pressure injury of skin   #3.61 year old female patient with generalized weakness and found to have extensive metastatic disease in the liver, retroperitoneal nodes, left renal mass, possible right lung cancer Onco consult, may need biopsy.  #2 UTI: Started on IV Rocephin, follow urine cultures. #3/ for newly diagnosed metastatic cancer patient will be seen by oncology, urology #4.L4 superior endplate fracture, with nonacute appearance. This may be pathologic; will get ortho consult.  physical therapy consult. #5 Hx of A fib with Ch diastolic CHF   Currently stable, cont oral meds    Hold Eliquis, as will need Biopsy per oncology. #6 Diabetes   Cont metformin, insulin sliding scale coverage.    All the records are reviewed and case discussed with Care Management/Social Workerr. Management plans discussed with the patient, family and they are in agreement.  CODE STATUS: Full.  TOTAL TIME TAKING CARE OF THIS PATIENT: 35 minutes.     POSSIBLE D/C IN 2-3 DAYS, DEPENDING ON CLINICAL CONDITION.   Vaughan Basta M.D on 10/02/2016   Between 7am to 6pm - Pager - [phone]  After 6pm  go to [email or web address] - password [email or web address] [facility]  Office  [phone]  CC: Primary care physician; Donnie Coffin, MD  Note: This dictation was prepared with Viviann Spare  dictation along with smaller phrase technology. Any transcriptional errors that result from this process are unintentional.

## 2016-10-02 NOTE — Progress Notes (Signed)
[facility] CONSULT NOTE  Patient Care Team: Donnie Coffin, MD as PCP - General (Family Medicine) Minna Merritts, MD as Consulting Physician (Cardiology)  CHIEF COMPLAINTS/PURPOSE OF CONSULTATION: Possible metastatic disease  HISTORY OF PRESENTING ILLNESS:  Megan Cisneros 61 y.o.  female with remote history of breast cancer; also history of a flutter [on eliquis] and congestive heart failure is currently admitted to the hospital for generalized weakness. Patient was noted to have a UTI in the emergency room. Further imaging of the abdomen with the stone protocol CT- showed approximate 4 cm kidney mass; also retroperitoneal lymphadenopathy and multiple liver lesions.  Patient complains of weight loss over the last 2-3 months. Completes a poor appetite. No nausea no vomiting. No blood in stools or black stools. Complains of history of recurrent left lower extremity cellulitis.    With regards to history of breast cancer more than 20 years ago; diagnosed and treated in [place]. Patient does not remember the stage of cancer; she states to have mastectomy; with no adjuvant chemotherapy or radiation.  ROS: A complete 10 point review of system is done which is negative except mentioned above in history of present illness  MEDICAL HISTORY:  Past Medical History:  Diagnosis Date  . Atrial flutter ([place])    a. Dx 11/2015 -->CHA2DS2VASc = 7-->Eliquis '5mg'$  BID.  Marland Kitchen Breast cancer ([address])   . Chronic diastolic CHF (congestive heart failure) ([place])    a. 11/2015 Echo: EF 60-65%, no rwma, mild MR, mildly dil LA, nl RV fxn.  . Chronic Dyspnea on Exertion    a. 2004 Stress test prior to LE bypass->reportedly nl.  . Diabetes mellitus without complication ([place])    a. Dx as borderline diabetic in the 80's-->progressed over the years.  . Essential hypertension   . Lower extremity edema   . Morbid obesity ([place])   . PVC's (premature ventricular contractions)   . PVD (peripheral vascular disease)  ([place])    a. 2004 s/p RLE bypass - complicated by recurrent infection.  . Stroke [facility])    a. TIA x 2;  b. Stroke in 2007 in [place].    SURGICAL HISTORY: Past Surgical History:  Procedure Laterality Date  . Cataract Surgery Right    03/2014  . CESAREAN SECTION    . ELECTROPHYSIOLOGIC STUDY N/A 08/09/2016   Procedure: CARDIOVERSION;  Surgeon: Wellington Hampshire, MD;  Location: [facility] ORS;  Service: Cardiovascular;  Laterality: N/A;  . LEG SURGERY    . MASTECTOMY Left   . 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 Hx of smoking quit 20 years ago. Patient used to live in [place]. Currently lives in [place] with her daughter.   Social History   Social History  . Marital status: Widowed    Spouse name: N/A  . Number of children: N/A  . Years of education: N/A   Occupational History  . Not on file.   Social History Main Topics  . Smoking status: Former Research scientist (life sciences)  . Smokeless tobacco: Never Used  . Alcohol use No  . Drug use: No  . Sexual activity: Not on file   Other Topics Concern  . Not on file   Social History Narrative   Lives in [place] by herself.  Originally from [place], [place], later moved to [place] and then [place].  Has been in [place] for 8 yrs.  Family nearby.  Does not routinely exercise.   Has a cane  and a walker    FAMILY HISTORY: Family History  Problem Relation Age of Onset  . Heart attack Father     died in his early 57's.  . Diabetes Father   . Cancer Mother     deceased  . Colon cancer Mother   . Cancer Brother     multiple cancers, deceased.  . Leukemia Brother   . Liver cancer Brother   . Pancreatic cancer Brother     ALLERGIES:  has No Known Allergies.  MEDICATIONS:  Current Facility-Administered Medications  Medication Dose Route Frequency Provider Last Rate Last Dose  . 0.9 %  sodium chloride infusion   Intravenous Continuous Epifanio Lesches, MD 50 mL/hr at 10/01/16 2209    .  acetaminophen (TYLENOL) tablet 650 mg  650 mg Oral Q6H PRN Epifanio Lesches, MD       Or  . acetaminophen (TYLENOL) suppository 650 mg  650 mg Rectal Q6H PRN Epifanio Lesches, MD      . albuterol (PROVENTIL) (2.5 MG/3ML) 0.083% nebulizer solution 2.5 mg  2.5 mg Inhalation Q4H PRN Epifanio Lesches, MD      . atorvastatin (LIPITOR) tablet 80 mg  80 mg Oral q1800 Epifanio Lesches, MD      . bisacodyl (DULCOLAX) EC tablet 5 mg  5 mg Oral Daily PRN Epifanio Lesches, MD      . cefTRIAXone (ROCEPHIN) IVPB 1 g  1 g Intravenous Q24H Epifanio Lesches, MD      . diltiazem (CARDIZEM CD) 24 hr capsule 120 mg  120 mg Oral Daily Epifanio Lesches, MD      . docusate sodium (COLACE) capsule 100 mg  100 mg Oral BID Epifanio Lesches, MD   100 mg at 10/01/16 2210  . furosemide (LASIX) tablet 40 mg  40 mg Oral BID Epifanio Lesches, MD   40 mg at 10/02/16 0739  . gabapentin (NEURONTIN) capsule 100 mg  100 mg Oral BID Alexis Hugelmeyer, DO   100 mg at 10/01/16 2210  . heparin injection 5,000 Units  5,000 Units Subcutaneous Q8H Epifanio Lesches, MD   5,000 Units at 10/02/16 3149  . HYDROcodone-acetaminophen (NORCO/VICODIN) 5-325 MG per tablet 1-2 tablet  1-2 tablet Oral Q4H PRN Epifanio Lesches, MD      . insulin detemir (LEVEMIR) injection 5 Units  5 Units Subcutaneous QHS Epifanio Lesches, MD      . lisinopril (PRINIVIL,ZESTRIL) tablet 10 mg  10 mg Oral Daily Epifanio Lesches, MD      . metFORMIN (GLUCOPHAGE) tablet 1,000 mg  1,000 mg Oral BID WC Epifanio Lesches, MD   1,000 mg at 10/02/16 0739  . metoprolol tartrate (LOPRESSOR) tablet 25 mg  25 mg Oral BID Epifanio Lesches, MD   25 mg at 10/01/16 2210  . multivitamin with minerals tablet 1 tablet  1 tablet Oral Daily Epifanio Lesches, MD      . ondansetron (ZOFRAN) tablet 4 mg  4 mg Oral Q6H PRN Epifanio Lesches, MD       Or  . ondansetron (ZOFRAN) injection 4 mg  4 mg Intravenous Q6H PRN Epifanio Lesches, MD   4  mg at 10/02/16 0120  . oxybutynin (DITROPAN) tablet 5 mg  5 mg Oral BID Epifanio Lesches, MD   5 mg at 10/01/16 2209  . pantoprazole (PROTONIX) EC tablet 40 mg  40 mg Oral Daily Epifanio Lesches, MD      . traZODone (DESYREL) tablet 100 mg  100 mg Oral BID Epifanio Lesches, MD   100 mg at 10/01/16  2210  . traZODone (DESYREL) tablet 25 mg  25 mg Oral QHS PRN Epifanio Lesches, MD          .  PHYSICAL EXAMINATION:  Vitals:   10/01/16 2038 10/02/16 0412  BP: (!) 162/69 (!) 152/59  Pulse: 75 70  Resp: 20 20  Temp: 98 F (36.7 C) 97.5 F (36.4 C)   Filed Weights   10/01/16 1402 10/01/16 2038 10/02/16 0500  Weight: 169 lb (76.7 kg) 163 lb 14.4 oz (74.3 kg) 163 lb 14.4 oz (74.3 kg)    GENERAL: Well-nourished well-developed; Alert, no distress and comfortable.   Alone. EYES: no pallor or icterus OROPHARYNX: no thrush or ulceration. Poor dentition NECK: supple, no masses felt LYMPH:  no palpable lymphadenopathy in the cervical, axillary or inguinal regions LUNGS: decreased breath sounds to auscultation at bases and  No wheeze or crackles HEART/CVS: regular rate & rhythm and no murmurs; No lower extremity edema ABDOMEN: abdomen soft, non-tender and normal bowel sounds Musculoskeletal:no cyanosis of digits and no clubbing  PSYCH: alert & oriented x 3 with fluent speech NEURO: no focal motor/sensory deficits SKIN:  no rashes or significant lesions  LABORATORY DATA:  I have reviewed the data as listed Lab Results  Component Value Date   WBC 9.2 10/02/2016   HGB 12.0 10/02/2016   HCT 37.3 10/02/2016   MCV 87.2 10/02/2016   PLT 228 10/02/2016    Recent Labs  08/09/16 0516 10/01/16 1408 10/02/16 0458  NA 136 135 136  K 4.1 4.0 3.7  CL 93* 91* 94*  CO2 33* 31 32  GLUCOSE 155* 385* 361*  BUN 33* 38* 37*  CREATININE 1.28* 1.14* 1.08*  CALCIUM 9.4 9.0 8.6*  GFRNONAA 44* 50* 54*  GFRAA 51* 58* >60    RADIOGRAPHIC STUDIES: I have personally reviewed the  radiological images as listed and agreed with the findings in the report. Dg Chest 1 View  Result Date: 10/01/2016 CLINICAL DATA:  Acute onset of generalized weakness. Initial encounter. EXAM: CHEST 1 VIEW COMPARISON:  Chest radiographs performed 08/05/2016 and 12/11/2015 FINDINGS: Enlarging dense right hilar opacity is suspicious for malignancy. The left lung appears relatively clear. No pleural effusion or pneumothorax is seen. There is elevation of the right hemidiaphragm. The cardiomediastinal silhouette is normal in size. No acute osseous abnormalities are identified. IMPRESSION: Enlarging dense right hilar opacity is suspicious for malignancy. CT of the chest with contrast is recommended for further evaluation. Electronically Signed   By: Garald Balding M.D.   On: 10/01/2016 17:35   Ct Head Wo Contrast  Result Date: 10/01/2016 CLINICAL DATA:  Weakness for 2 weeks EXAM: CT HEAD WITHOUT CONTRAST TECHNIQUE: Contiguous axial images were obtained from the base of the skull through the vertex without intravenous contrast. COMPARISON:  None. FINDINGS: Brain: No evidence of acute infarction, hemorrhage, extra-axial collection, ventriculomegaly, or mass effect. Generalized cerebral atrophy. Periventricular white matter low attenuation likely secondary to microangiopathy. Vascular: Cerebrovascular atherosclerotic calcifications are noted. Skull: Negative for fracture or focal lesion. Sinuses/Orbits: Visualized portions of the orbits are unremarkable. Visualized portions of the paranasal sinuses and mastoid air cells are unremarkable. Other: None. IMPRESSION: 1. No acute intracranial pathology. 2. Chronic microvascular disease and cerebral atrophy. Electronically Signed   By: Kathreen Devoid   On: 10/01/2016 17:24   Ct Renal Stone Study  Result Date: 10/01/2016 CLINICAL DATA:  Flank pain and hematuria. EXAM: CT ABDOMEN AND PELVIS WITHOUT CONTRAST TECHNIQUE: Multidetector CT imaging of the abdomen and pelvis was  performed following  the standard protocol without IV contrast. COMPARISON:  None. FINDINGS: Lower chest:  No contributory findings. Hepatobiliary: The liver is nearly replaced by innumerable masses.Cholelithiasis. No evidence of acute cholecystitis. Pancreas: The head and neck is indistinguishable from bulky retroperitoneal adenopathy. Spleen: Unremarkable. Adrenals/Urinary Tract: Negative adrenals. 4 cm mass exophytic from the upper pole left kidney. Small renal calculi may be present. No hydronephrosis or ureteral calculus (ovarian vein phlebolith noted on the right). 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 Asymmetric bladder wall thickening to the left. Stomach/Bowel:  No obstruction. No inflammatory changes. Vascular/Lymphatic: Bulky adenopathy in the upper abdomen with confluent nodal mass extending from the gastrohepatic ligament to the hepatic hilum and measuring up to 10 cm. Retroperitoneal adenopathy continues inferiorly to the level of the aortic bifurcation. Atherosclerotic calcification Reproductive:No pathologic findings. Other: No ascites or pneumoperitoneum. Musculoskeletal: Heterogeneous density of marrow without discrete destructive lesion. There is a L4 superior endplate with chronic appearance based on overlying vacuum phenomenon. Height loss is mild. Bone metastases may be present. IMPRESSION: 1. Extensive metastatic disease in the liver and retroperitoneal lymph nodes. Patient has history of breast cancer and there is a 4 cm left renal mass compatible with renal cell carcinoma. 2. Asymmetric left bladder wall thickening. 3. L4 superior endplate fracture, with nonacute appearance. This may be pathologic. Electronically Signed   By: Monte Fantasia M.D.   On: 10/01/2016 17:57    ASSESSMENT & PLAN:  # 61 year old female patient currently admitted to the hospital for generalized weakness/UTI. Imaging shows concerns for metastatic disease  # Kidney mass approximately 4 cm; Multiple liver lesions;   with retroperitoneal adenopathy- highly concerning for metastatic disease. Question kidney primary versus breast versus others. Patient had a prior history of breast cancer 20 years ago. Patient will need a biopsy of the most accessible lesion likely liver.  # I would recommend holding the Eliquis in anticipation of the need for biopsy.  # Multiple comorbidities including- history of stroke/TIA; atrial flutter on Eliquis; CHF; history of peripheral vascular disease.  # Encouraged the patient to have her daughter be present tomorrow morning; to go over the plan again.  # discussed with Dr.Vachhani.  Thank you Dr.Vachhani for allowing me to participate in the care of your pleasant patient. Please do not hesitate to contact me with questions or concerns in the interim.  All questions were answered.  I reviewed the blood work- with the patient in detail; also reviewed the imaging independently [as summarized above]; and with the patient in detail.      Cammie Sickle, MD 10/02/2016 9:52 AM

## 2016-10-03 ENCOUNTER — Encounter: Payer: Self-pay | Admitting: Radiology

## 2016-10-03 ENCOUNTER — Inpatient Hospital Stay: Payer: Medicare Other

## 2016-10-03 LAB — GLUCOSE, CAPILLARY
GLUCOSE-CAPILLARY: 134 mg/dL — AB (ref 65–99)
Glucose-Capillary: 108 mg/dL — ABNORMAL HIGH (ref 65–99)
Glucose-Capillary: 150 mg/dL — ABNORMAL HIGH (ref 65–99)
Glucose-Capillary: 143 mg/dL — ABNORMAL HIGH (ref 65–99)

## 2016-10-03 LAB — CEA: CEA: 1538 ng/mL — AB (ref 0.0–4.7)

## 2016-10-03 LAB — CANCER ANTIGEN 15-3: CA 15-3: 126.3 U/mL — ABNORMAL HIGH (ref 0.0–25.0)

## 2016-10-03 MED ORDER — IOPAMIDOL (ISOVUE-300) INJECTION 61%
30.0000 mL | Freq: Once | INTRAVENOUS | Status: AC
  Administered 2016-10-03: 13:00:00 30 mL via ORAL

## 2016-10-03 MED ORDER — ENSURE ENLIVE PO LIQD
237.0000 mL | Freq: Two times a day (BID) | ORAL | Status: DC
  Administered 2016-10-03 – 2016-10-07 (×4): 237 mL via ORAL

## 2016-10-03 MED ORDER — IOPAMIDOL (ISOVUE-300) INJECTION 61%
100.0000 mL | Freq: Once | INTRAVENOUS | Status: AC | PRN
  Administered 2016-10-03: 16:00:00 100 mL via INTRAVENOUS

## 2016-10-03 NOTE — Progress Notes (Signed)
Initial Nutrition Assessment  DOCUMENTATION CODES:   Severe malnutrition in context of chronic illness  INTERVENTION:  1. Ensure Enlive po BID, each supplement provides 350 kcal and 20 grams of protein  NUTRITION DIAGNOSIS:   Malnutrition related to chronic illness as evidenced by moderate depletions of muscle mass, severe depletion of muscle mass, percent weight loss, energy intake < 75% for > or equal to 1 month.  GOAL:   Patient will meet greater than or equal to 90% of their needs  MONITOR:   PO intake, Megan Cisneros & O's, Labs, Weight trends, Supplement acceptance  REASON FOR ASSESSMENT:   Malnutrition Screening Tool    ASSESSMENT:   Megan Cisneros  is a 61 y.o. female with a known history ofAtrial fibrillation, diabetes mellitus type 2, essential hypertension, morbid obesity, PVD, history of remote breast cancer 20 years ago comes in because of generalized weakness, history of fall today.  Spoke with Megan Cisneros at bedside. She reports poor appetite and PO intake for 1.5 months Normally eat 3 meals per day - very small meals. 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 Complains of food getting stuck and taking hours to go down. States "very recently," she has been drinking milkshakes, frozen yogurt to try and help combat this. Encouraged her to continue to do that. Exhibits a 50#/23% severe wt loss over 9 months. She had consumed about 50% of an ensure during my visit. Was drinking contrast prior to a CAT scan. Nutrition-Focused physical exam completed. Findings are moderate fat depletion, moderate-severe muscle depletion, and no edema.   Labs and medications reviewed: CBGs 150-185 Colace, Prednisone, Lasix, MVI w/ Minerals Levemir 5 units QHS NS @ 39m/hr  Diet Order:  Diet heart healthy/carb modified Room service appropriate? Yes; Fluid consistency: Thin Diet NPO time specified Except for: Megan Cisneros Sips with Meds  Skin:  Wound (see comment) (Stg  Megan Cisneros over buttocks)  Last BM:  2/2  Height:   Ht Readings from Last 1 Encounters:  10/01/16 5' (1.524 m)    Weight:   Wt Readings from Last 1 Encounters:  10/03/16 166 lb 12.8 oz (75.7 kg)    Ideal Body Weight:  45.45 kg  BMI:  Body mass index is 32.58 kg/m.  Estimated Nutritional Needs:   Kcal:  2260-2640 calories (30-35 cal/kg)  Protein:  98-128 gm  Fluid:  >/= 2.2L  EDUCATION NEEDS:   No education needs identified at this time  WSatira Cisneros Megan Dysert, MS, RD LDN Inpatient Clinical Dietitian Pager [phone]

## 2016-10-03 NOTE — Evaluation (Signed)
Physical Therapy Evaluation Patient Details Name: Megan Cisneros MRN: [id number] DOB: [birth year] Today's Date: 10/03/2016   History of Present Illness  Pt is a 61 y/o female that presents with progressive weakness. Work up has revealed UTI, renal mass, liver mass.   Clinical Impression  Patient evaluated for generalized weakness, no clear pattern determined in this session, though RLE appears to have more weakness than LLE. She did report R hip pain, though this sounded chronic and was able to weight bear through RLE. She requires significant assistance for bed mobility, transfers, and out of bed mobility indicating she is likely more appropriate for SNF placement at this time to increase her independence with mobility.     Follow Up Recommendations SNF    Equipment Recommendations       Recommendations for Other Services       Precautions / Restrictions Precautions Precautions: Fall Restrictions Weight Bearing Restrictions: No      Mobility  Bed Mobility Overal bed mobility: Needs Assistance Bed Mobility: Supine to Sit     Supine to sit: Mod assist;HOB elevated     General bed mobility comments: Patient is able to maintain trunk height with HOB elevated, but requires PT assistance to bring LEs off the edge of the bed.   Transfers Overall transfer level: Needs assistance Equipment used: Rolling walker (2 wheeled) Transfers: Sit to/from Stand Sit to Stand: Min assist;Mod assist         General transfer comment: Patient requires min cuing for hand placement, assistance with transfer secondary to LE weakness.   Ambulation/Gait Ambulation/Gait assistance: Min assist Ambulation Distance (Feet): 2 Feet Assistive device: Rolling walker (2 wheeled)       General Gait Details: Patient is able to take small steps to turn while in standing to complete bed to chair transfer, cuing to maintain erect posture.   Stairs            Wheelchair Mobility    Modified Rankin  (Stroke Patients Only)       Balance Overall balance assessment: Needs assistance;History of Falls Sitting-balance support: Bilateral upper extremity supported Sitting balance-Leahy Scale: Fair   Postural control: Left lateral lean Standing balance support: Bilateral upper extremity supported Standing balance-Leahy Scale: Fair                               Pertinent Vitals/Pain Pain Assessment: Faces Faces Pain Scale: Hurts little more Pain Location: R hip, reports as chronic  Pain Descriptors / Indicators: Aching Pain Intervention(s): Limited activity within patient's tolerance;Monitored during session;Repositioned    Home Living Family/patient expects to be discharged to:: Private residence Living Arrangements: Children Available Help at Discharge: Family;Available PRN/intermittently Type of Home: Apartment Home Access: Level entry     Home Layout: One level Home Equipment: Cane - single point;Walker - 4 wheels;Shower seat      Prior Function Level of Independence: Independent with assistive device(s)         Comments: Patient has recently been independent with ADLs and light community mobility. She uses rollator for both inside and outside of apartment. She reports a fall last week and progressively weakening LEs.      Hand Dominance   Dominant Hand: Right    Extremity/Trunk Assessment   Upper Extremity Assessment Upper Extremity Assessment: Generalized weakness    Lower Extremity Assessment Lower Extremity Assessment: Generalized weakness       Communication   Communication: No difficulties  Cognition  Arousal/Alertness: Awake/alert Behavior During Therapy: WFL for tasks assessed/performed Overall Cognitive Status: Within Functional Limits for tasks assessed                      General Comments      Exercises     Assessment/Plan    PT Assessment Patient needs continued PT services  PT Problem List Decreased  strength;Decreased mobility;Decreased activity tolerance;Decreased balance          PT Treatment Interventions DME instruction;Therapeutic activities;Therapeutic exercise;Gait training;Stair training;Balance training;Functional mobility training;Neuromuscular re-education    PT Goals (Current goals can be found in the Care Plan section)  Acute Rehab PT Goals Patient Stated Goal: To get stronger  PT Goal Formulation: With patient Time For Goal Achievement: 10/17/16 Potential to Achieve Goals: Good    Frequency Min 2X/week   Barriers to discharge        Co-evaluation               End of Session Equipment Utilized During Treatment: Gait belt Activity Tolerance: Patient tolerated treatment well;Patient limited by fatigue Patient left: in chair;with chair alarm set;with call bell/phone within reach Nurse Communication: Mobility status         Time: 9476-5465 PT Time Calculation (min) (ACUTE ONLY): 18 min   Charges:   PT Evaluation $PT Eval Moderate Complexity: 1 Procedure     PT G Codes:       Kerman Passey, PT, DPT    10/03/2016, 1:09 PM

## 2016-10-03 NOTE — Consult Note (Signed)
ORTHOPAEDIC CONSULTATION  REQUESTING PHYSICIAN: Vaughan Basta, MD  Chief Complaint: Low back pain  HPI: Megan Cisneros is a 61 y.o. female who complains of  low back pain.  She has had back problems for many years, but had a fall a few days ago and has an increased lumbar pain.  Her pelvis is sore.  She denies any hip pain.  She has been admitted to [facility] with a recent diagnosis of metastatic cancer.  The origin is still uncertain.  Past Medical History:  Diagnosis Date  . Atrial flutter ([place])    a. Dx 11/2015 -->CHA2DS2VASc = 7-->Eliquis '5mg'$  BID.  Marland Kitchen Breast cancer ([place])   . Chronic diastolic CHF (congestive heart failure) ([place])    a. 11/2015 Echo: EF 60-65%, no rwma, mild MR, mildly dil LA, nl RV fxn.  . Chronic Dyspnea on Exertion    a. 2004 Stress test prior to LE bypass->reportedly nl.  . Diabetes mellitus without complication ([place])    a. Dx as borderline diabetic in the 80's-->progressed over the years.  . Essential hypertension   . Lower extremity edema   . Morbid obesity ([place])   . PVC's (premature ventricular contractions)   . PVD (peripheral vascular disease) ([place])    a. 2004 s/p RLE bypass - complicated by recurrent infection.  . Stroke [facility])    a. TIA x 2;  b. Stroke in 2007 in [place].   Past Surgical History:  Procedure Laterality Date  . Cataract Surgery Right    03/2014  . CESAREAN SECTION    . ELECTROPHYSIOLOGIC STUDY N/A 08/09/2016   Procedure: CARDIOVERSION;  Surgeon: Wellington Hampshire, MD;  Location: [facility] ORS;  Service: Cardiovascular;  Laterality: N/A;  . LEG SURGERY    . MASTECTOMY Left   . right leg bypass Right    Social History   Social History  . Marital status: Widowed    Spouse name: N/A  . Number of children: N/A  . Years of education: N/A   Social History Main Topics  . Smoking status: Former Research scientist (life sciences)  . Smokeless tobacco: Never Used  . Alcohol use No  . Drug use: No  . Sexual activity: Not Asked   Other  Topics Concern  . None   Social History Narrative   Lives in [place] by herself.  Originally from [place], [place], later moved to [place] and then [place].  Has been in [place] for 8 yrs.  Family nearby.  Does not routinely exercise.   Has a cane and a walker   Family History  Problem Relation Age of Onset  . Heart attack Father     died in his early 62's.  . Diabetes Father   . Cancer Mother     deceased  . Colon cancer Mother   . Cancer Brother     multiple cancers, deceased.  . Leukemia Brother   . Liver cancer Brother   . Pancreatic cancer Brother    No Known Allergies Prior to Admission medications   Medication Sig Start Date End Date Taking? Authorizing Provider  apixaban (ELIQUIS) 5 MG TABS tablet Take 1 tablet (5 mg total) by mouth 2 (two) times daily. 01/28/16  Yes Minna Merritts, MD  atorvastatin (LIPITOR) 80 MG tablet Take 80 mg by mouth daily at 6 PM.  12/30/15  Yes Historical Provider, MD  diltiazem (CARDIZEM CD) 120 MG 24 hr capsule Take 1 capsule (120 mg total) by mouth daily. 08/10/16  Yes Vaughan Basta, MD  furosemide (  LASIX) 40 MG tablet Take 1 tablet (40 mg total) by mouth 2 (two) times daily. Patient taking differently: Take 40 mg by mouth daily.  03/22/16  Yes Minna Merritts, MD  gabapentin (NEURONTIN) 100 MG capsule Take 1 capsule (100 mg total) by mouth daily. Patient taking differently: Take 200 mg by mouth 2 (two) times daily.  08/09/16 08/09/17 Yes Vaughan Basta, MD  insulin detemir (LEVEMIR) 100 UNIT/ML injection Inject 0.05 mLs (5 Units total) into the skin at bedtime. Patient taking differently: Inject 10 Units into the skin daily.  08/09/16  Yes Vaughan Basta, MD  lisinopril (PRINIVIL,ZESTRIL) 10 MG tablet Take 1 tablet (10 mg total) by mouth daily. 08/10/16  Yes Vaughan Basta, MD  metFORMIN (GLUCOPHAGE) 500 MG tablet Take 1,000 mg by mouth 2 (two) times daily.    Yes Historical Provider, MD  metoprolol tartrate (LOPRESSOR) 25 MG tablet  Take 1 tablet (25 mg total) by mouth 2 (two) times daily. 08/11/16 11/09/16 Yes Christopher End, MD  Multiple Vitamin (MULTIVITAMIN WITH MINERALS) TABS tablet Take 1 tablet by mouth daily.   Yes Historical Provider, MD  omeprazole (PRILOSEC) 20 MG capsule Take 20 mg by mouth daily.   Yes Historical Provider, MD  oxybutynin (DITROPAN) 5 MG tablet Take 5 mg by mouth 2 (two) times daily.   Yes Historical Provider, MD  traZODone (DESYREL) 100 MG tablet Take 100 mg by mouth 2 (two) times daily. Takes with gabapentin.   Yes Historical Provider, MD  VENTOLIN HFA 108 (90 Base) MCG/ACT inhaler Inhale 2 puffs into the lungs every 6 (six) hours as needed.  12/13/15   Historical Provider, MD   Dg Chest 1 View  Result Date: 10/01/2016 CLINICAL DATA:  Acute onset of generalized weakness. Initial encounter. EXAM: CHEST 1 VIEW COMPARISON:  Chest radiographs performed 08/05/2016 and 12/11/2015 FINDINGS: Enlarging dense right hilar opacity is suspicious for malignancy. The left lung appears relatively clear. No pleural effusion or pneumothorax is seen. There is elevation of the right hemidiaphragm. The cardiomediastinal silhouette is normal in size. No acute osseous abnormalities are identified. IMPRESSION: Enlarging dense right hilar opacity is suspicious for malignancy. CT of the chest with contrast is recommended for further evaluation. Electronically Signed   By: Garald Balding M.D.   On: 10/01/2016 17:35   Dg Lumbar Spine 2-3 Views  Result Date: 10/02/2016 CLINICAL DATA:  Back pain and weakness for 2 days. EXAM: LUMBAR SPINE - 2-3 VIEW COMPARISON:  CT scan October 01, 2016 FINDINGS: Scalloping of the L4 superior endplate was better assessed on yesterday's CT scan and is unchanged. Degenerative changes in the lower thoracic spine. No other interval changes or bony abnormalities. IMPRESSION: No change since yesterday's CT scan. Continued scalloping of the L4 superior endplate. Electronically Signed   By: Dorise Bullion  III M.D   On: 10/02/2016 17:33   Ct Head Wo Contrast  Result Date: 10/01/2016 CLINICAL DATA:  Weakness for 2 weeks EXAM: CT HEAD WITHOUT CONTRAST TECHNIQUE: Contiguous axial images were obtained from the base of the skull through the vertex without intravenous contrast. COMPARISON:  None. FINDINGS: Brain: No evidence of acute infarction, hemorrhage, extra-axial collection, ventriculomegaly, or mass effect. Generalized cerebral atrophy. Periventricular white matter low attenuation likely secondary to microangiopathy. Vascular: Cerebrovascular atherosclerotic calcifications are noted. Skull: Negative for fracture or focal lesion. Sinuses/Orbits: Visualized portions of the orbits are unremarkable. Visualized portions of the paranasal sinuses and mastoid air cells are unremarkable. Other: None. IMPRESSION: 1. No acute intracranial pathology. 2. Chronic microvascular disease  and cerebral atrophy. Electronically Signed   By: Kathreen Devoid   On: 10/01/2016 17:24   Ct Renal Stone Study  Result Date: 10/01/2016 CLINICAL DATA:  Flank pain and hematuria. EXAM: CT ABDOMEN AND PELVIS WITHOUT CONTRAST TECHNIQUE: Multidetector CT imaging of the abdomen and pelvis was performed following the standard protocol without IV contrast. COMPARISON:  None. FINDINGS: Lower chest:  No contributory findings. Hepatobiliary: The liver is nearly replaced by innumerable masses.Cholelithiasis. No evidence of acute cholecystitis. Pancreas: The head and neck is indistinguishable from bulky retroperitoneal adenopathy. Spleen: Unremarkable. Adrenals/Urinary Tract: Negative adrenals. 4 cm mass exophytic from the upper pole left kidney. Small renal calculi may be present. No hydronephrosis or ureteral calculus (ovarian vein phlebolith noted on the right). 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 Asymmetric bladder wall thickening to the left. Stomach/Bowel:  No obstruction. No inflammatory changes. Vascular/Lymphatic: Bulky adenopathy in the upper abdomen  with confluent nodal mass extending from the gastrohepatic ligament to the hepatic hilum and measuring up to 10 cm. Retroperitoneal adenopathy continues inferiorly to the level of the aortic bifurcation. Atherosclerotic calcification Reproductive:No pathologic findings. Other: No ascites or pneumoperitoneum. Musculoskeletal: Heterogeneous density of marrow without discrete destructive lesion. There is a L4 superior endplate with chronic appearance based on overlying vacuum phenomenon. Height loss is mild. Bone metastases may be present. IMPRESSION: 1. Extensive metastatic disease in the liver and retroperitoneal lymph nodes. Patient has history of breast cancer and there is a 4 cm left renal mass compatible with renal cell carcinoma. 2. Asymmetric left bladder wall thickening. 3. L4 superior endplate fracture, with nonacute appearance. This may be pathologic. Electronically Signed   By: Monte Fantasia M.D.   On: 10/01/2016 17:57    Positive ROS: All other systems have been reviewed and were otherwise negative with the exception of those mentioned in the HPI and as above.  Physical Exam: General: Alert, no acute distress Cardiovascular: No pedal edema Respiratory: No cyanosis, no use of accessory musculature GI: No organomegaly, abdomen is soft and non-tender Skin: No lesions in the area of chief complaint Neurologic: Sensation intact distally Psychiatric: Patient is competent for consent with normal mood and affect Lymphatic: No axillary or cervical lymphadenopathy  MUSCULOSKELETAL: Alert and cooperative.  She is very tender in the lower lumbar region.  The hips have good range of motion without pain.  Neurologic exam is good distally.  Skin is intact.  Assessment: CT and x-rays show a superior endplate fracture at L4. This needs to be evaluated by MRI scan to assess possibility of tumor and potential for kyphoplasty  Plan: MRI lumbar spine. Consult Dr. Hessie Knows after MRI for possibility  of kyphoplasty    Park Breed, MD [phone]   10/03/2016 12:23 PM

## 2016-10-03 NOTE — Progress Notes (Signed)
[place] CONSULT NOTE  Patient Care Team: Donnie Coffin, MD as PCP - General (Family Medicine) Minna Merritts, MD as Consulting Physician (Cardiology)  CHIEF COMPLAINTS/PURPOSE OF CONSULTATION: Possible metastatic disease  CC: Continues to complain of generalized weakness. Denies any pain. No nausea no vomiting. No diarrhea. No fevers or chills.  ROS: A complete 10 point review of system is done which is negative except mentioned above in history of present illness  MEDICAL HISTORY:  Past Medical History:  Diagnosis Date  . Atrial flutter ([place])    a. Dx 11/2015 -->CHA2DS2VASc = 7-->Eliquis '5mg'$  BID.  Marland Kitchen Breast cancer ([place])   . Chronic diastolic CHF (congestive heart failure) ([place])    a. 11/2015 Echo: EF 60-65%, no rwma, mild MR, mildly dil LA, nl RV fxn.  . Chronic Dyspnea on Exertion    a. 2004 Stress test prior to LE bypass->reportedly nl.  . Diabetes mellitus without complication ([place])    a. Dx as borderline diabetic in the 80's-->progressed over the years.  . Essential hypertension   . Lower extremity edema   . Morbid obesity ([place])   . PVC's (premature ventricular contractions)   . PVD (peripheral vascular disease) ([place])    a. 2004 s/p RLE bypass - complicated by recurrent infection.  . Stroke [facility])    a. TIA x 2;  b. Stroke in 2007 in [place].    SURGICAL HISTORY: Past Surgical History:  Procedure Laterality Date  . Cataract Surgery Right    03/2014  . CESAREAN SECTION    . ELECTROPHYSIOLOGIC STUDY N/A 08/09/2016   Procedure: CARDIOVERSION;  Surgeon: Wellington Hampshire, MD;  Location: [facility] ORS;  Service: Cardiovascular;  Laterality: N/A;  . LEG SURGERY    . MASTECTOMY Left   . 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 Hx of smoking quit 20 years ago. Patient used to live in [place]. Currently lives in [place] with her daughter.   Social History   Social History  . Marital status:  Widowed    Spouse name: N/A  . Number of children: N/A  . Years of education: N/A   Occupational History  . Not on file.   Social History Main Topics  . Smoking status: Former Research scientist (life sciences)  . Smokeless tobacco: Never Used  . Alcohol use No  . Drug use: No  . Sexual activity: Not on file   Other Topics Concern  . Not on file   Social History Narrative   Lives in [place] by herself.  Originally from [place], [place], later moved to [place] and then [place].  Has been in [place] for 8 yrs.  Family nearby.  Does not routinely exercise.   Has a cane and a walker    FAMILY HISTORY: Family History  Problem Relation Age of Onset  . Heart attack Father     died in his early 47's.  . Diabetes Father   . Cancer Mother     deceased  . Colon cancer Mother   . Cancer Brother     multiple cancers, deceased.  . Leukemia Brother   . Liver cancer Brother   . Pancreatic cancer Brother     ALLERGIES:  has No Known Allergies.  MEDICATIONS:  Current Facility-Administered Medications  Medication Dose Route Frequency Provider Last Rate Last Dose  . 0.9 %  sodium chloride infusion   Intravenous Continuous Epifanio Lesches, MD 50 mL/hr at 10/02/16 1901    .  acetaminophen (TYLENOL) tablet 650 mg  650 mg Oral Q6H PRN Epifanio Lesches, MD       Or  . acetaminophen (TYLENOL) suppository 650 mg  650 mg Rectal Q6H PRN Epifanio Lesches, MD      . albuterol (PROVENTIL) (2.5 MG/3ML) 0.083% nebulizer solution 2.5 mg  2.5 mg Inhalation Q4H PRN Epifanio Lesches, MD      . atorvastatin (LIPITOR) tablet 80 mg  80 mg Oral q1800 Epifanio Lesches, MD   80 mg at 10/02/16 1729  . bisacodyl (DULCOLAX) EC tablet 5 mg  5 mg Oral Daily PRN Epifanio Lesches, MD      . cefTRIAXone (ROCEPHIN) IVPB 1 g  1 g Intravenous Q24H Epifanio Lesches, MD   1 g at 10/02/16 1728  . diltiazem (CARDIZEM CD) 24 hr capsule 120 mg  120 mg Oral Daily Epifanio Lesches, MD   120 mg at 10/03/16 1000  . docusate sodium (COLACE) capsule  100 mg  100 mg Oral BID Epifanio Lesches, MD   100 mg at 10/03/16 1001  . feeding supplement (ENSURE ENLIVE) (ENSURE ENLIVE) liquid 237 mL  237 mL Oral BID BM Vaughan Basta, MD   237 mL at 10/03/16 1010  . furosemide (LASIX) tablet 40 mg  40 mg Oral BID Epifanio Lesches, MD   40 mg at 10/03/16 1000  . gabapentin (NEURONTIN) capsule 100 mg  100 mg Oral BID Alexis Hugelmeyer, DO   100 mg at 10/03/16 1010  . heparin injection 5,000 Units  5,000 Units Subcutaneous Q8H Epifanio Lesches, MD   5,000 Units at 10/03/16 0609  . HYDROcodone-acetaminophen (NORCO/VICODIN) 5-325 MG per tablet 1-2 tablet  1-2 tablet Oral Q4H PRN Epifanio Lesches, MD      . insulin aspart (novoLOG) injection 0-9 Units  0-9 Units Subcutaneous TID WC Vaughan Basta, MD   5 Units at 10/02/16 1729  . insulin detemir (LEVEMIR) injection 5 Units  5 Units Subcutaneous QHS Epifanio Lesches, MD   5 Units at 10/02/16 2205  . lisinopril (PRINIVIL,ZESTRIL) tablet 10 mg  10 mg Oral Daily Epifanio Lesches, MD   10 mg at 10/02/16 1049  . metFORMIN (GLUCOPHAGE) tablet 1,000 mg  1,000 mg Oral BID WC Epifanio Lesches, MD   1,000 mg at 10/03/16 1000  . metoprolol tartrate (LOPRESSOR) tablet 25 mg  25 mg Oral BID Epifanio Lesches, MD   25 mg at 10/02/16 2206  . multivitamin with minerals tablet 1 tablet  1 tablet Oral Daily Epifanio Lesches, MD   1 tablet at 10/03/16 1000  . ondansetron (ZOFRAN) tablet 4 mg  4 mg Oral Q6H PRN Epifanio Lesches, MD       Or  . ondansetron (ZOFRAN) injection 4 mg  4 mg Intravenous Q6H PRN Epifanio Lesches, MD   4 mg at 10/02/16 1314  . oxybutynin (DITROPAN) tablet 5 mg  5 mg Oral BID Epifanio Lesches, MD   5 mg at 10/03/16 1000  . pantoprazole (PROTONIX) EC tablet 40 mg  40 mg Oral Daily Epifanio Lesches, MD   40 mg at 10/03/16 1000  . [START ON 10/04/2016] predniSONE (DELTASONE) tablet 1 mg  1 mg Oral Q breakfast Vaughan Basta, MD      . traZODone (DESYREL)  tablet 100 mg  100 mg Oral BID Epifanio Lesches, MD   100 mg at 10/03/16 1000  . traZODone (DESYREL) tablet 25 mg  25 mg Oral QHS PRN Epifanio Lesches, MD          .  PHYSICAL EXAMINATION:  Vitals:  10/03/16 1000 10/03/16 1003  BP: (!) 115/51 (!) 115/51  Pulse:  88  Resp:    Temp:  97.9 F (36.6 C)   Filed Weights   10/01/16 2038 10/02/16 0500 10/03/16 0500  Weight: 163 lb 14.4 oz (74.3 kg) 163 lb 14.4 oz (74.3 kg) 166 lb 12.8 oz (75.7 kg)    GENERAL: Well-nourished well-developed; Alert, no distress and comfortable.   Accompanied by her daughter and granddaughter. EYES: no pallor or icterus OROPHARYNX: no thrush or ulceration. Poor dentition NECK: supple, no masses felt LYMPH:  no palpable lymphadenopathy in the cervical, axillary or inguinal regions LUNGS: decreased breath sounds to auscultation at bases and  No wheeze or crackles HEART/CVS: regular rate & rhythm and no murmurs; No lower extremity edema ABDOMEN: abdomen soft, non-tender and normal bowel sounds Musculoskeletal:no cyanosis of digits and no clubbing  PSYCH: alert & oriented x 3 with fluent speech NEURO: no focal motor/sensory deficits SKIN:  no rashes or significant lesions  LABORATORY DATA:  I have reviewed the data as listed Lab Results  Component Value Date   WBC 9.2 10/02/2016   HGB 12.0 10/02/2016   HCT 37.3 10/02/2016   MCV 87.2 10/02/2016   PLT 228 10/02/2016    Recent Labs  08/09/16 0516 10/01/16 1408 10/02/16 0458  NA 136 135 136  K 4.1 4.0 3.7  CL 93* 91* 94*  CO2 33* 31 32  GLUCOSE 155* 385* 361*  BUN 33* 38* 37*  CREATININE 1.28* 1.14* 1.08*  CALCIUM 9.4 9.0 8.6*  GFRNONAA 44* 50* 54*  GFRAA 51* 58* >60    RADIOGRAPHIC STUDIES: I have personally reviewed the radiological images as listed and agreed with the findings in the report. Dg Chest 1 View  Result Date: 10/01/2016 CLINICAL DATA:  Acute onset of generalized weakness. Initial encounter. EXAM: CHEST 1 VIEW  COMPARISON:  Chest radiographs performed 08/05/2016 and 12/11/2015 FINDINGS: Enlarging dense right hilar opacity is suspicious for malignancy. The left lung appears relatively clear. No pleural effusion or pneumothorax is seen. There is elevation of the right hemidiaphragm. The cardiomediastinal silhouette is normal in size. No acute osseous abnormalities are identified. IMPRESSION: Enlarging dense right hilar opacity is suspicious for malignancy. CT of the chest with contrast is recommended for further evaluation. Electronically Signed   By: Garald Balding M.D.   On: 10/01/2016 17:35   Dg Lumbar Spine 2-3 Views  Result Date: 10/02/2016 CLINICAL DATA:  Back pain and weakness for 2 days. EXAM: LUMBAR SPINE - 2-3 VIEW COMPARISON:  CT scan October 01, 2016 FINDINGS: Scalloping of the L4 superior endplate was better assessed on yesterday's CT scan and is unchanged. Degenerative changes in the lower thoracic spine. No other interval changes or bony abnormalities. IMPRESSION: No change since yesterday's CT scan. Continued scalloping of the L4 superior endplate. Electronically Signed   By: Dorise Bullion III M.D   On: 10/02/2016 17:33   Ct Head Wo Contrast  Result Date: 10/01/2016 CLINICAL DATA:  Weakness for 2 weeks EXAM: CT HEAD WITHOUT CONTRAST TECHNIQUE: Contiguous axial images were obtained from the base of the skull through the vertex without intravenous contrast. COMPARISON:  None. FINDINGS: Brain: No evidence of acute infarction, hemorrhage, extra-axial collection, ventriculomegaly, or mass effect. Generalized cerebral atrophy. Periventricular white matter low attenuation likely secondary to microangiopathy. Vascular: Cerebrovascular atherosclerotic calcifications are noted. Skull: Negative for fracture or focal lesion. Sinuses/Orbits: Visualized portions of the orbits are unremarkable. Visualized portions of the paranasal sinuses and mastoid air cells are unremarkable. Other:  None. IMPRESSION: 1. No acute  intracranial pathology. 2. Chronic microvascular disease and cerebral atrophy. Electronically Signed   By: Kathreen Devoid   On: 10/01/2016 17:24   Ct Renal Stone Study  Result Date: 10/01/2016 CLINICAL DATA:  Flank pain and hematuria. EXAM: CT ABDOMEN AND PELVIS WITHOUT CONTRAST TECHNIQUE: Multidetector CT imaging of the abdomen and pelvis was performed following the standard protocol without IV contrast. COMPARISON:  None. FINDINGS: Lower chest:  No contributory findings. Hepatobiliary: The liver is nearly replaced by innumerable masses.Cholelithiasis. No evidence of acute cholecystitis. Pancreas: The head and neck is indistinguishable from bulky retroperitoneal adenopathy. Spleen: Unremarkable. Adrenals/Urinary Tract: Negative adrenals. 4 cm mass exophytic from the upper pole left kidney. Small renal calculi may be present. No hydronephrosis or ureteral calculus (ovarian vein phlebolith noted on the right). 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 Asymmetric bladder wall thickening to the left. Stomach/Bowel:  No obstruction. No inflammatory changes. Vascular/Lymphatic: Bulky adenopathy in the upper abdomen with confluent nodal mass extending from the gastrohepatic ligament to the hepatic hilum and measuring up to 10 cm. Retroperitoneal adenopathy continues inferiorly to the level of the aortic bifurcation. Atherosclerotic calcification Reproductive:No pathologic findings. Other: No ascites or pneumoperitoneum. Musculoskeletal: Heterogeneous density of marrow without discrete destructive lesion. There is a L4 superior endplate with chronic appearance based on overlying vacuum phenomenon. Height loss is mild. Bone metastases may be present. IMPRESSION: 1. Extensive metastatic disease in the liver and retroperitoneal lymph nodes. Patient has history of breast cancer and there is a 4 cm left renal mass compatible with renal cell carcinoma. 2. Asymmetric left bladder wall thickening. 3. L4 superior endplate fracture, with  nonacute appearance. This may be pathologic. Electronically Signed   By: Monte Fantasia M.D.   On: 10/01/2016 17:57    ASSESSMENT & PLAN:  # 61 year old female patient currently admitted to the hospital for generalized weakness/UTI. Imaging shows concerns for metastatic disease  # Kidney mass approximately 4 cm; Multiple liver lesions;  with retroperitoneal adenopathy- highly concerning for metastatic disease. Question kidney primary versus breast versus others. Patient had a prior history of breast cancer 20 years ago. Patient will need a biopsy of the most accessible lesion likely liver. We'll order CT of the chest and pelvis with contrast for further evaluation.  # I would recommend holding the Eliquis in anticipation of the need for biopsy.  # Multiple comorbidities including- history of stroke/TIA; atrial flutter on Eliquis; CHF; history of peripheral vascular disease.  # I reviewed the blood work- with the patient in detail; also reviewed the imaging independently [as summarized above]; and with the patient in detail. Discussed with Dr.Vachhani.    Cammie Sickle, MD 10/03/2016 11:31 AM

## 2016-10-03 NOTE — Progress Notes (Signed)
[facility] NAME: Megan Cisneros    MR#:  [id number]  DATE OF BIRTH:  March 13, 1956  SUBJECTIVE:  CHIEF COMPLAINT:   Chief Complaint  Patient presents with  . Weakness     Weight loss and progressive weakness for last 1-2 months.  Found to have UTI. On CT abd- have new findings of renal mass, metastasis to liver and retroperitoneal nodes.   Feels weak, but no other complains, tolerating diet, but not eating much.  REVIEW OF SYSTEMS:  CONSTITUTIONAL: No fever,positive for fatigue or weakness.  EYES: No blurred or double vision.  EARS, NOSE, AND THROAT: No tinnitus or ear pain.  RESPIRATORY: No cough, shortness of breath, wheezing or hemoptysis.  CARDIOVASCULAR: No chest pain, orthopnea, edema.  GASTROINTESTINAL: No nausea, vomiting, diarrhea or abdominal pain.  GENITOURINARY: No dysuria, hematuria.  ENDOCRINE: No polyuria, nocturia,  HEMATOLOGY: No anemia, easy bruising or bleeding SKIN: No rash or lesion. MUSCULOSKELETAL: No joint pain or arthritis.   NEUROLOGIC: No tingling, numbness, weakness.  PSYCHIATRY: No anxiety or depression.   ROS  DRUG ALLERGIES:  No Known Allergies  VITALS:  Blood pressure (!) 125/45, pulse 77, temperature 97.8 F (36.6 C), temperature source Oral, resp. rate (!) 21, height 5' (1.524 m), weight 75.7 kg (166 lb 12.8 oz), SpO2 90 %.  PHYSICAL EXAMINATION:  GENERAL:  61 y.o.-year-old patient lying in the bed with no acute distress.  EYES: Pupils equal, round, reactive to light and accommodation. No scleral icterus. Extraocular muscles intact.  HEENT: Head atraumatic, normocephalic. Oropharynx and nasopharynx clear.  NECK:  Supple, no jugular venous distention. No thyroid enlargement, no tenderness.  LUNGS: Normal breath sounds bilaterally, no wheezing, rales,rhonchi or crepitation. No use of accessory muscles of respiration.  CARDIOVASCULAR: S1, S2 normal. No murmurs, rubs, or gallops.  ABDOMEN:  Soft, nontender, nondistended. Bowel sounds present. No organomegaly or mass.  EXTREMITIES: No pedal edema, cyanosis, or clubbing.  NEUROLOGIC: Cranial nerves II through XII are intact. Muscle strength 4/5 in all extremities. Sensation intact. Gait not checked.  PSYCHIATRIC: The patient is alert and oriented x 3.  SKIN: No obvious rash, lesion, or ulcer.   Physical Exam LABORATORY PANEL:   CBC  Recent Labs Lab 10/02/16 0458  WBC 9.2  HGB 12.0  HCT 37.3  PLT 228   ------------------------------------------------------------------------------------------------------------------  Chemistries   Recent Labs Lab 10/02/16 0458  NA 136  K 3.7  CL 94*  CO2 32  GLUCOSE 361*  BUN 37*  CREATININE 1.08*  CALCIUM 8.6*   ------------------------------------------------------------------------------------------------------------------  Cardiac Enzymes  Recent Labs Lab 10/01/16 1408  TROPONINI 0.04*   ------------------------------------------------------------------------------------------------------------------  RADIOLOGY:  Dg Chest 1 View  Result Date: 10/01/2016 CLINICAL DATA:  Acute onset of generalized weakness. Initial encounter. EXAM: CHEST 1 VIEW COMPARISON:  Chest radiographs performed 08/05/2016 and 12/11/2015 FINDINGS: Enlarging dense right hilar opacity is suspicious for malignancy. The left lung appears relatively clear. No pleural effusion or pneumothorax is seen. There is elevation of the right hemidiaphragm. The cardiomediastinal silhouette is normal in size. No acute osseous abnormalities are identified. IMPRESSION: Enlarging dense right hilar opacity is suspicious for malignancy. CT of the chest with contrast is recommended for further evaluation. Electronically Signed   By: Garald Balding M.D.   On: 10/01/2016 17:35   Dg Lumbar Spine 2-3 Views  Result Date: 10/02/2016 CLINICAL DATA:  Back pain and weakness for 2 days. EXAM: LUMBAR SPINE - 2-3 VIEW COMPARISON:   CT scan October 01, 2016  FINDINGS: Scalloping of the L4 superior endplate was better assessed on yesterday's CT scan and is unchanged. Degenerative changes in the lower thoracic spine. No other interval changes or bony abnormalities. IMPRESSION: No change since yesterday's CT scan. Continued scalloping of the L4 superior endplate. Electronically Signed   By: Dorise Bullion III M.D   On: 10/02/2016 17:33   Ct Head Wo Contrast  Result Date: 10/01/2016 CLINICAL DATA:  Weakness for 2 weeks EXAM: CT HEAD WITHOUT CONTRAST TECHNIQUE: Contiguous axial images were obtained from the base of the skull through the vertex without intravenous contrast. COMPARISON:  None. FINDINGS: Brain: No evidence of acute infarction, hemorrhage, extra-axial collection, ventriculomegaly, or mass effect. Generalized cerebral atrophy. Periventricular white matter low attenuation likely secondary to microangiopathy. Vascular: Cerebrovascular atherosclerotic calcifications are noted. Skull: Negative for fracture or focal lesion. Sinuses/Orbits: Visualized portions of the orbits are unremarkable. Visualized portions of the paranasal sinuses and mastoid air cells are unremarkable. Other: None. IMPRESSION: 1. No acute intracranial pathology. 2. Chronic microvascular disease and cerebral atrophy. Electronically Signed   By: Kathreen Devoid   On: 10/01/2016 17:24   Ct Renal Stone Study  Result Date: 10/01/2016 CLINICAL DATA:  Flank pain and hematuria. EXAM: CT ABDOMEN AND PELVIS WITHOUT CONTRAST TECHNIQUE: Multidetector CT imaging of the abdomen and pelvis was performed following the standard protocol without IV contrast. COMPARISON:  None. FINDINGS: Lower chest:  No contributory findings. Hepatobiliary: The liver is nearly replaced by innumerable masses.Cholelithiasis. No evidence of acute cholecystitis. Pancreas: The head and neck is indistinguishable from bulky retroperitoneal adenopathy. Spleen: Unremarkable. Adrenals/Urinary Tract: Negative  adrenals. 4 cm mass exophytic from the upper pole left kidney. Small renal calculi may be present. No hydronephrosis or ureteral calculus (ovarian vein phlebolith noted on the right). 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 Asymmetric bladder wall thickening to the left. Stomach/Bowel:  No obstruction. No inflammatory changes. Vascular/Lymphatic: Bulky adenopathy in the upper abdomen with confluent nodal mass extending from the gastrohepatic ligament to the hepatic hilum and measuring up to 10 cm. Retroperitoneal adenopathy continues inferiorly to the level of the aortic bifurcation. Atherosclerotic calcification Reproductive:No pathologic findings. Other: No ascites or pneumoperitoneum. Musculoskeletal: Heterogeneous density of marrow without discrete destructive lesion. There is a L4 superior endplate with chronic appearance based on overlying vacuum phenomenon. Height loss is mild. Bone metastases may be present. IMPRESSION: 1. Extensive metastatic disease in the liver and retroperitoneal lymph nodes. Patient has history of breast cancer and there is a 4 cm left renal mass compatible with renal cell carcinoma. 2. Asymmetric left bladder wall thickening. 3. L4 superior endplate fracture, with nonacute appearance. This may be pathologic. Electronically Signed   By: Monte Fantasia M.D.   On: 10/01/2016 17:57    ASSESSMENT AND PLAN:   Active Problems:   Metastatic lung cancer (metastasis from lung to other site) ([place])   Pressure injury of skin   #16.61 year old female patient with generalized weakness and found to have extensive metastatic disease in the liver, retroperitoneal nodes, left renal mass, possible right lung cancer Onco consult appreciated , may need biopsy.   Requested US guided biopsy.  #2 UTI: Started on IV Rocephin, follow urine cultures.Pending. #3  for newly diagnosed metastatic cancer patient - Follow oncology recommendations. #4.L4 superior endplate fracture, with nonacute appearance.  This may be pathologic; will get ortho consult.  physical therapy consult. #5 Hx of A fib with Ch diastolic CHF   Currently stable, cont oral meds    Hold Eliquis, as will need Biopsy per  oncology. Held since Saturday. #6 Diabetes   Cont metformin, insulin sliding scale coverage.  Pending PT eval for discharge plan.  All the records are reviewed and case discussed with Care Management/Social Workerr. Management plans discussed with the patient, family and they are in agreement.  CODE STATUS: Full.  TOTAL TIME TAKING CARE OF THIS PATIENT: 35 minutes.     POSSIBLE D/C IN 1-2 DAYS, DEPENDING ON CLINICAL CONDITION.   Vaughan Basta M.D on 10/03/2016   Between 7am to 6pm - Pager - [phone]  After 6pm go to [email or web address] - password [email or web address] [facility]  Office  [phone]  CC: Primary care physician; Donnie Coffin, MD  Note: This dictation was prepared with Dragon dictation along with smaller phrase technology. Any transcriptional errors that result from this process are unintentional.

## 2016-10-04 ENCOUNTER — Inpatient Hospital Stay: Payer: Medicare Other

## 2016-10-04 DIAGNOSIS — Z79899 Other long term (current) drug therapy: Secondary | ICD-10-CM

## 2016-10-04 DIAGNOSIS — Z794 Long term (current) use of insulin: Secondary | ICD-10-CM

## 2016-10-04 DIAGNOSIS — Z809 Family history of malignant neoplasm, unspecified: Secondary | ICD-10-CM

## 2016-10-04 DIAGNOSIS — I482 Chronic atrial fibrillation: Secondary | ICD-10-CM

## 2016-10-04 DIAGNOSIS — Z9012 Acquired absence of left breast and nipple: Secondary | ICD-10-CM

## 2016-10-04 DIAGNOSIS — Z800 Family history of malignant neoplasm of digestive organs: Secondary | ICD-10-CM

## 2016-10-04 DIAGNOSIS — R918 Other nonspecific abnormal finding of lung field: Secondary | ICD-10-CM

## 2016-10-04 DIAGNOSIS — R599 Enlarged lymph nodes, unspecified: Secondary | ICD-10-CM

## 2016-10-04 DIAGNOSIS — Z87891 Personal history of nicotine dependence: Secondary | ICD-10-CM

## 2016-10-04 LAB — GLUCOSE, CAPILLARY
GLUCOSE-CAPILLARY: 133 mg/dL — AB (ref 65–99)
GLUCOSE-CAPILLARY: 118 mg/dL — AB (ref 65–99)
GLUCOSE-CAPILLARY: 102 mg/dL — AB (ref 65–99)
Glucose-Capillary: 134 mg/dL — ABNORMAL HIGH (ref 65–99)

## 2016-10-04 LAB — PROTIME-INR
INR: 1.04
PROTHROMBIN TIME: 13.6 s (ref 11.4–15.2)

## 2016-10-04 LAB — URINE CULTURE
Culture: >=100000 — AB
SPECIAL REQUESTS: NORMAL

## 2016-10-04 LAB — CANCER ANTIGEN 27.29: CA 27.29: 240.8 U/mL — ABNORMAL HIGH (ref 0.0–38.6)

## 2016-10-04 LAB — APTT: aPTT: 31 seconds (ref 24–36)

## 2016-10-04 MED ORDER — CEPHALEXIN 500 MG PO CAPS
500.0000 mg | ORAL_CAPSULE | Freq: Two times a day (BID) | ORAL | Status: DC
  Administered 2016-10-04 – 2016-10-08 (×9): 500 mg via ORAL
  Filled 2016-10-04 (×3): qty 1
  Filled 2016-10-04: qty 2
  Filled 2016-10-04 (×2): qty 1
  Filled 2016-10-04 (×2): qty 2
  Filled 2016-10-04: qty 1

## 2016-10-04 MED ORDER — LORAZEPAM 2 MG/ML IJ SOLN
1.0000 mg | Freq: Once | INTRAMUSCULAR | Status: AC
  Administered 2016-10-04: 10:00:00 1 mg via INTRAVENOUS
  Filled 2016-10-04: qty 1

## 2016-10-04 MED ORDER — SODIUM CHLORIDE 0.9 % IV SOLN: INTRAVENOUS | Status: DC

## 2016-10-04 NOTE — Plan of Care (Signed)
Problem: Safety: Goal: Ability to remain free from injury will improve Outcome: Progressing Patient remained safe and comfortable. High fall risk measures continued. Needs attended.

## 2016-10-04 NOTE — Consult Note (Signed)
MRI and STIR images show endplate deformity at L4 is old, not acute.  Kyphoplasty not indicated unless biopsy desired.

## 2016-10-04 NOTE — Progress Notes (Signed)
[facility] [place] NAME: Megan Cisneros    MR#:  [id number]  DATE OF BIRTH:  10-Jul-1956  SUBJECTIVE:  CHIEF COMPLAINT:   Chief Complaint  Patient presents with  . Weakness     Weight loss and progressive weakness for last 1-2 months.  Found to have UTI.  On CT abd- have new findings of renal mass, metastasis to liver and retroperitoneal nodes.   Feels weak, but no other complains, tolerating diet, but not eating much.   Had MRI of spine today,a nd received Ativan for that- as a result was light drowsy when seen.  REVIEW OF SYSTEMS:  CONSTITUTIONAL: No fever,positive for fatigue or weakness.  EYES: No blurred or double vision.  EARS, NOSE, AND THROAT: No tinnitus or ear pain.  RESPIRATORY: No cough, shortness of breath, wheezing or hemoptysis.  CARDIOVASCULAR: No chest pain, orthopnea, edema.  GASTROINTESTINAL: No nausea, vomiting, diarrhea or abdominal pain.  GENITOURINARY: No dysuria, hematuria.  ENDOCRINE: No polyuria, nocturia,  HEMATOLOGY: No anemia, easy bruising or bleeding SKIN: No rash or lesion. MUSCULOSKELETAL: No joint pain or arthritis.   NEUROLOGIC: No tingling, numbness, weakness.  PSYCHIATRY: No anxiety or depression.   ROS  DRUG ALLERGIES:  No Known Allergies  VITALS:  Blood pressure 124/72, pulse (!) 114, temperature 97.5 F (36.4 C), temperature source Oral, resp. rate 14, height 5' (1.524 m), weight 76.7 kg (169 lb 1.6 oz), SpO2 96 %.  PHYSICAL EXAMINATION:  GENERAL:  61 y.o.-year-old patient lying in the bed with no acute distress.  EYES: Pupils equal, round, reactive to light and accommodation. No scleral icterus. Extraocular muscles intact.  HEENT: Head atraumatic, normocephalic. Oropharynx and nasopharynx clear.  NECK:  Supple, no jugular venous distention. No thyroid enlargement, no tenderness.  LUNGS: Normal breath sounds bilaterally, no wheezing, rales,rhonchi or crepitation. No use of accessory muscles  of respiration.  CARDIOVASCULAR: S1, S2 normal. No murmurs, rubs, or gallops.  ABDOMEN: Soft, nontender, nondistended. Bowel sounds present. No organomegaly or mass.  EXTREMITIES: No pedal edema, cyanosis, or clubbing.  NEUROLOGIC: Cranial nerves II through XII are intact. Muscle strength 4/5 in all extremities. Sensation intact. Gait not checked.  PSYCHIATRIC: The patient is alert and oriented x 3.  SKIN: No obvious rash, lesion, or ulcer.   Physical Exam LABORATORY PANEL:   CBC  Recent Labs Lab 10/02/16 0458  WBC 9.2  HGB 12.0  HCT 37.3  PLT 228   ------------------------------------------------------------------------------------------------------------------  Chemistries   Recent Labs Lab 10/02/16 0458  NA 136  K 3.7  CL 94*  CO2 32  GLUCOSE 361*  BUN 37*  CREATININE 1.08*  CALCIUM 8.6*   ------------------------------------------------------------------------------------------------------------------  Cardiac Enzymes  Recent Labs Lab 10/01/16 1408  TROPONINI 0.04*   ------------------------------------------------------------------------------------------------------------------  RADIOLOGY:  Ct Chest W Contrast  Result Date: 10/03/2016 CLINICAL DATA:  Metastatic breast cancer EXAM: CT CHEST, ABDOMEN, AND PELVIS WITH CONTRAST TECHNIQUE: Multidetector CT imaging of the chest, abdomen and pelvis was performed following the standard protocol during bolus administration of intravenous contrast. CONTRAST:  128m ISOVUE-300 IOPAMIDOL (ISOVUE-300) INJECTION 61% COMPARISON:  CT scan 10/01/2016 FINDINGS: CT CHEST FINDINGS Chest wall: Status post left mastectomy. No right-sided breast masses are identified. No axillary lymphadenopathy. 8 mm supraclavicular lymph node noted on image 2. There also enlarge subclavicular lymph nodes on the right side. The thyroid gland is grossly normal. Cardiovascular: The heart is within normal limits in size and stable. All stable dense  atherosclerotic calcifications involving the aorta and branch  vessels. No aneurysm or dissection. Three-vessel coronary artery calcifications are noted. Mediastinum/Nodes: There is extensive mediastinal and right hilar tumor. Large nodal masses throughout the mediastinum. The right paratracheal mass measures 4.8 x 3.9 cm on image 17. There is severe compression of the SVC but it is not completely occluded. Right hilar/ mediastinal mass on image number 25 measures 7.8 x 6.7 cm. Subcarinal nodal mass on image number 28 measures 34 x 30 mm. Lungs/Pleura: Lobulated right upper lobe lung mass on image number 65 measures 38 x 32 mm. Marked interstitial thickening on the right could suggest interstitial spread of tumor. The left lung is relatively clear. No acute pulmonary findings or worrisome pulmonary lesions. There are small bilateral pleural effusions. Musculoskeletal: Mixed lytic and sclerotic sternal lesions are suspicious for metastatic disease. No other obvious bone lesions. CT ABDOMEN PELVIS FINDINGS Hepatobiliary: Index lesion in the right lobe on image number 53 measures 8.6 x 4.7 cm. Index lesion in the lateral segment of the left hepatic lobe on image number 52 measures 5.4 x 4.1 cm. The gallbladder is grossly normal. A stable calcified gallstone is noted. Small amount of pericholecystic fluid. Pancreas: No mass, inflammation or ductal dilatation. Spleen: Normal size.  No focal lesions. Adrenals/Urinary Tract: Left adrenal gland lesions suspicious for metastasis. The right adrenal gland is normal. Complex enhancing left upper pole renal lesion measures 5.9 x 4.2 cm and is consistent with renal cell carcinoma. No renal vein invasion. Stomach/Bowel: The stomach, duodenum, small bowel and colon are grossly normal. No inflammatory changes, mass lesions or obstructive findings. Vascular/Lymphatic: Bulky knee upper abdominal lymphadenopathy with a confluent mass in the portal region, gastrohepatic ligament and  celiac axis. This measures 10.2 x 6.5 cm on image number 55. There is compression of the extrahepatic portal vein. There is also bulky retroperitoneal lymphadenopathy bilaterally. Advanced atherosclerotic calcifications involving the aorta and branch vessels. No aneurysm or dissection. Reproductive: The uterus and ovaries are unremarkable. Other: No pelvic mass or adenopathy. No free pelvic fluid collections. Moderate distention of the bladder. No inguinal mass or adenopathy. Musculoskeletal: No definite lytic or sclerotic bone lesions to suggest metastasis. IMPRESSION: 1. Bulky mediastinal and right hilar lymphadenopathy. Imminent SVC occlusion. 2. Large right upper lobe lung mass and surrounding interstitial thickening suggesting interstitial spread of tumor. 3. Diffuse hepatic metastatic disease and bulky upper abdominal and retroperitoneal lymphadenopathy. 4. Complex enhancing partially calcified left upper pole renal mass most likely renal cell carcinoma. 5. Suspect sternal metastatic disease. I do not see any other obvious bone lesions. 6. Advanced atherosclerotic calcifications involving the thoracic and abdominal aorta and branch vessels. Electronically Signed   By: Marijo Sanes M.D.   On: 10/03/2016 16:26   Mr Lumbar Spine Wo Contrast  Result Date: 10/04/2016 CLINICAL DATA:  Acute on chronic low back pain with a recent fall. Recently diagnosed metastatic disease. L4 compression fracture. EXAM: MRI LUMBAR SPINE WITHOUT CONTRAST TECHNIQUE: Multiplanar, multisequence MR imaging of the lumbar spine was performed. No intravenous contrast was administered. COMPARISON:  CT abdomen and pelvis 10/03/2016 FINDINGS: Segmentation:  Standard. Alignment:  Slight anterolisthesis of L4 on L5 due to facet disease. Vertebrae: L4 superior endplate fracture with 36% height loss centrally. No substantial marrow edema is identified directly subjacent to the compressed portion of the endplate. There is some hyperintense STIR  signal more posteriorly along the superior endplate and in the right aspect of the vertebral body, however this is similar in appearance to multiple other levels and is felt to reflect an underlying  osseous lesion rather than edema from an acute fracture. Bone marrow signal is diffusely abnormal throughout the visualized lower thoracic and lumbar spine as well as sacrum with replacement of the majority of normal fatty marrow signal and numerous STIR hyperintense lesions throughout. No gross paraspinal or epidural tumor is identified on this unenhanced study. Conus medullaris: Extends to the L1 level and appears normal. Paraspinal and other soft tissues: Liver metastases and retroperitoneal lymphadenopathy as described on recent CT. Disc levels: T12-L1:  Minimal disc bulging without stenosis. L1-2:  Negative. L2-3:  Negative. L3-4: Disc bulging, shallow central disc protrusion, and ligamentum flavum thickening result in mild right greater than left lateral recess stenosis, mild to moderate spinal stenosis, and no significant neural foraminal stenosis. L4-5: Disc bulging, shallow left paracentral disc protrusion, ligamentum flavum thickening, and facet arthrosis result in moderate spinal stenosis, mild right and moderate left lateral recess stenosis, and mild left neural foraminal stenosis. There are small facet joint effusions, right larger than left. L5-S1: Right paracentral disc protrusion results in minimal right lateral recess narrowing and slight posterior deflection of the adjacent right S1 nerve root. No spinal or neural foraminal stenosis. Mild facet arthrosis. IMPRESSION: 1. Diffuse osseous metastatic disease. 2. L4 superior endplate fracture with mild height loss favored to be chronic. 3. Moderate spinal stenosis and left greater than right lateral recess stenosis at L4-5. 4. Mild-to-moderate spinal stenosis at L3-4. Electronically Signed   By: Logan Bores M.D.   On: 10/04/2016 12:04   Ct Abdomen Pelvis  W Contrast  Result Date: 10/03/2016 CLINICAL DATA:  Metastatic breast cancer EXAM: CT CHEST, ABDOMEN, AND PELVIS WITH CONTRAST TECHNIQUE: Multidetector CT imaging of the chest, abdomen and pelvis was performed following the standard protocol during bolus administration of intravenous contrast. CONTRAST:  162m ISOVUE-300 IOPAMIDOL (ISOVUE-300) INJECTION 61% COMPARISON:  CT scan 10/01/2016 FINDINGS: CT CHEST FINDINGS Chest wall: Status post left mastectomy. No right-sided breast masses are identified. No axillary lymphadenopathy. 8 mm supraclavicular lymph node noted on image 2. There also enlarge subclavicular lymph nodes on the right side. The thyroid gland is grossly normal. Cardiovascular: The heart is within normal limits in size and stable. All stable dense atherosclerotic calcifications involving the aorta and branch vessels. No aneurysm or dissection. Three-vessel coronary artery calcifications are noted. Mediastinum/Nodes: There is extensive mediastinal and right hilar tumor. Large nodal masses throughout the mediastinum. The right paratracheal mass measures 4.8 x 3.9 cm on image 17. There is severe compression of the SVC but it is not completely occluded. Right hilar/ mediastinal mass on image number 25 measures 7.8 x 6.7 cm. Subcarinal nodal mass on image number 28 measures 34 x 30 mm. Lungs/Pleura: Lobulated right upper lobe lung mass on image number 65 measures 38 x 32 mm. Marked interstitial thickening on the right could suggest interstitial spread of tumor. The left lung is relatively clear. No acute pulmonary findings or worrisome pulmonary lesions. There are small bilateral pleural effusions. Musculoskeletal: Mixed lytic and sclerotic sternal lesions are suspicious for metastatic disease. No other obvious bone lesions. CT ABDOMEN PELVIS FINDINGS Hepatobiliary: Index lesion in the right lobe on image number 53 measures 8.6 x 4.7 cm. Index lesion in the lateral segment of the left hepatic lobe on  image number 52 measures 5.4 x 4.1 cm. The gallbladder is grossly normal. A stable calcified gallstone is noted. Small amount of pericholecystic fluid. Pancreas: No mass, inflammation or ductal dilatation. Spleen: Normal size.  No focal lesions. Adrenals/Urinary Tract: Left adrenal gland lesions suspicious  for metastasis. The right adrenal gland is normal. Complex enhancing left upper pole renal lesion measures 5.9 x 4.2 cm and is consistent with renal cell carcinoma. No renal vein invasion. Stomach/Bowel: The stomach, duodenum, small bowel and colon are grossly normal. No inflammatory changes, mass lesions or obstructive findings. Vascular/Lymphatic: Bulky knee upper abdominal lymphadenopathy with a confluent mass in the portal region, gastrohepatic ligament and celiac axis. This measures 10.2 x 6.5 cm on image number 55. There is compression of the extrahepatic portal vein. There is also bulky retroperitoneal lymphadenopathy bilaterally. Advanced atherosclerotic calcifications involving the aorta and branch vessels. No aneurysm or dissection. Reproductive: The uterus and ovaries are unremarkable. Other: No pelvic mass or adenopathy. No free pelvic fluid collections. Moderate distention of the bladder. No inguinal mass or adenopathy. Musculoskeletal: No definite lytic or sclerotic bone lesions to suggest metastasis. IMPRESSION: 1. Bulky mediastinal and right hilar lymphadenopathy. Imminent SVC occlusion. 2. Large right upper lobe lung mass and surrounding interstitial thickening suggesting interstitial spread of tumor. 3. Diffuse hepatic metastatic disease and bulky upper abdominal and retroperitoneal lymphadenopathy. 4. Complex enhancing partially calcified left upper pole renal mass most likely renal cell carcinoma. 5. Suspect sternal metastatic disease. I do not see any other obvious bone lesions. 6. Advanced atherosclerotic calcifications involving the thoracic and abdominal aorta and branch vessels.  Electronically Signed   By: Marijo Sanes M.D.   On: 10/03/2016 16:26    ASSESSMENT AND PLAN:   Active Problems:   Metastatic lung cancer (metastasis from lung to other site) ([place])   Pressure injury of skin   #29.61 year old female patient with generalized weakness and found to have extensive metastatic disease in the liver, retroperitoneal nodes, left renal mass, possible right lung cancer Onco consult appreciated , may need biopsy.   Requested US guided biopsy. Awaited today.  #2 UTI: Started on IV Rocephin, As per Urine culture- > 100,000 staphylococcus.   Change to oral keflex. #3  for newly diagnosed metastatic cancer patient - Follow oncology recommendations.   Need US guided biopsy of liver. #4.L4 superior endplate fracture, with nonacute appearance. This may be pathologic; Ortho consult.  physical therapy consult. #5 Hx of A fib with Ch diastolic CHF   Currently stable, cont oral meds    Hold Eliquis, as will need Biopsy per oncology. Held since Saturday. #6 Diabetes   on metformin, insulin sliding scale coverage.   Hold metformin until 10/05/16 due to CT with contrast.  PT suggest SNF.  All the records are reviewed and case discussed with Care Management/Social Workerr. Management plans discussed with the patient, family and they are in agreement.  CODE STATUS: Full.  TOTAL TIME TAKING CARE OF THIS PATIENT: 35 minutes.     POSSIBLE D/C IN 1-2 DAYS, DEPENDING ON CLINICAL CONDITION.   Vaughan Basta M.D on 10/04/2016   Between 7am to 6pm - Pager - [phone]  After 6pm go to [email or web address] - password [email or web address] [facility]  Office  [phone]  CC: Primary care physician; Donnie Coffin, MD  Note: This dictation was prepared with Dragon dictation along with smaller phrase technology. Any transcriptional errors that result from this process are unintentional.

## 2016-10-04 NOTE — Progress Notes (Signed)
Pt back from mri and immed. Went  Down for  usg liver bx. Pt sedated   From ativan given  [place] will contack  pts dtr to  Try and  Get consent

## 2016-10-04 NOTE — Progress Notes (Signed)
[facility]  Patient Status: Active  [facility] is providing the following services: PT  If patient discharges after hours, please call [phone].   Megan Cisneros 10/04/2016, 9:06 AM

## 2016-10-04 NOTE — Progress Notes (Signed)
[place] CONSULT NOTE  Patient Care Team: Donnie Coffin, MD as PCP - General (Family Medicine) Minna Merritts, MD as Consulting Physician (Cardiology)  CHIEF COMPLAINTS/PURPOSE OF CONSULTATION: Possible metastatic disease  CC:pt sleepy/ difficult to wake up. Had sedation for MRI this morning. US liver biopsy planned for tomorrow.   ROS: unable to assess.   MEDICAL HISTORY:  Past Medical History:  Diagnosis Date  . Atrial flutter ([place])    a. Dx 11/2015 -->CHA2DS2VASc = 7-->Eliquis '5mg'$  BID.  Marland Kitchen Breast cancer ([place])   . Chronic diastolic CHF (congestive heart failure) ([place])    a. 11/2015 Echo: EF 60-65%, no rwma, mild MR, mildly dil LA, nl RV fxn.  . Chronic Dyspnea on Exertion    a. 2004 Stress test prior to LE bypass->reportedly nl.  . Diabetes mellitus without complication ([place])    a. Dx as borderline diabetic in the 80's-->progressed over the years.  . Essential hypertension   . Lower extremity edema   . Morbid obesity ([place])   . PVC's (premature ventricular contractions)   . PVD (peripheral vascular disease) ([place])    a. 2004 s/p RLE bypass - complicated by recurrent infection.  . Stroke [facility])    a. TIA x 2;  b. Stroke in 2007 in [place].    SURGICAL HISTORY: Past Surgical History:  Procedure Laterality Date  . Cataract Surgery Right    03/2014  . CESAREAN SECTION    . ELECTROPHYSIOLOGIC STUDY N/A 08/09/2016   Procedure: CARDIOVERSION;  Surgeon: Wellington Hampshire, MD;  Location: [facility] ORS;  Service: Cardiovascular;  Laterality: N/A;  . LEG SURGERY    . MASTECTOMY Left   . 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 Hx of smoking quit 20 years ago. Patient used to live in [place]. Currently lives in [place] with her daughter.   Social History   Social History  . Marital status: Widowed    Spouse name: N/A  . Number of children: N/A  . Years of education: N/A   Occupational History  . Not  on file.   Social History Main Topics  . Smoking status: Former Research scientist (life sciences)  . Smokeless tobacco: Never Used  . Alcohol use No  . Drug use: No  . Sexual activity: Not on file   Other Topics Concern  . Not on file   Social History Narrative   Lives in [place] by herself.  Originally from [place], [place], later moved to [place] and then [place].  Has been in [place] for 8 yrs.  Family nearby.  Does not routinely exercise.   Has a cane and a walker    FAMILY HISTORY: Family History  Problem Relation Age of Onset  . Heart attack Father     died in his early 13's.  . Diabetes Father   . Cancer Mother     deceased  . Colon cancer Mother   . Cancer Brother     multiple cancers, deceased.  . Leukemia Brother   . Liver cancer Brother   . Pancreatic cancer Brother     ALLERGIES:  has No Known Allergies.  MEDICATIONS:  Current Facility-Administered Medications  Medication Dose Route Frequency Provider Last Rate Last Dose  . 0.9 %  sodium chloride infusion   Intravenous Continuous Arne Cleveland, MD      . acetaminophen (TYLENOL) tablet 650 mg  650 mg Oral Q6H PRN Epifanio Lesches, MD  Or  . acetaminophen (TYLENOL) suppository 650 mg  650 mg Rectal Q6H PRN Epifanio Lesches, MD      . albuterol (PROVENTIL) (2.5 MG/3ML) 0.083% nebulizer solution 2.5 mg  2.5 mg Inhalation Q4H PRN Epifanio Lesches, MD      . atorvastatin (LIPITOR) tablet 80 mg  80 mg Oral q1800 Epifanio Lesches, MD   80 mg at 10/03/16 2138  . bisacodyl (DULCOLAX) EC tablet 5 mg  5 mg Oral Daily PRN Epifanio Lesches, MD      . cephALEXin (KEFLEX) capsule 500 mg  500 mg Oral Q12H Vaughan Basta, MD   500 mg at 10/04/16 1540  . diltiazem (CARDIZEM CD) 24 hr capsule 120 mg  120 mg Oral Daily Epifanio Lesches, MD   120 mg at 10/04/16 0906  . docusate sodium (COLACE) capsule 100 mg  100 mg Oral BID Epifanio Lesches, MD   100 mg at 10/04/16 0906  . feeding supplement (ENSURE ENLIVE) (ENSURE ENLIVE) liquid 237 mL   237 mL Oral BID BM Vaughan Basta, MD   237 mL at 10/03/16 1010  . furosemide (LASIX) tablet 40 mg  40 mg Oral BID Epifanio Lesches, MD   40 mg at 10/04/16 0906  . gabapentin (NEURONTIN) capsule 100 mg  100 mg Oral BID Alexis Hugelmeyer, DO   100 mg at 10/04/16 0906  . heparin injection 5,000 Units  5,000 Units Subcutaneous Q8H Epifanio Lesches, MD   5,000 Units at 10/04/16 1540  . HYDROcodone-acetaminophen (NORCO/VICODIN) 5-325 MG per tablet 1-2 tablet  1-2 tablet Oral Q4H PRN Epifanio Lesches, MD   2 tablet at 10/04/16 0126  . insulin aspart (novoLOG) injection 0-9 Units  0-9 Units Subcutaneous TID WC Vaughan Basta, MD   1 Units at 10/04/16 1242  . insulin detemir (LEVEMIR) injection 5 Units  5 Units Subcutaneous QHS Epifanio Lesches, MD   5 Units at 10/02/16 2205  . lisinopril (PRINIVIL,ZESTRIL) tablet 10 mg  10 mg Oral Daily Epifanio Lesches, MD   10 mg at 10/04/16 0905  . metFORMIN (GLUCOPHAGE) tablet 1,000 mg  1,000 mg Oral BID WC Epifanio Lesches, MD   Stopped at 10/04/16 1700  . metoprolol tartrate (LOPRESSOR) tablet 25 mg  25 mg Oral BID Epifanio Lesches, MD   25 mg at 10/04/16 0906  . multivitamin with minerals tablet 1 tablet  1 tablet Oral Daily Epifanio Lesches, MD   1 tablet at 10/04/16 0907  . ondansetron (ZOFRAN) tablet 4 mg  4 mg Oral Q6H PRN Epifanio Lesches, MD       Or  . ondansetron (ZOFRAN) injection 4 mg  4 mg Intravenous Q6H PRN Epifanio Lesches, MD   4 mg at 10/02/16 1314  . oxybutynin (DITROPAN) tablet 5 mg  5 mg Oral BID Epifanio Lesches, MD   5 mg at 10/04/16 0905  . pantoprazole (PROTONIX) EC tablet 40 mg  40 mg Oral Daily Epifanio Lesches, MD   40 mg at 10/04/16 0905  . predniSONE (DELTASONE) tablet 1 mg  1 mg Oral Q breakfast Vaughan Basta, MD   1 mg at 10/04/16 0907  . traZODone (DESYREL) tablet 100 mg  100 mg Oral BID Epifanio Lesches, MD   100 mg at 10/04/16 0907  . traZODone (DESYREL) tablet 25 mg  25  mg Oral QHS PRN Epifanio Lesches, MD          .  PHYSICAL EXAMINATION:  Vitals:   10/04/16 0823 10/04/16 1352  BP: (!) 117/59 124/72  Pulse: (!) 114   Resp: Marland Kitchen)  22 14  Temp: 97.8 F (36.6 C) 97.5 F (36.4 C)   Filed Weights   10/02/16 0500 10/03/16 0500 10/04/16 0500  Weight: 163 lb 14.4 oz (74.3 kg) 166 lb 12.8 oz (75.7 kg) 169 lb 1.6 oz (76.7 kg)    GENERAL: Well-nourished well-developed; Sleepy; no distress and comfortable.   She is alone.  EYES: no pallor or icterus OROPHARYNX: no thrush or ulceration. Poor dentition NECK: supple, no masses felt LYMPH:  no palpable lymphadenopathy in the cervical, axillary or inguinal regions LUNGS: decreased breath sounds to auscultation at bases and  No wheeze or crackles HEART/CVS: regular rate & rhythm and no murmurs; No lower extremity edema ABDOMEN: abdomen soft, non-tender and normal bowel sounds Musculoskeletal:no cyanosis of digits and no clubbing  PSYCH: sleepy. NEURO:unable to examine SKIN:  no rashes or significant lesions  LABORATORY DATA:  I have reviewed the data as listed Lab Results  Component Value Date   WBC 9.2 10/02/2016   HGB 12.0 10/02/2016   HCT 37.3 10/02/2016   MCV 87.2 10/02/2016   PLT 228 10/02/2016    Recent Labs  08/09/16 0516 10/01/16 1408 10/02/16 0458  NA 136 135 136  K 4.1 4.0 3.7  CL 93* 91* 94*  CO2 33* 31 32  GLUCOSE 155* 385* 361*  BUN 33* 38* 37*  CREATININE 1.28* 1.14* 1.08*  CALCIUM 9.4 9.0 8.6*  GFRNONAA 44* 50* 54*  GFRAA 51* 58* >60    RADIOGRAPHIC STUDIES: I have personally reviewed the radiological images as listed and agreed with the findings in the report. Dg Chest 1 View  Result Date: 10/01/2016 CLINICAL DATA:  Acute onset of generalized weakness. Initial encounter. EXAM: CHEST 1 VIEW COMPARISON:  Chest radiographs performed 08/05/2016 and 12/11/2015 FINDINGS: Enlarging dense right hilar opacity is suspicious for malignancy. The left lung appears relatively  clear. No pleural effusion or pneumothorax is seen. There is elevation of the right hemidiaphragm. The cardiomediastinal silhouette is normal in size. No acute osseous abnormalities are identified. IMPRESSION: Enlarging dense right hilar opacity is suspicious for malignancy. CT of the chest with contrast is recommended for further evaluation. Electronically Signed   By: Garald Balding M.D.   On: 10/01/2016 17:35   Dg Lumbar Spine 2-3 Views  Result Date: 10/02/2016 CLINICAL DATA:  Back pain and weakness for 2 days. EXAM: LUMBAR SPINE - 2-3 VIEW COMPARISON:  CT scan October 01, 2016 FINDINGS: Scalloping of the L4 superior endplate was better assessed on yesterday's CT scan and is unchanged. Degenerative changes in the lower thoracic spine. No other interval changes or bony abnormalities. IMPRESSION: No change since yesterday's CT scan. Continued scalloping of the L4 superior endplate. Electronically Signed   By: Dorise Bullion III M.D   On: 10/02/2016 17:33   Ct Head Wo Contrast  Result Date: 10/01/2016 CLINICAL DATA:  Weakness for 2 weeks EXAM: CT HEAD WITHOUT CONTRAST TECHNIQUE: Contiguous axial images were obtained from the base of the skull through the vertex without intravenous contrast. COMPARISON:  None. FINDINGS: Brain: No evidence of acute infarction, hemorrhage, extra-axial collection, ventriculomegaly, or mass effect. Generalized cerebral atrophy. Periventricular white matter low attenuation likely secondary to microangiopathy. Vascular: Cerebrovascular atherosclerotic calcifications are noted. Skull: Negative for fracture or focal lesion. Sinuses/Orbits: Visualized portions of the orbits are unremarkable. Visualized portions of the paranasal sinuses and mastoid air cells are unremarkable. Other: None. IMPRESSION: 1. No acute intracranial pathology. 2. Chronic microvascular disease and cerebral atrophy. Electronically Signed   By: Kathreen Devoid  On: 10/01/2016 17:24   Ct Chest W Contrast  Result  Date: 10/03/2016 CLINICAL DATA:  Metastatic breast cancer EXAM: CT CHEST, ABDOMEN, AND PELVIS WITH CONTRAST TECHNIQUE: Multidetector CT imaging of the chest, abdomen and pelvis was performed following the standard protocol during bolus administration of intravenous contrast. CONTRAST:  146m ISOVUE-300 IOPAMIDOL (ISOVUE-300) INJECTION 61% COMPARISON:  CT scan 10/01/2016 FINDINGS: CT CHEST FINDINGS Chest wall: Status post left mastectomy. No right-sided breast masses are identified. No axillary lymphadenopathy. 8 mm supraclavicular lymph node noted on image 2. There also enlarge subclavicular lymph nodes on the right side. The thyroid gland is grossly normal. Cardiovascular: The heart is within normal limits in size and stable. All stable dense atherosclerotic calcifications involving the aorta and branch vessels. No aneurysm or dissection. Three-vessel coronary artery calcifications are noted. Mediastinum/Nodes: There is extensive mediastinal and right hilar tumor. Large nodal masses throughout the mediastinum. The right paratracheal mass measures 4.8 x 3.9 cm on image 17. There is severe compression of the SVC but it is not completely occluded. Right hilar/ mediastinal mass on image number 25 measures 7.8 x 6.7 cm. Subcarinal nodal mass on image number 28 measures 34 x 30 mm. Lungs/Pleura: Lobulated right upper lobe lung mass on image number 65 measures 38 x 32 mm. Marked interstitial thickening on the right could suggest interstitial spread of tumor. The left lung is relatively clear. No acute pulmonary findings or worrisome pulmonary lesions. There are small bilateral pleural effusions. Musculoskeletal: Mixed lytic and sclerotic sternal lesions are suspicious for metastatic disease. No other obvious bone lesions. CT ABDOMEN PELVIS FINDINGS Hepatobiliary: Index lesion in the right lobe on image number 53 measures 8.6 x 4.7 cm. Index lesion in the lateral segment of the left hepatic lobe on image number 52 measures  5.4 x 4.1 cm. The gallbladder is grossly normal. A stable calcified gallstone is noted. Small amount of pericholecystic fluid. Pancreas: No mass, inflammation or ductal dilatation. Spleen: Normal size.  No focal lesions. Adrenals/Urinary Tract: Left adrenal gland lesions suspicious for metastasis. The right adrenal gland is normal. Complex enhancing left upper pole renal lesion measures 5.9 x 4.2 cm and is consistent with renal cell carcinoma. No renal vein invasion. Stomach/Bowel: The stomach, duodenum, small bowel and colon are grossly normal. No inflammatory changes, mass lesions or obstructive findings. Vascular/Lymphatic: Bulky knee upper abdominal lymphadenopathy with a confluent mass in the portal region, gastrohepatic ligament and celiac axis. This measures 10.2 x 6.5 cm on image number 55. There is compression of the extrahepatic portal vein. There is also bulky retroperitoneal lymphadenopathy bilaterally. Advanced atherosclerotic calcifications involving the aorta and branch vessels. No aneurysm or dissection. Reproductive: The uterus and ovaries are unremarkable. Other: No pelvic mass or adenopathy. No free pelvic fluid collections. Moderate distention of the bladder. No inguinal mass or adenopathy. Musculoskeletal: No definite lytic or sclerotic bone lesions to suggest metastasis. IMPRESSION: 1. Bulky mediastinal and right hilar lymphadenopathy. Imminent SVC occlusion. 2. Large right upper lobe lung mass and surrounding interstitial thickening suggesting interstitial spread of tumor. 3. Diffuse hepatic metastatic disease and bulky upper abdominal and retroperitoneal lymphadenopathy. 4. Complex enhancing partially calcified left upper pole renal mass most likely renal cell carcinoma. 5. Suspect sternal metastatic disease. I do not see any other obvious bone lesions. 6. Advanced atherosclerotic calcifications involving the thoracic and abdominal aorta and branch vessels. Electronically Signed   By: PMarijo SanesM.D.   On: 10/03/2016 16:26   Mr Lumbar Spine Wo Contrast  Result Date: 10/04/2016  CLINICAL DATA:  Acute on chronic low back pain with a recent fall. Recently diagnosed metastatic disease. L4 compression fracture. EXAM: MRI LUMBAR SPINE WITHOUT CONTRAST TECHNIQUE: Multiplanar, multisequence MR imaging of the lumbar spine was performed. No intravenous contrast was administered. COMPARISON:  CT abdomen and pelvis 10/03/2016 FINDINGS: Segmentation:  Standard. Alignment:  Slight anterolisthesis of L4 on L5 due to facet disease. Vertebrae: L4 superior endplate fracture with 03% height loss centrally. No substantial marrow edema is identified directly subjacent to the compressed portion of the endplate. There is some hyperintense STIR signal more posteriorly along the superior endplate and in the right aspect of the vertebral body, however this is similar in appearance to multiple other levels and is felt to reflect an underlying osseous lesion rather than edema from an acute fracture. Bone marrow signal is diffusely abnormal throughout the visualized lower thoracic and lumbar spine as well as sacrum with replacement of the majority of normal fatty marrow signal and numerous STIR hyperintense lesions throughout. No gross paraspinal or epidural tumor is identified on this unenhanced study. Conus medullaris: Extends to the L1 level and appears normal. Paraspinal and other soft tissues: Liver metastases and retroperitoneal lymphadenopathy as described on recent CT. Disc levels: T12-L1:  Minimal disc bulging without stenosis. L1-2:  Negative. L2-3:  Negative. L3-4: Disc bulging, shallow central disc protrusion, and ligamentum flavum thickening result in mild right greater than left lateral recess stenosis, mild to moderate spinal stenosis, and no significant neural foraminal stenosis. L4-5: Disc bulging, shallow left paracentral disc protrusion, ligamentum flavum thickening, and facet arthrosis result in moderate  spinal stenosis, mild right and moderate left lateral recess stenosis, and mild left neural foraminal stenosis. There are small facet joint effusions, right larger than left. L5-S1: Right paracentral disc protrusion results in minimal right lateral recess narrowing and slight posterior deflection of the adjacent right S1 nerve root. No spinal or neural foraminal stenosis. Mild facet arthrosis. IMPRESSION: 1. Diffuse osseous metastatic disease. 2. L4 superior endplate fracture with mild height loss favored to be chronic. 3. Moderate spinal stenosis and left greater than right lateral recess stenosis at L4-5. 4. Mild-to-moderate spinal stenosis at L3-4. Electronically Signed   By: Logan Bores M.D.   On: 10/04/2016 12:04   Ct Abdomen Pelvis W Contrast  Result Date: 10/03/2016 CLINICAL DATA:  Metastatic breast cancer EXAM: CT CHEST, ABDOMEN, AND PELVIS WITH CONTRAST TECHNIQUE: Multidetector CT imaging of the chest, abdomen and pelvis was performed following the standard protocol during bolus administration of intravenous contrast. CONTRAST:  131m ISOVUE-300 IOPAMIDOL (ISOVUE-300) INJECTION 61% COMPARISON:  CT scan 10/01/2016 FINDINGS: CT CHEST FINDINGS Chest wall: Status post left mastectomy. No right-sided breast masses are identified. No axillary lymphadenopathy. 8 mm supraclavicular lymph node noted on image 2. There also enlarge subclavicular lymph nodes on the right side. The thyroid gland is grossly normal. Cardiovascular: The heart is within normal limits in size and stable. All stable dense atherosclerotic calcifications involving the aorta and branch vessels. No aneurysm or dissection. Three-vessel coronary artery calcifications are noted. Mediastinum/Nodes: There is extensive mediastinal and right hilar tumor. Large nodal masses throughout the mediastinum. The right paratracheal mass measures 4.8 x 3.9 cm on image 17. There is severe compression of the SVC but it is not completely occluded. Right hilar/  mediastinal mass on image number 25 measures 7.8 x 6.7 cm. Subcarinal nodal mass on image number 28 measures 34 x 30 mm. Lungs/Pleura: Lobulated right upper lobe lung mass on image number 65 measures 38 x  32 mm. Marked interstitial thickening on the right could suggest interstitial spread of tumor. The left lung is relatively clear. No acute pulmonary findings or worrisome pulmonary lesions. There are small bilateral pleural effusions. Musculoskeletal: Mixed lytic and sclerotic sternal lesions are suspicious for metastatic disease. No other obvious bone lesions. CT ABDOMEN PELVIS FINDINGS Hepatobiliary: Index lesion in the right lobe on image number 53 measures 8.6 x 4.7 cm. Index lesion in the lateral segment of the left hepatic lobe on image number 52 measures 5.4 x 4.1 cm. The gallbladder is grossly normal. A stable calcified gallstone is noted. Small amount of pericholecystic fluid. Pancreas: No mass, inflammation or ductal dilatation. Spleen: Normal size.  No focal lesions. Adrenals/Urinary Tract: Left adrenal gland lesions suspicious for metastasis. The right adrenal gland is normal. Complex enhancing left upper pole renal lesion measures 5.9 x 4.2 cm and is consistent with renal cell carcinoma. No renal vein invasion. Stomach/Bowel: The stomach, duodenum, small bowel and colon are grossly normal. No inflammatory changes, mass lesions or obstructive findings. Vascular/Lymphatic: Bulky knee upper abdominal lymphadenopathy with a confluent mass in the portal region, gastrohepatic ligament and celiac axis. This measures 10.2 x 6.5 cm on image number 55. There is compression of the extrahepatic portal vein. There is also bulky retroperitoneal lymphadenopathy bilaterally. Advanced atherosclerotic calcifications involving the aorta and branch vessels. No aneurysm or dissection. Reproductive: The uterus and ovaries are unremarkable. Other: No pelvic mass or adenopathy. No free pelvic fluid collections. Moderate  distention of the bladder. No inguinal mass or adenopathy. Musculoskeletal: No definite lytic or sclerotic bone lesions to suggest metastasis. IMPRESSION: 1. Bulky mediastinal and right hilar lymphadenopathy. Imminent SVC occlusion. 2. Large right upper lobe lung mass and surrounding interstitial thickening suggesting interstitial spread of tumor. 3. Diffuse hepatic metastatic disease and bulky upper abdominal and retroperitoneal lymphadenopathy. 4. Complex enhancing partially calcified left upper pole renal mass most likely renal cell carcinoma. 5. Suspect sternal metastatic disease. I do not see any other obvious bone lesions. 6. Advanced atherosclerotic calcifications involving the thoracic and abdominal aorta and branch vessels. Electronically Signed   By: Marijo Sanes M.D.   On: 10/03/2016 16:26   Ct Renal Stone Study  Result Date: 10/01/2016 CLINICAL DATA:  Flank pain and hematuria. EXAM: CT ABDOMEN AND PELVIS WITHOUT CONTRAST TECHNIQUE: Multidetector CT imaging of the abdomen and pelvis was performed following the standard protocol without IV contrast. COMPARISON:  None. FINDINGS: Lower chest:  No contributory findings. Hepatobiliary: The liver is nearly replaced by innumerable masses.Cholelithiasis. No evidence of acute cholecystitis. Pancreas: The head and neck is indistinguishable from bulky retroperitoneal adenopathy. Spleen: Unremarkable. Adrenals/Urinary Tract: Negative adrenals. 4 cm mass exophytic from the upper pole left kidney. Small renal calculi may be present. No hydronephrosis or ureteral calculus (ovarian vein phlebolith noted on the right). 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 Asymmetric bladder wall thickening to the left. Stomach/Bowel:  No obstruction. No inflammatory changes. Vascular/Lymphatic: Bulky adenopathy in the upper abdomen with confluent nodal mass extending from the gastrohepatic ligament to the hepatic hilum and measuring up to 10 cm. Retroperitoneal adenopathy continues  inferiorly to the level of the aortic bifurcation. Atherosclerotic calcification Reproductive:No pathologic findings. Other: No ascites or pneumoperitoneum. Musculoskeletal: Heterogeneous density of marrow without discrete destructive lesion. There is a L4 superior endplate with chronic appearance based on overlying vacuum phenomenon. Height loss is mild. Bone metastases may be present. IMPRESSION: 1. Extensive metastatic disease in the liver and retroperitoneal lymph nodes. Patient has history of breast cancer and there  is a 4 cm left renal mass compatible with renal cell carcinoma. 2. Asymmetric left bladder wall thickening. 3. L4 superior endplate fracture, with nonacute appearance. This may be pathologic. Electronically Signed   By: Monte Fantasia M.D.   On: 10/01/2016 17:57    ASSESSMENT & PLAN:  # 61 year old female patient currently admitted to the hospital for generalized weakness/UTI. Imaging shows concerns for metastatic disease  # CT chest and pelvis- hilar/mediastinal adenopathy; concerning for superior vena cava compression. Also shows multiple liver lesions;  retroperitoneal lymph nodes; and also left-sided kidney mass approximately 4 cm in size. Given the spread of the disease concerning for primary lung/small cell with metastases to the liver. We'll recommend evaluation with radiation oncology for possible need for RT.   # I would recommend holding the Eliquis in anticipation of the need for biopsy.  # Multiple comorbidities including- history of stroke/TIA; atrial flutter on Eliquis; CHF; history of peripheral vascular disease.    Cammie Sickle, MD 10/04/2016 4:56 PM

## 2016-10-04 NOTE — Progress Notes (Signed)
Patient received to [place] for liver biopsy-sedated from Ativan given for MRI.  Patient unable to consent self d/t sedation.  Per Dr. Sunday Corn wait for biopsy until patient able to discuss/consent for biopsy.  Returned to inpatient unit.

## 2016-10-04 NOTE — Plan of Care (Signed)
Problem: Education: Goal: Knowledge of  General Education information/materials will improve Outcome: Progressing Pt to be npo after mn tonight  For US guided bx in am.  See note.  Pt was too sedated earlier  After being medicated from mri.   Problem: Safety: Goal: Ability to remain free from injury will improve Outcome: Progressing Requires 2 assists to be up. Used bedpan later this pm. To void  Problem: Activity: Goal: Risk for activity intolerance will decrease Outcome: Progressing See note  Problem: Fluid Volume: Goal: Ability to maintain a balanced intake and output will improve Outcome: Progressing No maintenance flds needed

## 2016-10-04 NOTE — Progress Notes (Signed)
Notified MD of current heprin order prior to biopsy, acknowledged.  New order to hold afternoon dose, received. Updated MD on medication administration, acknowledge, pt resting in bed continue to assess.

## 2016-10-04 NOTE — Progress Notes (Signed)
Pt down for mri  Ativan given  proir  For anxiety

## 2016-10-04 NOTE — Progress Notes (Signed)
This RN was present with [place], student-Rn, who communicated with attending MD and primary RN on patient care provided.

## 2016-10-05 ENCOUNTER — Inpatient Hospital Stay: Payer: Medicare Other

## 2016-10-05 ENCOUNTER — Institutional Professional Consult (permissible substitution): Payer: Medicare Other | Admitting: Radiation Oncology

## 2016-10-05 DIAGNOSIS — K769 Liver disease, unspecified: Secondary | ICD-10-CM

## 2016-10-05 DIAGNOSIS — Z515 Encounter for palliative care: Secondary | ICD-10-CM

## 2016-10-05 DIAGNOSIS — Z7189 Other specified counseling: Secondary | ICD-10-CM

## 2016-10-05 LAB — GLUCOSE, CAPILLARY
Glucose-Capillary: 109 mg/dL — ABNORMAL HIGH (ref 65–99)
Glucose-Capillary: 111 mg/dL — ABNORMAL HIGH (ref 65–99)
Glucose-Capillary: 97 mg/dL (ref 65–99)
Glucose-Capillary: 108 mg/dL — ABNORMAL HIGH (ref 65–99)

## 2016-10-05 LAB — BASIC METABOLIC PANEL
Anion gap: 13 (ref 5–15)
BUN: 33 mg/dL — AB (ref 6–20)
CALCIUM: 8.2 mg/dL — AB (ref 8.9–10.3)
CO2: 31 mmol/L (ref 22–32)
CREATININE: 1.11 mg/dL — AB (ref 0.44–1.00)
Chloride: 96 mmol/L — ABNORMAL LOW (ref 101–111)
GFR calc non Af Amer: 52 mL/min — ABNORMAL LOW (ref >60)
GLUCOSE: 98 mg/dL (ref 65–99)
Potassium: 3.8 mmol/L (ref 3.5–5.1)
Sodium: 140 mmol/L (ref 135–145)

## 2016-10-05 MED ORDER — DILTIAZEM HCL ER COATED BEADS 240 MG PO CP24
240.0000 mg | ORAL_CAPSULE | Freq: Every day | ORAL | Status: DC
  Administered 2016-10-05: 240 mg via ORAL
  Filled 2016-10-05 (×2): qty 1

## 2016-10-05 MED ORDER — FUROSEMIDE 40 MG PO TABS
40.0000 mg | ORAL_TABLET | Freq: Every day | ORAL | Status: DC
  Administered 2016-10-05: 40 mg via ORAL
  Filled 2016-10-05: qty 1

## 2016-10-05 MED ORDER — SODIUM CHLORIDE 0.9 % IV SOLN
INTRAVENOUS | Status: DC
  Administered 2016-10-05 – 2016-10-06 (×3): via INTRAVENOUS
  Administered 2016-10-07: 150 mL/h via INTRAVENOUS
  Administered 2016-10-08 – 2016-10-09 (×2): via INTRAVENOUS

## 2016-10-05 MED ORDER — FENTANYL CITRATE (PF) 100 MCG/2ML IJ SOLN
INTRAMUSCULAR | Status: AC
  Filled 2016-10-05: qty 2

## 2016-10-05 MED ORDER — MIDAZOLAM HCL 2 MG/2ML IJ SOLN
INTRAMUSCULAR | Status: AC
  Filled 2016-10-05: qty 2

## 2016-10-05 NOTE — Care Management Important Message (Signed)
Important Message  Patient Details  Name: Megan Cisneros MRN: [id number] Date of Birth: 1955-11-28   Medicare Important Message Given:  Yes    Shelbie Ammons, RN 10/05/2016, 8:37 AM

## 2016-10-05 NOTE — Plan of Care (Signed)
Problem: Education: Goal: Knowledge of [facility] Education information/materials will improve Outcome: Progressing Patient verbalized understanding of current treatment plan. Kept fasting after midnight, scheduled for ultrasound guided liver biopsy today 10/05/16. Kept safe and comfortable. Needs attended.

## 2016-10-05 NOTE — Sedation Documentation (Signed)
Unable to give sedation due to pt poor vital signs

## 2016-10-05 NOTE — Progress Notes (Signed)
Patient is scheduled for image guided liver biopsy.  Patient is very tachycardiac (HR in 120s) and low oxygen sats on oxygen.  BP has also been running low.  Will attempt US guided liver lesion biopsy without sedation if possible.  At this point, patient is high risk for moderate sedation.

## 2016-10-05 NOTE — Progress Notes (Signed)
Pt returned to floor  From specials recovery  Post US guided  Liver bx.  Alert. No resp distress.  Rt side abd area  bandaide intact without bleeding. vss

## 2016-10-05 NOTE — Procedures (Signed)
US guided core biopsies of a right hepatic lesion.  4 cores performed and 3 adequate cores obtained and placed in formalin.  Minimal bleeding and no immediate complication.

## 2016-10-05 NOTE — Consult Note (Signed)
NEW PATIENT EVALUATION  Name: Megan Cisneros  MRN: [id number]  Date:   10/01/2016     DOB: 1956/06/07   This 61 y.o. female patient presents to the clinic for initial evaluation of possible early SVC.  REFERRING PHYSICIAN: No ref. provider found  CHIEF COMPLAINT:  Chief Complaint  Patient presents with  . Weakness    DIAGNOSIS: The primary encounter diagnosis was Weakness. 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   PREVIOUS INVESTIGATIONS:  CT scans reviewed Clinical notes reviewed Possible liver biopsy pending  HPI: Patient is a 61 year old female who is been followed for sleep disturbances increasing shortness of breath and dyspnea on exertion and generalized weakness was found a large chest mass impinging on the superior vena, with widespread evidence of metastatic disease including liver metastasis. She also has a 4 cm left kidney lesion concerning for renal cell carcinoma. She is scheduled for liver biopsy and I been asked to evaluate the patient for possibility of palliative radiation therapy to her chest in view of the large chest mass and pending superior vena cava syndrome. She has no sequela support that at this point with no facial plethora venous jugular distention or collateral superficial veins. She does have some dysphasia which is been present for a while. She does have extensive history of congestive heart failure history of stroke TIA atrial flutter and history of peripheral vascular disease.  PLANNED TREATMENT REGIMEN: Palliative radiation therapy to chest  PAST MEDICAL HISTORY:  has a past medical history of Atrial flutter ([place]); Breast cancer ([place]); Chronic diastolic CHF (congestive heart failure) ([place]); Chronic Dyspnea on Exertion; Diabetes mellitus without complication ([place]); Essential  hypertension; Lower extremity edema; Morbid obesity ([place]); PVC's (premature ventricular contractions); PVD (peripheral vascular disease) ([place]); and Stroke ([place]).    PAST SURGICAL HISTORY:  Past Surgical History:  Procedure Laterality Date  . Cataract Surgery Right    03/2014  . CESAREAN SECTION    . ELECTROPHYSIOLOGIC STUDY N/A 08/09/2016   Procedure: CARDIOVERSION;  Surgeon: Wellington Hampshire, MD;  Location: [facility] ORS;  Service: Cardiovascular;  Laterality: N/A;  . LEG SURGERY    . MASTECTOMY Left   . right leg bypass Right     FAMILY HISTORY: family history includes Cancer in her brother and mother; Colon cancer in her mother; Diabetes in her father; Heart attack in her father; Leukemia in her brother; Liver cancer in her brother; Pancreatic cancer in her brother.  SOCIAL HISTORY:  reports that she has quit smoking. She has never used smokeless tobacco. She reports that she does not drink alcohol or use drugs.  ALLERGIES: Patient has no known allergies.  MEDICATIONS:  Current Facility-Administered Medications  Medication Dose Route Frequency Provider Last Rate Last Dose  . 0.9 %  sodium chloride infusion   Intravenous Continuous Arne Cleveland, MD      . 0.9 %  sodium chloride infusion   Intravenous Continuous Vaughan Basta, MD 100 mL/hr at 10/05/16 0923    . acetaminophen (TYLENOL) tablet 650 mg  650 mg Oral Q6H PRN Epifanio Lesches, MD       Or  . acetaminophen (TYLENOL) suppository 650 mg  650 mg Rectal Q6H PRN Epifanio Lesches, MD      . albuterol (PROVENTIL) (2.5 MG/3ML) 0.083% nebulizer solution 2.5 mg  2.5 mg Inhalation Q4H PRN Epifanio Lesches, MD      .  atorvastatin (LIPITOR) tablet 80 mg  80 mg Oral q1800 Epifanio Lesches, MD   80 mg at 10/04/16 1858  . bisacodyl (DULCOLAX) EC tablet 5 mg  5 mg Oral Daily PRN Epifanio Lesches, MD      . cephALEXin (KEFLEX) capsule 500 mg  500 mg Oral Q12H Vaughan Basta, MD   500 mg at 10/05/16 0902  . diltiazem  (TIAZAC) 24 hr capsule 240 mg  240 mg Oral Daily Vaughan Basta, MD   240 mg at 10/05/16 0903  . docusate sodium (COLACE) capsule 100 mg  100 mg Oral BID Epifanio Lesches, MD   100 mg at 10/05/16 0903  . feeding supplement (ENSURE ENLIVE) (ENSURE ENLIVE) liquid 237 mL  237 mL Oral BID BM Vaughan Basta, MD   237 mL at 10/03/16 1010  . furosemide (LASIX) tablet 40 mg  40 mg Oral Daily Vaughan Basta, MD   40 mg at 10/05/16 0903  . gabapentin (NEURONTIN) capsule 100 mg  100 mg Oral BID Alexis Hugelmeyer, DO   100 mg at 10/05/16 0903  . heparin injection 5,000 Units  5,000 Units Subcutaneous Q8H Epifanio Lesches, MD   Stopped at 10/05/16 0500  . HYDROcodone-acetaminophen (NORCO/VICODIN) 5-325 MG per tablet 1-2 tablet  1-2 tablet Oral Q4H PRN Epifanio Lesches, MD   1 tablet at 10/05/16 0606  . insulin aspart (novoLOG) injection 0-9 Units  0-9 Units Subcutaneous TID WC Vaughan Basta, MD   1 Units at 10/04/16 1242  . insulin detemir (LEVEMIR) injection 5 Units  5 Units Subcutaneous QHS Epifanio Lesches, MD   5 Units at 10/02/16 2205  . metFORMIN (GLUCOPHAGE) tablet 1,000 mg  1,000 mg Oral BID WC Epifanio Lesches, MD   Stopped at 10/04/16 1700  . metoprolol tartrate (LOPRESSOR) tablet 25 mg  25 mg Oral BID Epifanio Lesches, MD   25 mg at 10/05/16 0903  . multivitamin with minerals tablet 1 tablet  1 tablet Oral Daily Epifanio Lesches, MD   1 tablet at 10/05/16 0903  . ondansetron (ZOFRAN) tablet 4 mg  4 mg Oral Q6H PRN Epifanio Lesches, MD       Or  . ondansetron (ZOFRAN) injection 4 mg  4 mg Intravenous Q6H PRN Epifanio Lesches, MD   4 mg at 10/02/16 1314  . oxybutynin (DITROPAN) tablet 5 mg  5 mg Oral BID Epifanio Lesches, MD   5 mg at 10/05/16 0902  . pantoprazole (PROTONIX) EC tablet 40 mg  40 mg Oral Daily Epifanio Lesches, MD   40 mg at 10/05/16 0903  . predniSONE (DELTASONE) tablet 1 mg  1 mg Oral Q breakfast Vaughan Basta, MD   1  mg at 10/05/16 0903  . traZODone (DESYREL) tablet 100 mg  100 mg Oral BID Epifanio Lesches, MD   100 mg at 10/04/16 2051  . traZODone (DESYREL) tablet 25 mg  25 mg Oral QHS PRN Epifanio Lesches, MD        ECOG PERFORMANCE STATUS:  3 - Symptomatic, >50% confined to bed  REVIEW OF SYSTEMS: Except for the shortness of breath dyspnea on exertion inability to ambulate  Patient denies any weight loss, fatigue, weakness, fever, chills or night sweats. Patient denies any loss of vision, blurred vision. Patient denies any ringing  of the ears or hearing loss. No irregular heartbeat. Patient denies heart murmur or history of fainting. Patient denies any chest pain or pain radiating to her upper extremities. Patient denies any shortness of breath, difficulty breathing at night, cough or hemoptysis. Patient denies  any swelling in the lower legs. Patient denies any nausea vomiting, vomiting of blood, or coffee ground material in the vomitus. Patient denies any stomach pain. Patient states has had normal bowel movements no significant constipation or diarrhea. Patient denies any dysuria, hematuria or significant nocturia. Patient denies any problems walking, swelling in the joints or loss of balance. Patient denies any skin changes, loss of hair or loss of weight. Patient denies any excessive worrying or anxiety or significant depression. Patient denies any problems with insomnia. Patient denies excessive thirst, polyuria, polydipsia. Patient denies any swollen glands, patient denies easy bruising or easy bleeding. Patient denies any recent infections, allergies or URI. Patient "s visual fields have not changed significantly in recent time.   PHYSICAL EXAM: BP (!) 117/56 (BP Location: Left Arm)   Pulse (!) 124   Temp 98.4 F (36.9 C) (Oral)   Resp 20   Ht 5' (1.524 m)   Wt 165 lb 9.6 oz (75.1 kg)   SpO2 95%   BMI 32.34 kg/m  Patient is bedridden. No evidence of venous jugular distention facial plethora  or collateral venous circulation. She does have decreased breath sounds in the anterior chest. Well-developed well-nourished patient in NAD. HEENT reveals PERLA, EOMI, discs not visualized.  Oral cavity is clear. No oral mucosal lesions are identified. Neck is clear without evidence of cervical or supraclavicular adenopathy. Lungs are clear to A&P. Cardiac examination is essentially unremarkable with regular rate and rhythm without murmur rub or thrill. Abdomen is benign with no organomegaly or masses noted. Motor sensory and DTR levels are equal and symmetric in the upper and lower extremities. Cranial nerves II through XII are grossly intact. Proprioception is intact. No peripheral adenopathy or edema is identified. No motor or sensory levels are noted. Crude visual fields are within normal range.  LABORATORY DATA: Laboratory results have been reviewed. 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 Would plan on delivering 3000 cGy in 10 fractions and evaluate for response. I based this on the significant obstruction of her vasculature with his large mediastinal and right hilar mass which is presumed lung cancer most likely small cell cancer. She's having a CT-guided biopsy of her liver to confirm her diagnosis. I like to get started on treatment planning to be able to initiate treatments as soon as possible to prevent further compromise of her vasculature. I've person set up and ordered CT simulation for tomorrow. Risks and benefits of treatment including possible dysphasia from radiation esophagitis, fatigue alteration of blood counts I have personally scheduled her CT simulation for tomorrow. I discussed the  case personally with medical oncology.  I would like to take this opportunity to thank you for allowing me to participate in the care of your patient.Armstead Peaks., MD

## 2016-10-05 NOTE — Care Management (Signed)
Admitted to [facility] with the diagnosis of metastatic lung cancer. Lives alone. Daughter is Aryahna Spagna [phone]). Goes to [facility] and sees Dr. Clide Deutscher. Gets prescriptions filled at [facility]. No home oxygen. Active with [facility] weekly. States she has been is skilled facilities in the past, but couldn't remember name of facility. Rolling walker and rollator in the home. No falls. Decreased appetite. Takes care of all basic activities of daily living herself. NPO Scheduled  for Liver Biopsy today. Shelbie Ammons RN MSN CCM Care Management

## 2016-10-05 NOTE — Consult Note (Signed)
Consultation Note Date: 10/05/2016   Patient Name: Megan Cisneros  DOB: 12-31-1955  MRN: [id number]  Age / Sex: 61 y.o., female  PCP: Megan Coffin, MD Referring Physician: Vaughan Basta, MD  Reason for Consultation: Establishing goals of care  HPI/Patient Profile: 61 y.o. female  with past medical history of CVA, PVD, essential  hypertension, DM, diastolic CHF, afib/flutter, and breast cancer admitted on 10/01/2016 with weakness, fall, rectal bleeding, and weight loss. Complains of rectal bleeding for 1 month without evidence of abdominal pain. Per daughter, patient has lost about 20 lbs in 2 months and with decreased appetite. CT chest/pelvis reveals hilar/mediastinal adenopathy concerning for superior vena cava compression, multiple liver lesions, retroperitoneal lymph nodes, and left-sided kidney mass approximately 4cm. Evaluated by Dr. Gay Cisneros for primary lung/small cell with metastases to liver. Plan for US liver biopsy today, 2/6. Radiation oncology consulted and plans to start palliative radiation therapy to chest. Palliative medicine consultation for goals of care.    Clinical Assessment and Goals of Care: I have reviewed medical records and met with patient at bedside to discuss diagnosis, GOC, EOL wishes, disposition and options.  Introduced Palliative Medicine as specialized medical care for people living with serious illness. It focuses on providing relief from the symptoms and stress of a serious illness. The goal is to improve quality of life for both the patient and the family.  We discussed a brief life review of the patient. Prior to hospitalization, patient was living alone with support from daughter. Two sons have recently moved to [place] for work. She has two grandchildren that "mean the world" to her. The last two months, she has had increased weakness, weight loss, and poor appetite  with acid reflux. I attempted to elicit values and goals of care important to the patient. She is a spiritual individual of Catholic faith. Her family and faith are the most important aspects of her life.   Patient very tearful throughout our conversation. She understands she likely has metastatic cancer and willing to do what is recommended by oncology/radiation oncology in order to "continue fighting." She repeatedly tells me she "is not ready to die" but then tells me "she has had to be strong" her entire life and is "tired." Megan Cisneros speaks of losing her husband to alcohol abuse and suicide when he was 63yo. She also speaks of her fight with breast cancer many years ago. She tells me she is trying to "stay strong" for her family because they are all very angry from hearing of her cancer. She is most afraid of not being here for her grandchildren.   Advanced directives and concepts specific to code status were addressed. When asked about resuscitation and life support, she tells me she would not want her family to have to make the decision to take her off of life support. I educated on my recommendation for DNR/DNI with multiple co-morbidities and cancer. Strongly encouraged her to discuss these big decisions that she will unfortunately be faced with in the future and the  importance of her speaking with her family now about EOL wishes, while she is able to make her own decisions. She seems hesitant to discuss code status with daughter. I offered to discuss this again when her daughter is present, which she agrees with. Hard Choices copy left for her to read.   Palliative Care services outpatient were explained and offered. Patient agreeable with palliative to follow at SNF on discharge.    SUMMARY OF RECOMMENDATIONS    Discussed and educated on code status/advanced directives. Remains FULL code. Hard Choices copy left for review.  Biopsy planned for this afternoon. Patient is agreeable for me to  reach out to her daughter tomorrow to discuss GOC/code status.   Patient wanting to pursue treatment options as recommended by oncology and radiation oncology.   Plan is SNF for rehab on discharge. Palliative services to follow.   PMT will continue conversations on [place], advanced directives, code status, prognosis, and EOL wishes during hospitalization.   Code Status/Advance Care Planning:  Full code   Symptom Management:   Per attending  Palliative Prophylaxis:   Frequent Pain Assessment and Turn Reposition  Additional Recommendations (Limitations, Scope, Preferences):  Full Scope Treatment  Psycho-social/Spiritual:   Desire for further Chaplaincy support:yes  Additional Recommendations: Caregiving  Support/Resources and Education on Hospice  Prognosis:   Unable to determine  Discharge Planning: To Be Determined      Primary Diagnoses: Present on Admission: . Metastatic lung cancer (metastasis from lung to other site) [facility])   I have reviewed the medical record, interviewed the patient and family, and examined the patient. The following aspects are pertinent.  Past Medical History:  Diagnosis Date  . Atrial flutter ([place])    a. Dx 11/2015 -->CHA2DS2VASc = 7-->Eliquis 79m BID.  .Mar[place] Kitchen[place]ast cancer ([place]   . Chronic diastolic CHF (congestive heart failure) ([place]    a. 11/2015 Echo: EF 60-65%, no rwma, mild MR, mildly dil LA, nl RV fxn.  . Chronic Dyspnea on Exertion    a. 2004 Stress test prior to LE bypass->reportedly nl.  . Diabetes mellitus without complication ([place]    a. Dx as borderline diabetic in the 80's-->progressed over the years.  . Essential hypertension   . Lower extremity edema   . Morbid obesity ([place]   . PVC's (premature ventricular contractions)   . PVD (peripheral vascular disease) ([place]    a. 2004 s/p RLE bypass - complicated by recurrent infection.  . Stroke ([facility]    a. TIA x 2;  [place]. [place]   Social History   Social  History  . Marital status: Widowed    Spouse name: N/A  . Number of children: N/A  . Years of education: N/A   Social History Main Topics  . Smoking status: Former SResearch scientist (life sciences) . Smokeless tobacco: Never Used  . Alcohol use No  . Drug use: No  . Sexual activity: Not Asked   Other Topics Concern  . None   Social History Narrative   [place]  Has been in [place] for 8 yrs.  Family nearby.  Does not routinely exercise.   Has a cane and a walker   Family History  Problem Relation Age of Onset  . Heart attack Father     died in his early 631's  . Diabetes Father   . Cancer Mother     deceased  . Colon cancer  Mother   . Cancer Brother     multiple cancers, deceased.  . Leukemia Brother   . Liver cancer Brother   . Pancreatic cancer Brother    Scheduled Meds: . atorvastatin  80 mg Oral q1800  . cephALEXin  500 mg Oral Q12H  . diltiazem  240 mg Oral Daily  . docusate sodium  100 mg Oral BID  . feeding supplement (ENSURE ENLIVE)  237 mL Oral BID BM  . furosemide  40 mg Oral Daily  . gabapentin  100 mg Oral BID  . heparin  5,000 Units Subcutaneous Q8H  . insulin aspart  0-9 Units Subcutaneous TID WC  . insulin detemir  5 Units Subcutaneous QHS  . metFORMIN  1,000 mg Oral BID WC  . metoprolol tartrate  25 mg Oral BID  . multivitamin with minerals  1 tablet Oral Daily  . oxybutynin  5 mg Oral BID  . pantoprazole  40 mg Oral Daily  . predniSONE  1 mg Oral Q breakfast  . traZODone  100 mg Oral BID   Continuous Infusions: . sodium chloride    . sodium chloride 100 mL/hr at 10/05/16 0923   PRN Meds:.acetaminophen **OR** acetaminophen, albuterol, bisacodyl, HYDROcodone-acetaminophen, ondansetron **OR** ondansetron (ZOFRAN) IV, traZODone Medications Prior to Admission:  Prior to Admission medications   Medication Sig Start Date End Date Taking? Authorizing Provider  apixaban (ELIQUIS) 5 MG TABS tablet  Take 1 tablet (5 mg total) by mouth 2 (two) times daily. 01/28/16  Yes Minna Merritts, MD  atorvastatin (LIPITOR) 80 MG tablet Take 80 mg by mouth daily at 6 PM.  12/30/15  Yes Historical Provider, MD  diltiazem (CARDIZEM CD) 120 MG 24 hr capsule Take 1 capsule (120 mg total) by mouth daily. 08/10/16  Yes Megan Basta, MD  furosemide (LASIX) 40 MG tablet Take 1 tablet (40 mg total) by mouth 2 (two) times daily. Patient taking differently: Take 40 mg by mouth daily.  03/22/16  Yes Minna Merritts, MD  gabapentin (NEURONTIN) 100 MG capsule Take 1 capsule (100 mg total) by mouth daily. Patient taking differently: Take 200 mg by mouth 2 (two) times daily.  08/09/16 08/09/17 Yes Megan Basta, MD  insulin detemir (LEVEMIR) 100 UNIT/ML injection Inject 0.05 mLs (5 Units total) into the skin at bedtime. Patient taking differently: Inject 10 Units into the skin daily.  08/09/16  Yes Megan Basta, MD  lisinopril (PRINIVIL,ZESTRIL) 10 MG tablet Take 1 tablet (10 mg total) by mouth daily. 08/10/16  Yes Megan Basta, MD  metFORMIN (GLUCOPHAGE) 500 MG tablet Take 1,000 mg by mouth 2 (two) times daily.    Yes Historical Provider, MD  metoprolol tartrate (LOPRESSOR) 25 MG tablet Take 1 tablet (25 mg total) by mouth 2 (two) times daily. 08/11/16 11/09/16 Yes Christopher End, MD  Multiple Vitamin (MULTIVITAMIN WITH MINERALS) TABS tablet Take 1 tablet by mouth daily.   Yes Historical Provider, MD  omeprazole (PRILOSEC) 20 MG capsule Take 20 mg by mouth daily.   Yes Historical Provider, MD  oxybutynin (DITROPAN) 5 MG tablet Take 5 mg by mouth 2 (two) times daily.   Yes Historical Provider, MD  traZODone (DESYREL) 100 MG tablet Take 100 mg by mouth 2 (two) times daily. Takes with gabapentin.   Yes Historical Provider, MD  VENTOLIN HFA 108 (90 Base) MCG/ACT inhaler Inhale 2 puffs into the lungs every 6 (six) hours as needed.  12/13/15   Historical Provider, MD   No Known  Allergies Review of Systems  Constitutional: Positive for activity change, appetite change, fatigue and unexpected weight change.  Gastrointestinal: Positive for nausea.   Physical Exam  Constitutional: She is oriented to person, place, and time.  HENT:  Head: Normocephalic and atraumatic.  Cardiovascular: Regular rhythm and normal heart sounds.   Pulmonary/Chest: Effort normal and breath sounds normal.  Abdominal: Normal appearance. There is no tenderness.  Neurological: She is alert and oriented to person, place, and time.  Skin: Skin is warm and dry. There is pallor.  Psychiatric: She has a normal mood and affect. Her speech is normal and behavior is normal. Cognition and memory are normal.  Nursing note and vitals reviewed.  Vital Signs: BP (!) 117/56 (BP Location: Left Arm)   Pulse (!) 124   Temp 98.4 F (36.9 C) (Oral)   Resp 20   Ht 5' (1.524 m)   Wt 75.1 kg (165 lb 9.6 oz)   SpO2 95%   BMI 32.34 kg/m  Pain Assessment: PAINAD   Pain Score: 7    SpO2: SpO2: 95 % O2 Device:SpO2: 95 % O2 Flow Rate: .O2 Flow Rate (L/min): 2 L/min  IO: Intake/output summary:   Intake/Output Summary (Last 24 hours) at 10/05/16 1218 Last data filed at 10/05/16 [id number]  Gross per 24 hour  Intake              120 ml  Output              300 ml  Net             -180 ml    LBM: Last BM Date: 10/03/16 Baseline Weight: Weight: 76.7 kg (169 lb) Most recent weight: Weight: 75.1 kg (165 lb 9.6 oz)     Palliative Assessment/Data: PPS 50%   Flowsheet Rows   Flowsheet Row Most Recent Value  Intake Tab  Referral Department  Hospitalist  Unit at Time of Referral  Oncology Unit  Palliative Care Primary Diagnosis  Cancer  Date Notified  10/04/16  Palliative Care Type  New Palliative care  Reason for referral  Clarify Goals of Care  Date of Admission  10/01/16  Date first seen by Palliative Care  10/05/16  # of days Palliative referral response time  1 Day(s)  # of days IP prior to  Palliative referral  3  Clinical Assessment  Palliative Performance Scale Score  50%  Psychosocial & Spiritual Assessment  Palliative Care Outcomes  Patient/Family meeting held?  Yes  Who was at the meeting?  patient  Palliative Care Outcomes  Clarified goals of care, Provided psychosocial or spiritual support, ACP counseling assistance, Linked to palliative care logitudinal support      Time In: 1040 Time Out: 1150 Time Total: 65mn Greater than 50%  of this time was spent counseling and coordinating care related to the above assessment and plan.  Signed by:  MIhor Dow FNP-C Palliative Medicine Team  Phone: [phone]: [phone]  Please contact Palliative Medicine Team phone at [phone] questions and concerns.  For individual provider: See AShea Evans

## 2016-10-05 NOTE — Progress Notes (Signed)
[facility] [place] NAME: Megan Cisneros    MR#:  [id number]  DATE OF BIRTH:  1955/09/21  SUBJECTIVE:  CHIEF COMPLAINT:   Chief Complaint  Patient presents with  . Weakness     Weight loss and progressive weakness for last 1-2 months.  Found to have UTI.  On CT abd- have new findings of renal mass, metastasis to liver and retroperitoneal nodes.   Feels weak, but no other complains, tolerating diet, but not eating much.   Had MRI of spine ,and received Ativan for that- as a result was light drowsy so did not go for liver biopsy.   Awake today, have Tachycardia and A fib, BP stable, feels thirsty.  REVIEW OF SYSTEMS:  CONSTITUTIONAL: No fever,positive for fatigue or weakness.  EYES: No blurred or double vision.  EARS, NOSE, AND THROAT: No tinnitus or ear pain.  RESPIRATORY: No cough, shortness of breath, wheezing or hemoptysis.  CARDIOVASCULAR: No chest pain, orthopnea, edema.  GASTROINTESTINAL: No nausea, vomiting, diarrhea or abdominal pain.  GENITOURINARY: No dysuria, hematuria.  ENDOCRINE: No polyuria, nocturia,  HEMATOLOGY: No anemia, easy bruising or bleeding SKIN: No rash or lesion. MUSCULOSKELETAL: No joint pain or arthritis.   NEUROLOGIC: No tingling, numbness, weakness.  PSYCHIATRY: No anxiety or depression.   ROS  DRUG ALLERGIES:  No Known Allergies  VITALS:  Blood pressure (!) 117/56, pulse (!) 124, temperature 98.4 F (36.9 C), temperature source Oral, resp. rate 20, height 5' (1.524 m), weight 75.1 kg (165 lb 9.6 oz), SpO2 95 %.  PHYSICAL EXAMINATION:  GENERAL:  61 y.o.-year-old patient lying in the bed with no acute distress.  EYES: Pupils equal, round, reactive to light and accommodation. No scleral icterus. Extraocular muscles intact.  HEENT: Head atraumatic, normocephalic. Oropharynx and nasopharynx clear. Oral mucosa dry. NECK:  Supple, no jugular venous distention. No thyroid enlargement, no tenderness.  LUNGS:  Normal breath sounds bilaterally, no wheezing, rales,rhonchi or crepitation. No use of accessory muscles of respiration.  CARDIOVASCULAR: S1, S2 fast and regular. No murmurs, rubs, or gallops.  ABDOMEN: Soft, nontender, nondistended. Bowel sounds present. No organomegaly or mass.  EXTREMITIES: No pedal edema, cyanosis, or clubbing.  NEUROLOGIC: Cranial nerves II through XII are intact. Muscle strength 4/5 in all extremities. Sensation intact. Gait not checked.  PSYCHIATRIC: The patient is alert and oriented x 3.  SKIN: No obvious rash, lesion, or ulcer.   Physical Exam LABORATORY PANEL:   CBC  Recent Labs Lab 10/02/16 0458  WBC 9.2  HGB 12.0  HCT 37.3  PLT 228   ------------------------------------------------------------------------------------------------------------------  Chemistries   Recent Labs Lab 10/05/16 0412  NA 140  K 3.8  CL 96*  CO2 31  GLUCOSE 98  BUN 33*  CREATININE 1.11*  CALCIUM 8.2*   ------------------------------------------------------------------------------------------------------------------  Cardiac Enzymes  Recent Labs Lab 10/01/16 1408  TROPONINI 0.04*   ------------------------------------------------------------------------------------------------------------------  RADIOLOGY:  Ct Chest W Contrast  Result Date: 10/03/2016 CLINICAL DATA:  Metastatic breast cancer EXAM: CT CHEST, ABDOMEN, AND PELVIS WITH CONTRAST TECHNIQUE: Multidetector CT imaging of the chest, abdomen and pelvis was performed following the standard protocol during bolus administration of intravenous contrast. CONTRAST:  115m ISOVUE-300 IOPAMIDOL (ISOVUE-300) INJECTION 61% COMPARISON:  CT scan 10/01/2016 FINDINGS: CT CHEST FINDINGS Chest wall: Status post left mastectomy. No right-sided breast masses are identified. No axillary lymphadenopathy. 8 mm supraclavicular lymph node noted on image 2. There also enlarge subclavicular lymph nodes on the right side. The thyroid  gland is grossly  normal. Cardiovascular: The heart is within normal limits in size and stable. All stable dense atherosclerotic calcifications involving the aorta and branch vessels. No aneurysm or dissection. Three-vessel coronary artery calcifications are noted. Mediastinum/Nodes: There is extensive mediastinal and right hilar tumor. Large nodal masses throughout the mediastinum. The right paratracheal mass measures 4.8 x 3.9 cm on image 17. There is severe compression of the SVC but it is not completely occluded. Right hilar/ mediastinal mass on image number 25 measures 7.8 x 6.7 cm. Subcarinal nodal mass on image number 28 measures 34 x 30 mm. Lungs/Pleura: Lobulated right upper lobe lung mass on image number 65 measures 38 x 32 mm. Marked interstitial thickening on the right could suggest interstitial spread of tumor. The left lung is relatively clear. No acute pulmonary findings or worrisome pulmonary lesions. There are small bilateral pleural effusions. Musculoskeletal: Mixed lytic and sclerotic sternal lesions are suspicious for metastatic disease. No other obvious bone lesions. CT ABDOMEN PELVIS FINDINGS Hepatobiliary: Index lesion in the right lobe on image number 53 measures 8.6 x 4.7 cm. Index lesion in the lateral segment of the left hepatic lobe on image number 52 measures 5.4 x 4.1 cm. The gallbladder is grossly normal. A stable calcified gallstone is noted. Small amount of pericholecystic fluid. Pancreas: No mass, inflammation or ductal dilatation. Spleen: Normal size.  No focal lesions. Adrenals/Urinary Tract: Left adrenal gland lesions suspicious for metastasis. The right adrenal gland is normal. Complex enhancing left upper pole renal lesion measures 5.9 x 4.2 cm and is consistent with renal cell carcinoma. No renal vein invasion. Stomach/Bowel: The stomach, duodenum, small bowel and colon are grossly normal. No inflammatory changes, mass lesions or obstructive findings. Vascular/Lymphatic: Bulky  knee upper abdominal lymphadenopathy with a confluent mass in the portal region, gastrohepatic ligament and celiac axis. This measures 10.2 x 6.5 cm on image number 55. There is compression of the extrahepatic portal vein. There is also bulky retroperitoneal lymphadenopathy bilaterally. Advanced atherosclerotic calcifications involving the aorta and branch vessels. No aneurysm or dissection. Reproductive: The uterus and ovaries are unremarkable. Other: No pelvic mass or adenopathy. No free pelvic fluid collections. Moderate distention of the bladder. No inguinal mass or adenopathy. Musculoskeletal: No definite lytic or sclerotic bone lesions to suggest metastasis. IMPRESSION: 1. Bulky mediastinal and right hilar lymphadenopathy. Imminent SVC occlusion. 2. Large right upper lobe lung mass and surrounding interstitial thickening suggesting interstitial spread of tumor. 3. Diffuse hepatic metastatic disease and bulky upper abdominal and retroperitoneal lymphadenopathy. 4. Complex enhancing partially calcified left upper pole renal mass most likely renal cell carcinoma. 5. Suspect sternal metastatic disease. I do not see any other obvious bone lesions. 6. Advanced atherosclerotic calcifications involving the thoracic and abdominal aorta and branch vessels. Electronically Signed   By: Marijo Sanes M.D.   On: 10/03/2016 16:26   Mr Lumbar Spine Wo Contrast  Result Date: 10/04/2016 CLINICAL DATA:  Acute on chronic low back pain with a recent fall. Recently diagnosed metastatic disease. L4 compression fracture. EXAM: MRI LUMBAR SPINE WITHOUT CONTRAST TECHNIQUE: Multiplanar, multisequence MR imaging of the lumbar spine was performed. No intravenous contrast was administered. COMPARISON:  CT abdomen and pelvis 10/03/2016 FINDINGS: Segmentation:  Standard. Alignment:  Slight anterolisthesis of L4 on L5 due to facet disease. Vertebrae: L4 superior endplate fracture with 82% height loss centrally. No substantial marrow edema  is identified directly subjacent to the compressed portion of the endplate. There is some hyperintense STIR signal more posteriorly along the superior endplate and in the right  aspect of the vertebral body, however this is similar in appearance to multiple other levels and is felt to reflect an underlying osseous lesion rather than edema from an acute fracture. Bone marrow signal is diffusely abnormal throughout the visualized lower thoracic and lumbar spine as well as sacrum with replacement of the majority of normal fatty marrow signal and numerous STIR hyperintense lesions throughout. No gross paraspinal or epidural tumor is identified on this unenhanced study. Conus medullaris: Extends to the L1 level and appears normal. Paraspinal and other soft tissues: Liver metastases and retroperitoneal lymphadenopathy as described on recent CT. Disc levels: T12-L1:  Minimal disc bulging without stenosis. L1-2:  Negative. L2-3:  Negative. L3-4: Disc bulging, shallow central disc protrusion, and ligamentum flavum thickening result in mild right greater than left lateral recess stenosis, mild to moderate spinal stenosis, and no significant neural foraminal stenosis. L4-5: Disc bulging, shallow left paracentral disc protrusion, ligamentum flavum thickening, and facet arthrosis result in moderate spinal stenosis, mild right and moderate left lateral recess stenosis, and mild left neural foraminal stenosis. There are small facet joint effusions, right larger than left. L5-S1: Right paracentral disc protrusion results in minimal right lateral recess narrowing and slight posterior deflection of the adjacent right S1 nerve root. No spinal or neural foraminal stenosis. Mild facet arthrosis. IMPRESSION: 1. Diffuse osseous metastatic disease. 2. L4 superior endplate fracture with mild height loss favored to be chronic. 3. Moderate spinal stenosis and left greater than right lateral recess stenosis at L4-5. 4. Mild-to-moderate spinal  stenosis at L3-4. Electronically Signed   By: Logan Bores M.D.   On: 10/04/2016 12:04   Ct Abdomen Pelvis W Contrast  Result Date: 10/03/2016 CLINICAL DATA:  Metastatic breast cancer EXAM: CT CHEST, ABDOMEN, AND PELVIS WITH CONTRAST TECHNIQUE: Multidetector CT imaging of the chest, abdomen and pelvis was performed following the standard protocol during bolus administration of intravenous contrast. CONTRAST:  177m ISOVUE-300 IOPAMIDOL (ISOVUE-300) INJECTION 61% COMPARISON:  CT scan 10/01/2016 FINDINGS: CT CHEST FINDINGS Chest wall: Status post left mastectomy. No right-sided breast masses are identified. No axillary lymphadenopathy. 8 mm supraclavicular lymph node noted on image 2. There also enlarge subclavicular lymph nodes on the right side. The thyroid gland is grossly normal. Cardiovascular: The heart is within normal limits in size and stable. All stable dense atherosclerotic calcifications involving the aorta and branch vessels. No aneurysm or dissection. Three-vessel coronary artery calcifications are noted. Mediastinum/Nodes: There is extensive mediastinal and right hilar tumor. Large nodal masses throughout the mediastinum. The right paratracheal mass measures 4.8 x 3.9 cm on image 17. There is severe compression of the SVC but it is not completely occluded. Right hilar/ mediastinal mass on image number 25 measures 7.8 x 6.7 cm. Subcarinal nodal mass on image number 28 measures 34 x 30 mm. Lungs/Pleura: Lobulated right upper lobe lung mass on image number 65 measures 38 x 32 mm. Marked interstitial thickening on the right could suggest interstitial spread of tumor. The left lung is relatively clear. No acute pulmonary findings or worrisome pulmonary lesions. There are small bilateral pleural effusions. Musculoskeletal: Mixed lytic and sclerotic sternal lesions are suspicious for metastatic disease. No other obvious bone lesions. CT ABDOMEN PELVIS FINDINGS Hepatobiliary: Index lesion in the right lobe  on image number 53 measures 8.6 x 4.7 cm. Index lesion in the lateral segment of the left hepatic lobe on image number 52 measures 5.4 x 4.1 cm. The gallbladder is grossly normal. A stable calcified gallstone is noted. Small amount of pericholecystic  fluid. Pancreas: No mass, inflammation or ductal dilatation. Spleen: Normal size.  No focal lesions. Adrenals/Urinary Tract: Left adrenal gland lesions suspicious for metastasis. The right adrenal gland is normal. Complex enhancing left upper pole renal lesion measures 5.9 x 4.2 cm and is consistent with renal cell carcinoma. No renal vein invasion. Stomach/Bowel: The stomach, duodenum, small bowel and colon are grossly normal. No inflammatory changes, mass lesions or obstructive findings. Vascular/Lymphatic: Bulky knee upper abdominal lymphadenopathy with a confluent mass in the portal region, gastrohepatic ligament and celiac axis. This measures 10.2 x 6.5 cm on image number 55. There is compression of the extrahepatic portal vein. There is also bulky retroperitoneal lymphadenopathy bilaterally. Advanced atherosclerotic calcifications involving the aorta and branch vessels. No aneurysm or dissection. Reproductive: The uterus and ovaries are unremarkable. Other: No pelvic mass or adenopathy. No free pelvic fluid collections. Moderate distention of the bladder. No inguinal mass or adenopathy. Musculoskeletal: No definite lytic or sclerotic bone lesions to suggest metastasis. IMPRESSION: 1. Bulky mediastinal and right hilar lymphadenopathy. Imminent SVC occlusion. 2. Large right upper lobe lung mass and surrounding interstitial thickening suggesting interstitial spread of tumor. 3. Diffuse hepatic metastatic disease and bulky upper abdominal and retroperitoneal lymphadenopathy. 4. Complex enhancing partially calcified left upper pole renal mass most likely renal cell carcinoma. 5. Suspect sternal metastatic disease. I do not see any other obvious bone lesions. 6.  Advanced atherosclerotic calcifications involving the thoracic and abdominal aorta and branch vessels. Electronically Signed   By: Marijo Sanes M.D.   On: 10/03/2016 16:26    ASSESSMENT AND PLAN:   Active Problems:   Metastatic lung cancer (metastasis from lung to other site) ([place])   Pressure injury of skin   #19.61 year old female patient with generalized weakness and found to have extensive metastatic disease in the  liver, retroperitoneal nodes, left renal mass, also have mets in vertebra,  possible right lung cancer Onco consult appreciated , may need biopsy.  Requested US guided biopsy. Awaited today.  #2 UTI: Started on IV Rocephin, As per Urine culture- > 100,000 staphylococcus.   Change to oral keflex.  #3  for newly diagnosed metastatic cancer patient - Follow oncology recommendations.   Need US guided biopsy of liver.  #4.L4 superior endplate fracture, with nonacute appearance. This may be pathologic; Ortho consult.  physical therapy consult.   As per Dr.Menz, pt does not need kyphoplasty this time, unless need bone biopsy.   #5 Hx of A fib with Ch diastolic CHF   cont oral meds, cardizem + metoprolol.     Hold Eliquis, as will need Biopsy per oncology. Held since Saturday.    Now tachycardiac- likely some dehydration in setting of keeping NPO.   IV fluids and increased dose of cardizem oral.  #6 Diabetes   on metformin, insulin sliding scale coverage.   Hold metformin until 10/05/16 due to CT with contrast.  #7 Hypertension   Metoprolol.   Stopped lisinopril, and increased cardizem for better HR control.  PT suggest SNF. Will monitor today in hospital- as she have Tachycardia.  All the records are reviewed and case discussed with Care Management/Social Workerr. Management plans discussed with the patient, family and they are in agreement.  CODE STATUS: Full.  TOTAL TIME TAKING CARE OF THIS PATIENT: 35 minutes.     POSSIBLE D/C IN 1-2 DAYS, DEPENDING ON  CLINICAL CONDITION.   Vaughan Basta M.D on 10/05/2016   Between 7am to 6pm - Pager - [phone]  After 6pm go to [email or web address] -  password [email or web address] [facility]  Office  [phone]  CC: Primary care physician; Donnie Coffin, MD  Note: This dictation was prepared with Dragon dictation along with smaller phrase technology. Any transcriptional errors that result from this process are unintentional.

## 2016-10-05 NOTE — Progress Notes (Signed)
PT Cancellation Note  Patient Details Name: Megan Cisneros MRN: [id number] DOB: January 29, 1956   Cancelled Treatment:    Reason Eval/Treat Not Completed: Other (comment). Pt currently tachycardic this morning with HR ranging from 122-124bpm. Not appropriate for physical therapy at this time. Will hold therapy until HR in normal range.   Suhaylah Wampole 10/05/2016, 9:50 AM Greggory Stallion, PT, DPT [phone]

## 2016-10-06 ENCOUNTER — Inpatient Hospital Stay: Payer: Medicare Other

## 2016-10-06 ENCOUNTER — Ambulatory Visit
Admission: RE | Admit: 2016-10-06 | Discharge: 2016-10-06 | Disposition: A | Discharge status: Home / Self Care | Payer: Medicare Other | Source: Ambulatory Visit | Attending: Radiation Oncology | Admitting: Radiation Oncology

## 2016-10-06 DIAGNOSIS — C799 Secondary malignant neoplasm of unspecified site: Secondary | ICD-10-CM

## 2016-10-06 DIAGNOSIS — C3490 Malignant neoplasm of unspecified part of unspecified bronchus or lung: Secondary | ICD-10-CM | POA: Insufficient documentation

## 2016-10-06 DIAGNOSIS — Z510 Encounter for antineoplastic radiation therapy: Secondary | ICD-10-CM | POA: Insufficient documentation

## 2016-10-06 DIAGNOSIS — I4891 Unspecified atrial fibrillation: Secondary | ICD-10-CM

## 2016-10-06 DIAGNOSIS — I871 Compression of vein: Secondary | ICD-10-CM

## 2016-10-06 DIAGNOSIS — I959 Hypotension, unspecified: Secondary | ICD-10-CM

## 2016-10-06 DIAGNOSIS — I9589 Other hypotension: Secondary | ICD-10-CM

## 2016-10-06 LAB — GLUCOSE, CAPILLARY
GLUCOSE-CAPILLARY: 166 mg/dL — AB (ref 65–99)
GLUCOSE-CAPILLARY: 148 mg/dL — AB (ref 65–99)
Glucose-Capillary: 123 mg/dL — ABNORMAL HIGH (ref 65–99)
Glucose-Capillary: 82 mg/dL (ref 65–99)
Glucose-Capillary: 78 mg/dL (ref 65–99)

## 2016-10-06 LAB — BASIC METABOLIC PANEL
ANION GAP: 11 (ref 5–15)
BUN: 43 mg/dL — ABNORMAL HIGH (ref 6–20)
CALCIUM: 7.6 mg/dL — AB (ref 8.9–10.3)
CO2: 27 mmol/L (ref 22–32)
Chloride: 100 mmol/L — ABNORMAL LOW (ref 101–111)
Creatinine, Ser: 1.64 mg/dL — ABNORMAL HIGH (ref 0.44–1.00)
GFR calc Af Amer: 38 mL/min — ABNORMAL LOW (ref >60)
GFR, EST NON AFRICAN AMERICAN: 33 mL/min — AB (ref >60)
Glucose, Bld: 165 mg/dL — ABNORMAL HIGH (ref 65–99)
Potassium: 4.1 mmol/L (ref 3.5–5.1)
Sodium: 138 mmol/L (ref 135–145)

## 2016-10-06 LAB — CBC
HCT: 30.7 % — ABNORMAL LOW (ref 35.0–47.0)
HEMOGLOBIN: 10.4 g/dL — AB (ref 12.0–16.0)
MCH: 29.3 pg (ref 26.0–34.0)
MCHC: 34 g/dL (ref 32.0–36.0)
MCV: 86.3 fL (ref 80.0–100.0)
Platelets: 226 10*3/uL (ref 150–440)
RBC: 3.56 MIL/uL — AB (ref 3.80–5.20)
RDW: 18.8 % — ABNORMAL HIGH (ref 11.5–14.5)
WBC: 9.4 10*3/uL (ref 3.6–11.0)

## 2016-10-06 LAB — MAGNESIUM: MAGNESIUM: 1.4 mg/dL — AB (ref 1.7–2.4)

## 2016-10-06 LAB — MRSA PCR SCREENING: MRSA BY PCR: NEGATIVE

## 2016-10-06 MED ORDER — METOPROLOL TARTRATE 25 MG PO TABS: 25.0000 mg | ORAL_TABLET | Freq: Once | ORAL | Status: DC

## 2016-10-06 MED ORDER — APIXABAN 5 MG PO TABS
5.0000 mg | ORAL_TABLET | Freq: Two times a day (BID) | ORAL | Status: DC
  Administered 2016-10-06 – 2016-10-09 (×7): 5 mg via ORAL
  Filled 2016-10-06 (×7): qty 1

## 2016-10-06 MED ORDER — SODIUM CHLORIDE 0.9 % IV BOLUS (SEPSIS)
500.0000 mL | Freq: Once | INTRAVENOUS | Status: AC
  Administered 2016-10-06: 1000 mL via INTRAVENOUS

## 2016-10-06 MED ORDER — DILTIAZEM HCL ER COATED BEADS 180 MG PO CP24: 300.0000 mg | ORAL_CAPSULE | Freq: Every day | ORAL | Status: DC

## 2016-10-06 MED ORDER — DIGOXIN 0.25 MG/ML IJ SOLN
0.5000 mg | Freq: Once | INTRAMUSCULAR | Status: AC
  Administered 2016-10-06: 11:00:00 0.5 mg via INTRAVENOUS
  Filled 2016-10-06: qty 2

## 2016-10-06 MED ORDER — MAGNESIUM SULFATE 2 GM/50ML IV SOLN
2.0000 g | Freq: Once | INTRAVENOUS | Status: AC
  Administered 2016-10-07: 2 g via INTRAVENOUS
  Filled 2016-10-06: qty 50

## 2016-10-06 MED ORDER — SODIUM CHLORIDE 0.9 % IV BOLUS (SEPSIS)
500.0000 mL | Freq: Once | INTRAVENOUS | Status: AC
  Administered 2016-10-06: 10:00:00 500 mL via INTRAVENOUS

## 2016-10-06 NOTE — Clinical Social Work Note (Signed)
Clinical Social Work Assessment  Patient Details  Name: Megan Cisneros MRN: [id number] Date of Birth: 12-Sep-1955  Date of referral:  10/06/16               Reason for consult:  Facility Placement                Permission sought to share information with:  Facility Sport and exercise psychologist, Family Supports Permission granted to share information::  Yes, Verbal Permission Granted  Name::     Paget,Francine Daughter [phone]   Agency::  SNF admissions  Relationship::     Contact Information:     Housing/Transportation Living arrangements for the past 2 months:  Single Family Home Source of Information:  Patient, Adult Children Patient Interpreter Needed:  None Criminal Activity/Legal Involvement Pertinent to Current Situation/Hospitalization:  No - Comment as needed Significant Relationships:  Adult Children Lives with:  Adult Children Do you feel safe going back to the place where you live?    Need for family participation in patient care:  No (Coment)  Care giving concerns:  Patient feels she needs short term rehab before she is able to return back home.   Social Worker assessment / plan:  Patient is a 61 year old female who lives with her daughter, she is alert and oriented x4 and able to express her feelings.  Patient states she has been to rehab in the past and was familiar with what to expect.  CSW explained to patient the role of CSW and process for trying to find SNF placement.  Patient expressed understanding of process and gave CSW permission to begin bed search process in [facility].  Employment status:  Disabled (Comment on whether or not currently receiving Disability), Retired Forensic scientist:  Medicare PT Recommendations:  [place] / Referral to community resources:  [place]  Patient/Family's Response to care:  Patient is in agreement to going to SNF for short term rehab.  Patient/Family's Understanding of and  Emotional Response to Diagnosis, Current Treatment, and Prognosis:  Patient states she is hopeful that she will not have to be in SNF for very long, she is motivated to work hard in order to get discharged back home.  Emotional Assessment Appearance:  Appears stated age Attitude/Demeanor/Rapport:    Affect (typically observed):  Appropriate, Calm, Stable, Pleasant Orientation:  Oriented to Self, Oriented to Place, Oriented to  Time, Oriented to Situation Alcohol / Substance use:  Not Applicable Psych involvement (Current and /or in the community):  No (Comment)  Discharge Needs  Concerns to be addressed:  Lack of Support Readmission within the last 30 days:  No Current discharge risk:  Lack of support system Barriers to Discharge:  No Barriers Identified   Ross Ludwig, LCSWA 10/06/2016, 1:08 PM

## 2016-10-06 NOTE — Plan of Care (Signed)
Problem: Safety: Goal: Ability to remain free from injury will improve Outcome: Progressing Remained free from injury or fall. Fall precautions in place. Kept safe and comfortable.

## 2016-10-06 NOTE — Progress Notes (Signed)
[facility] CONSULT NOTE  Patient Care Team: Donnie Coffin, MD as PCP - General (Family Medicine) Minna Merritts, MD as Consulting Physician (Cardiology)  CHIEF COMPLAINTS/PURPOSE OF CONSULTATION: Possible metastatic disease  CC: pt up in bed having break fast; was evaluated by radiation oncology for SVCompression. Continues to have shortness of breath. No worsening cough  ROS: no diarrhea. No abdominal pain.   MEDICAL HISTORY:  Past Medical History:  Diagnosis Date  . Atrial flutter ([place])    a. Dx 11/2015 -->CHA2DS2VASc = 7-->Eliquis '5mg'$  BID.  Marland Kitchen Breast cancer ([place])   . Chronic diastolic CHF (congestive heart failure) ([place])    a. 11/2015 Echo: EF 60-65%, no rwma, mild MR, mildly dil LA, nl RV fxn.  . Chronic Dyspnea on Exertion    a. 2004 Stress test prior to LE bypass->reportedly nl.  . Diabetes mellitus without complication ([place])    a. Dx as borderline diabetic in the 80's-->progressed over the years.  . Essential hypertension   . Lower extremity edema   . Morbid obesity ([place])   . PVC's (premature ventricular contractions)   . PVD (peripheral vascular disease) ([place])    a. 2004 s/p RLE bypass - complicated by recurrent infection.  . Stroke [facility])    a. TIA x 2;  b. Stroke in 2007 in [place].    SURGICAL HISTORY: Past Surgical History:  Procedure Laterality Date  . Cataract Surgery Right    03/2014  . CESAREAN SECTION    . ELECTROPHYSIOLOGIC STUDY N/A 08/09/2016   Procedure: CARDIOVERSION;  Surgeon: Wellington Hampshire, MD;  Location: [facility] ORS;  Service: Cardiovascular;  Laterality: N/A;  . LEG SURGERY    . MASTECTOMY Left   . 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 Hx of smoking quit 20 years ago. Patient used to live in [place]. Currently lives in [place] with her daughter.   Social History   Social History  . Marital status: Widowed    Spouse name: N/A  . Number of children: N/A  .  Years of education: N/A   Occupational History  . Not on file.   Social History Main Topics  . Smoking status: Former Research scientist (life sciences)  . Smokeless tobacco: Never Used  . Alcohol use No  . Drug use: No  . Sexual activity: Not on file   Other Topics Concern  . Not on file   Social History Narrative   Lives in [place] by herself.  Originally from [place], [place], later moved to [place] and then [place].  Has been in [place] for 8 yrs.  Family nearby.  Does not routinely exercise.   Has a cane and a walker    FAMILY HISTORY: Family History  Problem Relation Age of Onset  . Heart attack Father     died in his early 16's.  . Diabetes Father   . Cancer Mother     deceased  . Colon cancer Mother   . Cancer Brother     multiple cancers, deceased.  . Leukemia Brother   . Liver cancer Brother   . Pancreatic cancer Brother     ALLERGIES:  has No Known Allergies.  MEDICATIONS:  Current Facility-Administered Medications  Medication Dose Route Frequency Provider Last Rate Last Dose  . 0.9 %  sodium chloride infusion   Intravenous Continuous Arne Cleveland, MD      . 0.9 %  sodium chloride infusion   Intravenous Continuous Vaibhavkumar  Anselm Jungling, MD 100 mL/hr at 10/05/16 1944    . acetaminophen (TYLENOL) tablet 650 mg  650 mg Oral Q6H PRN Epifanio Lesches, MD       Or  . acetaminophen (TYLENOL) suppository 650 mg  650 mg Rectal Q6H PRN Epifanio Lesches, MD      . albuterol (PROVENTIL) (2.5 MG/3ML) 0.083% nebulizer solution 2.5 mg  2.5 mg Inhalation Q4H PRN Epifanio Lesches, MD      . atorvastatin (LIPITOR) tablet 80 mg  80 mg Oral q1800 Epifanio Lesches, MD   80 mg at 10/05/16 2156  . bisacodyl (DULCOLAX) EC tablet 5 mg  5 mg Oral Daily PRN Epifanio Lesches, MD      . cephALEXin (KEFLEX) capsule 500 mg  500 mg Oral Q12H Vaughan Basta, MD   500 mg at 10/05/16 2156  . diltiazem (CARDIZEM CD) 24 hr capsule 300 mg  300 mg Oral Daily Vaughan Basta, MD      . docusate sodium (COLACE)  capsule 100 mg  100 mg Oral BID Epifanio Lesches, MD   100 mg at 10/05/16 2157  . feeding supplement (ENSURE ENLIVE) (ENSURE ENLIVE) liquid 237 mL  237 mL Oral BID BM Vaughan Basta, MD   237 mL at 10/03/16 1010  . furosemide (LASIX) tablet 40 mg  40 mg Oral Daily Vaughan Basta, MD   40 mg at 10/05/16 0903  . gabapentin (NEURONTIN) capsule 100 mg  100 mg Oral BID Alexis Hugelmeyer, DO   100 mg at 10/05/16 2156  . heparin injection 5,000 Units  5,000 Units Subcutaneous Q8H Epifanio Lesches, MD   5,000 Units at 10/05/16 2157  . HYDROcodone-acetaminophen (NORCO/VICODIN) 5-325 MG per tablet 1-2 tablet  1-2 tablet Oral Q4H PRN Epifanio Lesches, MD   1 tablet at 10/05/16 0606  . insulin aspart (novoLOG) injection 0-9 Units  0-9 Units Subcutaneous TID WC Vaughan Basta, MD   1 Units at 10/04/16 1242  . insulin detemir (LEVEMIR) injection 5 Units  5 Units Subcutaneous QHS Epifanio Lesches, MD   5 Units at 10/05/16 2157  . metFORMIN (GLUCOPHAGE) tablet 1,000 mg  1,000 mg Oral BID WC Epifanio Lesches, MD   1,000 mg at 10/05/16 2157  . metoprolol tartrate (LOPRESSOR) tablet 25 mg  25 mg Oral BID Epifanio Lesches, MD   25 mg at 10/05/16 2157  . metoprolol tartrate (LOPRESSOR) tablet 25 mg  25 mg Oral Once Vaughan Basta, MD      . multivitamin with minerals tablet 1 tablet  1 tablet Oral Daily Epifanio Lesches, MD   1 tablet at 10/05/16 0903  . ondansetron (ZOFRAN) tablet 4 mg  4 mg Oral Q6H PRN Epifanio Lesches, MD       Or  . ondansetron (ZOFRAN) injection 4 mg  4 mg Intravenous Q6H PRN Epifanio Lesches, MD   4 mg at 10/02/16 1314  . oxybutynin (DITROPAN) tablet 5 mg  5 mg Oral BID Epifanio Lesches, MD   5 mg at 10/05/16 2157  . pantoprazole (PROTONIX) EC tablet 40 mg  40 mg Oral Daily Epifanio Lesches, MD   40 mg at 10/05/16 0903  . predniSONE (DELTASONE) tablet 1 mg  1 mg Oral Q breakfast Vaughan Basta, MD   1 mg at 10/05/16 0903  .  traZODone (DESYREL) tablet 100 mg  100 mg Oral BID Epifanio Lesches, MD   100 mg at 10/05/16 2158  . traZODone (DESYREL) tablet 25 mg  25 mg Oral QHS PRN Epifanio Lesches, MD          .  PHYSICAL EXAMINATION:  Vitals:   10/06/16 0447 10/06/16 0629  BP: (!) 88/53 (!) 113/49  Pulse: (!) 118 (!) 122  Resp: 20   Temp: 97.4 F (36.3 C)    Filed Weights   10/04/16 0500 10/05/16 0500 10/06/16 0500  Weight: 169 lb 1.6 oz (76.7 kg) 165 lb 9.6 oz (75.1 kg) 168 lb 10.4 oz (76.5 kg)    GENERAL: Well-nourished well-developed; Sleepy; no distress and comfortable.   She is alone.  EYES: no pallor or icterus OROPHARYNX: no thrush or ulceration. Poor dentition NECK: supple, no masses felt LYMPH:  no palpable lymphadenopathy in the cervical, axillary or inguinal regions LUNGS: decreased breath sounds to auscultation at bases and  No wheeze or crackles HEART/CVS: regular rate & rhythm and no murmurs; No lower extremity edema ABDOMEN: abdomen soft, non-tender and normal bowel sounds Musculoskeletal:no cyanosis of digits and no clubbing  PSYCH: sleepy. NEURO:unable to examine SKIN:  no rashes or significant lesions  LABORATORY DATA:  I have reviewed the data as listed Lab Results  Component Value Date   WBC 9.2 10/02/2016   HGB 12.0 10/02/2016   HCT 37.3 10/02/2016   MCV 87.2 10/02/2016   PLT 228 10/02/2016    Recent Labs  10/01/16 1408 10/02/16 0458 10/05/16 0412  NA 135 136 140  K 4.0 3.7 3.8  CL 91* 94* 96*  CO2 31 32 31  GLUCOSE 385* 361* 98  BUN 38* 37* 33*  CREATININE 1.14* 1.08* 1.11*  CALCIUM 9.0 8.6* 8.2*  GFRNONAA 50* 54* 52*  GFRAA 58* >60 >60    RADIOGRAPHIC STUDIES: I have personally reviewed the radiological images as listed and agreed with the findings in the report. Dg Chest 1 View  Result Date: 10/01/2016 CLINICAL DATA:  Acute onset of generalized weakness. Initial encounter. EXAM: CHEST 1 VIEW COMPARISON:  Chest radiographs performed 08/05/2016 and  12/11/2015 FINDINGS: Enlarging dense right hilar opacity is suspicious for malignancy. The left lung appears relatively clear. No pleural effusion or pneumothorax is seen. There is elevation of the right hemidiaphragm. The cardiomediastinal silhouette is normal in size. No acute osseous abnormalities are identified. IMPRESSION: Enlarging dense right hilar opacity is suspicious for malignancy. CT of the chest with contrast is recommended for further evaluation. Electronically Signed   By: Garald Balding M.D.   On: 10/01/2016 17:35   Dg Lumbar Spine 2-3 Views  Result Date: 10/02/2016 CLINICAL DATA:  Back pain and weakness for 2 days. EXAM: LUMBAR SPINE - 2-3 VIEW COMPARISON:  CT scan October 01, 2016 FINDINGS: Scalloping of the L4 superior endplate was better assessed on yesterday's CT scan and is unchanged. Degenerative changes in the lower thoracic spine. No other interval changes or bony abnormalities. IMPRESSION: No change since yesterday's CT scan. Continued scalloping of the L4 superior endplate. Electronically Signed   By: Dorise Bullion III M.D   On: 10/02/2016 17:33   Ct Head Wo Contrast  Result Date: 10/01/2016 CLINICAL DATA:  Weakness for 2 weeks EXAM: CT HEAD WITHOUT CONTRAST TECHNIQUE: Contiguous axial images were obtained from the base of the skull through the vertex without intravenous contrast. COMPARISON:  None. FINDINGS: Brain: No evidence of acute infarction, hemorrhage, extra-axial collection, ventriculomegaly, or mass effect. Generalized cerebral atrophy. Periventricular white matter low attenuation likely secondary to microangiopathy. Vascular: Cerebrovascular atherosclerotic calcifications are noted. Skull: Negative for fracture or focal lesion. Sinuses/Orbits: Visualized portions of the orbits are unremarkable. Visualized portions of the paranasal sinuses and mastoid air cells are unremarkable. Other: None. IMPRESSION: 1.  No acute intracranial pathology. 2. Chronic microvascular disease  and cerebral atrophy. Electronically Signed   By: Kathreen Devoid   On: 10/01/2016 17:24   Ct Chest W Contrast  Result Date: 10/03/2016 CLINICAL DATA:  Metastatic breast cancer EXAM: CT CHEST, ABDOMEN, AND PELVIS WITH CONTRAST TECHNIQUE: Multidetector CT imaging of the chest, abdomen and pelvis was performed following the standard protocol during bolus administration of intravenous contrast. CONTRAST:  118m ISOVUE-300 IOPAMIDOL (ISOVUE-300) INJECTION 61% COMPARISON:  CT scan 10/01/2016 FINDINGS: CT CHEST FINDINGS Chest wall: Status post left mastectomy. No right-sided breast masses are identified. No axillary lymphadenopathy. 8 mm supraclavicular lymph node noted on image 2. There also enlarge subclavicular lymph nodes on the right side. The thyroid gland is grossly normal. Cardiovascular: The heart is within normal limits in size and stable. All stable dense atherosclerotic calcifications involving the aorta and branch vessels. No aneurysm or dissection. Three-vessel coronary artery calcifications are noted. Mediastinum/Nodes: There is extensive mediastinal and right hilar tumor. Large nodal masses throughout the mediastinum. The right paratracheal mass measures 4.8 x 3.9 cm on image 17. There is severe compression of the SVC but it is not completely occluded. Right hilar/ mediastinal mass on image number 25 measures 7.8 x 6.7 cm. Subcarinal nodal mass on image number 28 measures 34 x 30 mm. Lungs/Pleura: Lobulated right upper lobe lung mass on image number 65 measures 38 x 32 mm. Marked interstitial thickening on the right could suggest interstitial spread of tumor. The left lung is relatively clear. No acute pulmonary findings or worrisome pulmonary lesions. There are small bilateral pleural effusions. Musculoskeletal: Mixed lytic and sclerotic sternal lesions are suspicious for metastatic disease. No other obvious bone lesions. CT ABDOMEN PELVIS FINDINGS Hepatobiliary: Index lesion in the right lobe on image  number 53 measures 8.6 x 4.7 cm. Index lesion in the lateral segment of the left hepatic lobe on image number 52 measures 5.4 x 4.1 cm. The gallbladder is grossly normal. A stable calcified gallstone is noted. Small amount of pericholecystic fluid. Pancreas: No mass, inflammation or ductal dilatation. Spleen: Normal size.  No focal lesions. Adrenals/Urinary Tract: Left adrenal gland lesions suspicious for metastasis. The right adrenal gland is normal. Complex enhancing left upper pole renal lesion measures 5.9 x 4.2 cm and is consistent with renal cell carcinoma. No renal vein invasion. Stomach/Bowel: The stomach, duodenum, small bowel and colon are grossly normal. No inflammatory changes, mass lesions or obstructive findings. Vascular/Lymphatic: Bulky knee upper abdominal lymphadenopathy with a confluent mass in the portal region, gastrohepatic ligament and celiac axis. This measures 10.2 x 6.5 cm on image number 55. There is compression of the extrahepatic portal vein. There is also bulky retroperitoneal lymphadenopathy bilaterally. Advanced atherosclerotic calcifications involving the aorta and branch vessels. No aneurysm or dissection. Reproductive: The uterus and ovaries are unremarkable. Other: No pelvic mass or adenopathy. No free pelvic fluid collections. Moderate distention of the bladder. No inguinal mass or adenopathy. Musculoskeletal: No definite lytic or sclerotic bone lesions to suggest metastasis. IMPRESSION: 1. Bulky mediastinal and right hilar lymphadenopathy. Imminent SVC occlusion. 2. Large right upper lobe lung mass and surrounding interstitial thickening suggesting interstitial spread of tumor. 3. Diffuse hepatic metastatic disease and bulky upper abdominal and retroperitoneal lymphadenopathy. 4. Complex enhancing partially calcified left upper pole renal mass most likely renal cell carcinoma. 5. Suspect sternal metastatic disease. I do not see any other obvious bone lesions. 6. Advanced  atherosclerotic calcifications involving the thoracic and abdominal aorta and branch vessels. Electronically Signed  By: Marijo Sanes M.D.   On: 10/03/2016 16:26   Mr Lumbar Spine Wo Contrast  Result Date: 10/04/2016 CLINICAL DATA:  Acute on chronic low back pain with a recent fall. Recently diagnosed metastatic disease. L4 compression fracture. EXAM: MRI LUMBAR SPINE WITHOUT CONTRAST TECHNIQUE: Multiplanar, multisequence MR imaging of the lumbar spine was performed. No intravenous contrast was administered. COMPARISON:  CT abdomen and pelvis 10/03/2016 FINDINGS: Segmentation:  Standard. Alignment:  Slight anterolisthesis of L4 on L5 due to facet disease. Vertebrae: L4 superior endplate fracture with 16% height loss centrally. No substantial marrow edema is identified directly subjacent to the compressed portion of the endplate. There is some hyperintense STIR signal more posteriorly along the superior endplate and in the right aspect of the vertebral body, however this is similar in appearance to multiple other levels and is felt to reflect an underlying osseous lesion rather than edema from an acute fracture. Bone marrow signal is diffusely abnormal throughout the visualized lower thoracic and lumbar spine as well as sacrum with replacement of the majority of normal fatty marrow signal and numerous STIR hyperintense lesions throughout. No gross paraspinal or epidural tumor is identified on this unenhanced study. Conus medullaris: Extends to the L1 level and appears normal. Paraspinal and other soft tissues: Liver metastases and retroperitoneal lymphadenopathy as described on recent CT. Disc levels: T12-L1:  Minimal disc bulging without stenosis. L1-2:  Negative. L2-3:  Negative. L3-4: Disc bulging, shallow central disc protrusion, and ligamentum flavum thickening result in mild right greater than left lateral recess stenosis, mild to moderate spinal stenosis, and no significant neural foraminal stenosis.  L4-5: Disc bulging, shallow left paracentral disc protrusion, ligamentum flavum thickening, and facet arthrosis result in moderate spinal stenosis, mild right and moderate left lateral recess stenosis, and mild left neural foraminal stenosis. There are small facet joint effusions, right larger than left. L5-S1: Right paracentral disc protrusion results in minimal right lateral recess narrowing and slight posterior deflection of the adjacent right S1 nerve root. No spinal or neural foraminal stenosis. Mild facet arthrosis. IMPRESSION: 1. Diffuse osseous metastatic disease. 2. L4 superior endplate fracture with mild height loss favored to be chronic. 3. Moderate spinal stenosis and left greater than right lateral recess stenosis at L4-5. 4. Mild-to-moderate spinal stenosis at L3-4. Electronically Signed   By: Logan Bores M.D.   On: 10/04/2016 12:04   Ct Abdomen Pelvis W Contrast  Result Date: 10/03/2016 CLINICAL DATA:  Metastatic breast cancer EXAM: CT CHEST, ABDOMEN, AND PELVIS WITH CONTRAST TECHNIQUE: Multidetector CT imaging of the chest, abdomen and pelvis was performed following the standard protocol during bolus administration of intravenous contrast. CONTRAST:  188m ISOVUE-300 IOPAMIDOL (ISOVUE-300) INJECTION 61% COMPARISON:  CT scan 10/01/2016 FINDINGS: CT CHEST FINDINGS Chest wall: Status post left mastectomy. No right-sided breast masses are identified. No axillary lymphadenopathy. 8 mm supraclavicular lymph node noted on image 2. There also enlarge subclavicular lymph nodes on the right side. The thyroid gland is grossly normal. Cardiovascular: The heart is within normal limits in size and stable. All stable dense atherosclerotic calcifications involving the aorta and branch vessels. No aneurysm or dissection. Three-vessel coronary artery calcifications are noted. Mediastinum/Nodes: There is extensive mediastinal and right hilar tumor. Large nodal masses throughout the mediastinum. The right  paratracheal mass measures 4.8 x 3.9 cm on image 17. There is severe compression of the SVC but it is not completely occluded. Right hilar/ mediastinal mass on image number 25 measures 7.8 x 6.7 cm. Subcarinal nodal mass on image  number 28 measures 34 x 30 mm. Lungs/Pleura: Lobulated right upper lobe lung mass on image number 65 measures 38 x 32 mm. Marked interstitial thickening on the right could suggest interstitial spread of tumor. The left lung is relatively clear. No acute pulmonary findings or worrisome pulmonary lesions. There are small bilateral pleural effusions. Musculoskeletal: Mixed lytic and sclerotic sternal lesions are suspicious for metastatic disease. No other obvious bone lesions. CT ABDOMEN PELVIS FINDINGS Hepatobiliary: Index lesion in the right lobe on image number 53 measures 8.6 x 4.7 cm. Index lesion in the lateral segment of the left hepatic lobe on image number 52 measures 5.4 x 4.1 cm. The gallbladder is grossly normal. A stable calcified gallstone is noted. Small amount of pericholecystic fluid. Pancreas: No mass, inflammation or ductal dilatation. Spleen: Normal size.  No focal lesions. Adrenals/Urinary Tract: Left adrenal gland lesions suspicious for metastasis. The right adrenal gland is normal. Complex enhancing left upper pole renal lesion measures 5.9 x 4.2 cm and is consistent with renal cell carcinoma. No renal vein invasion. Stomach/Bowel: The stomach, duodenum, small bowel and colon are grossly normal. No inflammatory changes, mass lesions or obstructive findings. Vascular/Lymphatic: Bulky knee upper abdominal lymphadenopathy with a confluent mass in the portal region, gastrohepatic ligament and celiac axis. This measures 10.2 x 6.5 cm on image number 55. There is compression of the extrahepatic portal vein. There is also bulky retroperitoneal lymphadenopathy bilaterally. Advanced atherosclerotic calcifications involving the aorta and branch vessels. No aneurysm or dissection.  Reproductive: The uterus and ovaries are unremarkable. Other: No pelvic mass or adenopathy. No free pelvic fluid collections. Moderate distention of the bladder. No inguinal mass or adenopathy. Musculoskeletal: No definite lytic or sclerotic bone lesions to suggest metastasis. IMPRESSION: 1. Bulky mediastinal and right hilar lymphadenopathy. Imminent SVC occlusion. 2. Large right upper lobe lung mass and surrounding interstitial thickening suggesting interstitial spread of tumor. 3. Diffuse hepatic metastatic disease and bulky upper abdominal and retroperitoneal lymphadenopathy. 4. Complex enhancing partially calcified left upper pole renal mass most likely renal cell carcinoma. 5. Suspect sternal metastatic disease. I do not see any other obvious bone lesions. 6. Advanced atherosclerotic calcifications involving the thoracic and abdominal aorta and branch vessels. Electronically Signed   By: Marijo Sanes M.D.   On: 10/03/2016 16:26   US Biopsy  Result Date: 10/05/2016 INDICATION: 61 year old female with evidence of metastatic disease in the chest, abdomen and bones. Tissue diagnosis is needed. EXAM: ULTRASOUND-GUIDED LIVER LESION BIOPSY MEDICATIONS: None. ANESTHESIA/SEDATION: No sedation was used. The patient's vital signs were monitored continuously by radiology nursing throughout the procedure under my direct supervision. FLUOROSCOPY TIME:  None COMPLICATIONS: None immediate. PROCEDURE: Informed written consent was obtained from the patient after a thorough discussion of the procedural risks, benefits and alternatives. All questions were addressed. A timeout was performed prior to the initiation of the procedure. Liver was evaluated with ultrasound. There are subtle lesions scattered throughout the liver. A lesion in the right hepatic lobe was selected for biopsy. Abdomen was prepped and draped in sterile fashion. Skin and soft tissues were anesthetized with 1% lidocaine. Using ultrasound guidance, 17 gauge  needle was directed into the right hepatic lobe lesion. A total of 4 core biopsies were obtained with an 18 gauge core device. Three of the core samples were adequate and placed in formalin. 17 gauge needle was removed without complication. Bandage placed over the puncture site. FINDINGS: Subtle lesions throughout the liver. Needle position confirmed within a right hepatic lesion. No significant bleeding or  hematoma formation following the core biopsies. IMPRESSION: Successful ultrasound-guided core biopsy of a right hepatic lesion. Electronically Signed   By: Markus Daft M.D.   On: 10/05/2016 17:27   Ct Renal Stone Study  Result Date: 10/01/2016 CLINICAL DATA:  Flank pain and hematuria. EXAM: CT ABDOMEN AND PELVIS WITHOUT CONTRAST TECHNIQUE: Multidetector CT imaging of the abdomen and pelvis was performed following the standard protocol without IV contrast. COMPARISON:  None. FINDINGS: Lower chest:  No contributory findings. Hepatobiliary: The liver is nearly replaced by innumerable masses.Cholelithiasis. No evidence of acute cholecystitis. Pancreas: The head and neck is indistinguishable from bulky retroperitoneal adenopathy. Spleen: Unremarkable. Adrenals/Urinary Tract: Negative adrenals. 4 cm mass exophytic from the upper pole left kidney. Small renal calculi may be present. No hydronephrosis or ureteral calculus (ovarian vein phlebolith noted on the right). 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 Asymmetric bladder wall thickening to the left. Stomach/Bowel:  No obstruction. No inflammatory changes. Vascular/Lymphatic: Bulky adenopathy in the upper abdomen with confluent nodal mass extending from the gastrohepatic ligament to the hepatic hilum and measuring up to 10 cm. Retroperitoneal adenopathy continues inferiorly to the level of the aortic bifurcation. Atherosclerotic calcification Reproductive:No pathologic findings. Other: No ascites or pneumoperitoneum. Musculoskeletal: Heterogeneous density of marrow  without discrete destructive lesion. There is a L4 superior endplate with chronic appearance based on overlying vacuum phenomenon. Height loss is mild. Bone metastases may be present. IMPRESSION: 1. Extensive metastatic disease in the liver and retroperitoneal lymph nodes. Patient has history of breast cancer and there is a 4 cm left renal mass compatible with renal cell carcinoma. 2. Asymmetric left bladder wall thickening. 3. L4 superior endplate fracture, with nonacute appearance. This may be pathologic. Electronically Signed   By: Monte Fantasia M.D.   On: 10/01/2016 17:57    ASSESSMENT & PLAN:  # 61 year old female patient currently admitted to the hospital for generalized weakness/UTI. Imaging shows concerns for metastatic disease  # CT chest and pelvis- hilar/mediastinal adenopathy; concerning for superior vena cava compression. Also shows multiple liver lesions;  retroperitoneal lymph nodes; and also left-sided kidney mass approximately 4 cm in size. S/p US guided liver Biopsy on 2/06- will await pathology. Appreciate radiation oncology evaluation- in view of severe compression of superior vena cava.   # Multiple comorbidities including- history of stroke/TIA; atrial flutter on Eliquis; CHF; history of peripheral vascular disease.    Cammie Sickle, MD 10/06/2016 8:06 AM

## 2016-10-06 NOTE — Progress Notes (Signed)
PT Cancellation Note  Patient Details Name: Megan Cisneros MRN: [id number] DOB: 12-22-1955   Cancelled Treatment:    Reason Eval/Treat Not Completed: Other (comment). Pt currently with HR at 126bpm as well as BP at 66/41. Pt pending cardio consult at this time. Will hold at this time as pt not appropriate for PT.   Alondra Sahni 10/06/2016, 10:36 AM  Greggory Stallion, PT, DPT [phone]

## 2016-10-06 NOTE — Progress Notes (Signed)
Daily Progress Note   Patient Name: Megan Cisneros       Date: 10/06/2016 DOB: 06-23-1956  Age: 61 y.o. MRN#: [id number] Attending Physician: Vaughan Basta, MD Primary Care Physician: Donnie Coffin, MD Admit Date: 10/01/2016  Reason for Consultation/Follow-up: Establishing goals of care  Subjective: Patient awake, alert, and oriented. She will likely be moved to stepdown/ICU  due to hypotension and tachycardia/afib, HR 120's. Liver biopsy done yesterday.   Spoke with patient again regarding advanced directives/code status. She tells me her daughter "is taking care of paperwork today" and that she would want the patient intubated, if necessary, in order to have more time with her. I spoke of my concern of unlikelihood of her surviving resuscitation and this being more harm than good to her at the end of her life with cancer and multiple co-morbidities. Also, poor quality of life if able to survive resuscitation and placed on life support. Patient seems to understand but states "my daughter wouldn't allow me to not have a tube placed."  I called daughter, Andris Flurry. She will not be back at the hospital until this evening. I introduced palliative services and the support I will provide for her and her family during hospitalization. She is appreciative of the consult stating " I have never had to deal with a terminal illness and don't know what to do." We plan to meet tomorrow, 2/8 at [place] has my contact information.    Length of Stay: 5  Current Medications: Scheduled Meds:  . apixaban  5 mg Oral BID  . atorvastatin  80 mg Oral q1800  . cephALEXin  500 mg Oral Q12H  . digoxin  0.5 mg Intravenous Once  . docusate sodium  100 mg Oral BID  . feeding supplement (ENSURE ENLIVE)  237 mL  Oral BID BM  . gabapentin  100 mg Oral BID  . insulin aspart  0-9 Units Subcutaneous TID WC  . insulin detemir  5 Units Subcutaneous QHS  . metFORMIN  1,000 mg Oral BID WC  . multivitamin with minerals  1 tablet Oral Daily  . oxybutynin  5 mg Oral BID  . pantoprazole  40 mg Oral Daily  . predniSONE  1 mg Oral Q breakfast  . sodium chloride  500 mL Intravenous Once  . traZODone  100 mg Oral  BID    Continuous Infusions: . sodium chloride    . sodium chloride 100 mL/hr at 10/05/16 1944    PRN Meds: acetaminophen **OR** acetaminophen, albuterol, bisacodyl, HYDROcodone-acetaminophen, ondansetron **OR** ondansetron (ZOFRAN) IV, traZODone  Physical Exam  Constitutional: She is oriented to person, place, and time. She appears ill.  HENT:  Head: Atraumatic.  Cardiovascular: Tachycardia present.   Pulmonary/Chest: Effort normal. She has decreased breath sounds.  Abdominal: Normal appearance.  Neurological: She is alert and oriented to person, place, and time.  Skin: Skin is warm and dry. There is pallor.  Psychiatric: She has a normal mood and affect. Her speech is normal and behavior is normal.  Nursing note and vitals reviewed.          Vital Signs: BP (!) 66/41 (BP Location: Right Arm)   Pulse (!) 126   Temp 98.5 F (36.9 C) (Oral)   Resp 20   Ht 5' (1.524 m)   Wt 76.5 kg (168 lb 10.4 oz)   SpO2 94%   BMI 32.94 kg/m  SpO2: SpO2: 94 % O2 Device: O2 Device: Nasal Cannula O2 Flow Rate: O2 Flow Rate (L/min): 4 L/min  Intake/output summary:   Intake/Output Summary (Last 24 hours) at 10/06/16 1142 Last data filed at 10/06/16 0800  Gross per 24 hour  Intake          2001.67 ml  Output              300 ml  Net          1701.67 ml   LBM: Last BM Date: 10/04/16 Baseline Weight: Weight: 76.7 kg (169 lb) Most recent weight: Weight: 76.5 kg (168 lb 10.4 oz)       Palliative Assessment/Data: PPS 40%   Flowsheet Rows   Flowsheet Row Most Recent Value  Intake Tab    Referral Department  Hospitalist  Unit at Time of Referral  Oncology Unit  Palliative Care Primary Diagnosis  Cancer  Date Notified  10/04/16  Palliative Care Type  New Palliative care  Reason for referral  Clarify Goals of Care  Date of Admission  10/01/16  Date first seen by Palliative Care  10/05/16  # of days Palliative referral response time  1 Day(s)  # of days IP prior to Palliative referral  3  Clinical Assessment  Palliative Performance Scale Score  40%  Psychosocial & Spiritual Assessment  Palliative Care Outcomes  Patient/Family meeting held?  Yes  Who was at the meeting?  patient  Palliative Care Outcomes  Clarified goals of care, Provided psychosocial or spiritual support, ACP counseling assistance, Linked to palliative care logitudinal support      Patient Active Problem List   Diagnosis Date Noted  . Liver lesion   . Palliative care by specialist   . Goals of care, counseling/discussion   . DNR (do not resuscitate) discussion   . Pressure injury of skin 10/02/2016  . Metastatic lung cancer (metastasis from lung to other site) ([place]) 10/01/2016  . UTI (urinary tract infection) 08/06/2016  . Tinea corporis 08/06/2016  . Muscle weakness (generalized)   . Notalgia   . Hypokalemia 08/05/2016  . Hypomagnesemia 08/05/2016  . Cellulitis of left lower extremity   . Chest pain   . Essential hypertension 01/19/2016  . Acute on chronic diastolic CHF (congestive heart failure) ([place])   . Shortness of breath   . Bilateral leg edema   . SOB (shortness of breath)   . Morbid obesity due to excess  calories ([place])   . CHF (congestive heart failure) ([place]) 12/11/2015    Palliative Care Assessment & Plan   Patient Profile: 61 y.o. female  with past medical history of CVA, PVD, essential  hypertension, DM, diastolic CHF, afib/flutter, and breast cancer admitted on 10/01/2016 with weakness, fall, rectal bleeding, and weight loss. Complains of rectal bleeding for 1 month without  evidence of abdominal pain. Per daughter, patient has lost about 20 lbs in 2 months and with decreased appetite. CT chest/pelvis reveals hilar/mediastinal adenopathy concerning for superior vena cava compression, multiple liver lesions, retroperitoneal lymph nodes, and left-sided kidney mass approximately 4cm. Evaluated by Dr. Gay Filler for primary lung/small cell with metastases to liver. Plan for US liver biopsy today, 2/6. Radiation oncology consulted and plans to start palliative radiation therapy to chest. Palliative medicine consultation for goals of care. On 2/7, patient now in afib RVR and hypotensive (66/41). Cardiology consulted. Patient to be transferred to stepdown.   Assessment: Generalized weakness Metastatic cancer-possible right lung, liver, retroperitoneal nodes, and left renal mass UTI L4 fracture (? Pathologic) Afib Diastolic CHF  Recommendations/Plan:  FULL code.   Will meet with patient and daughter tomorrow, 2/8 at 8am to further discuss [place], advanced directives/code status, diagnoses, prognosis, and EOL wishes.   Goals of Care and Additional Recommendations:  Limitations on Scope of Treatment: Full Scope Treatment  Code Status: FULL   Code Status Orders        Start     Ordered   10/01/16 1945  Full code  Continuous     10/01/16 1947    Code Status History    Date Active Date Inactive Code Status Order ID Comments User Context   08/05/2016  1:47 PM 08/09/2016  8:09 PM Full Code [id number]  Gladstone Lighter, MD ED   12/12/2015 12:41 AM 12/13/2015  4:13 PM Full Code [id number]  Fritzi Mandes, MD ED       Prognosis:   Unable to determine guarded with new diagnosis of metastatic cancer-lung, liver, retroperitoneal nodes, and kidney mass.  Discharge Planning:  To Be Determined  Care plan was discussed with patient, daughter, RN, Dr. Anselm Jungling  Thank you for allowing the Palliative Medicine Team to assist in the care of this patient.   Time In:  1015 Time Out: 1050 Total Time 57mn Prolonged Time Billed  no       Greater than 50%  of this time was spent counseling and coordinating care related to the above assessment and plan.  MIhor Dow FNP-C Palliative Medicine Team  Phone: [phone]: [phone] Please contact Palliative Medicine Team phone at [phone] questions and concerns.

## 2016-10-06 NOTE — Consult Note (Signed)
Cardiology Consultation Note  Patient ID: Megan Cisneros, MRN: [id number], DOB/AGE: 63-03-1956 63 y.o. Admit date: 10/01/2016   Date of Consult: 10/06/2016 Primary Physician: Donnie Coffin, MD Primary Cardiologist: Dr. Rockey Situ, MD Requesting Physician: Dr. Anselm Jungling, MD  Chief Complaint: SOB Reason for Consult: Afib/flutter with RVR  HPI: 61 y.o. female with h/o atrial fibrillation status post recent cardioversion, HFpEF, hypertension, and remote stroke who presented to [facility] on 2/2 with generalized weakness and was found to have metastatic cancer.   Patient was admitted on 2/2 with generalized weakness and SOB. She was found to have a large chest mass impinging on the superior vena, with widespread evidence of metastatic disease including liver metastasis along with a 4 cm left kidney lesion concerning for renal cell carcinoma. Her Eliquis was held upon admission on 2/2 and she underwent liver biopsy on 2/6. Eliquis was restarted on the morning of 2/7. She went into Afib/flutter with RVR on 2/6 with heart rates in the 130's bpm. BP was soft into the 61'W systolic on 2/6. She was continued on metoprolol and diltiazem. BP dropped on 2/7 with continued tachycardic heart rates into the 130's bpm. Cardiology was asked to see. Labs showed potassium at goal. BP 43'X systolic this morning. Magnesium 1.4. AKI with SCr 1.64.   Past Medical History:  Diagnosis Date  . Atrial flutter ([place])    a. Dx 11/2015 -->CHA2DS2VASc = 7-->Eliquis '5mg'$  BID.  Marland Kitchen Breast cancer ([place])   . Chronic diastolic CHF (congestive heart failure) ([place])    a. 11/2015 Echo: EF 60-65%, no rwma, mild MR, mildly dil LA, nl RV fxn.  . Chronic Dyspnea on Exertion    a. 2004 Stress test prior to LE bypass->reportedly nl.  . Diabetes mellitus without complication ([place])    a. Dx as borderline diabetic in the 80's-->progressed over the years.  . Essential hypertension   . Lower extremity edema   . Morbid obesity ([place])   . PVC's (premature  ventricular contractions)   . PVD (peripheral vascular disease) ([place])    a. 2004 s/p RLE bypass - complicated by recurrent infection.  . Stroke [facility])    a. TIA x 2;  b. Stroke in 2007 in [place].      Most Recent Cardiac Studies: As above   Surgical History:  Past Surgical History:  Procedure Laterality Date  . Cataract Surgery Right    03/2014  . CESAREAN SECTION    . ELECTROPHYSIOLOGIC STUDY N/A 08/09/2016   Procedure: CARDIOVERSION;  Surgeon: Wellington Hampshire, MD;  Location: [facility] ORS;  Service: Cardiovascular;  Laterality: N/A;  . LEG SURGERY    . MASTECTOMY Left   . right leg bypass Right      Home Meds: Prior to Admission medications   Medication Sig Start Date End Date Taking? Authorizing Provider  apixaban (ELIQUIS) 5 MG TABS tablet Take 1 tablet (5 mg total) by mouth 2 (two) times daily. 01/28/16  Yes Minna Merritts, MD  atorvastatin (LIPITOR) 80 MG tablet Take 80 mg by mouth daily at 6 PM.  12/30/15  Yes Historical Provider, MD  diltiazem (CARDIZEM CD) 120 MG 24 hr capsule Take 1 capsule (120 mg total) by mouth daily. 08/10/16  Yes Vaughan Basta, MD  furosemide (LASIX) 40 MG tablet Take 1 tablet (40 mg total) by mouth 2 (two) times daily. Patient taking differently: Take 40 mg by mouth daily.  03/22/16  Yes Minna Merritts, MD  gabapentin (NEURONTIN) 100 MG capsule Take 1 capsule (100 mg  total) by mouth daily. Patient taking differently: Take 200 mg by mouth 2 (two) times daily.  08/09/16 08/09/17 Yes Vaughan Basta, MD  insulin detemir (LEVEMIR) 100 UNIT/ML injection Inject 0.05 mLs (5 Units total) into the skin at bedtime. Patient taking differently: Inject 10 Units into the skin daily.  08/09/16  Yes Vaughan Basta, MD  lisinopril (PRINIVIL,ZESTRIL) 10 MG tablet Take 1 tablet (10 mg total) by mouth daily. 08/10/16  Yes Vaughan Basta, MD  metFORMIN (GLUCOPHAGE) 500 MG tablet Take 1,000 mg by mouth 2 (two) times daily.    Yes Historical  Provider, MD  metoprolol tartrate (LOPRESSOR) 25 MG tablet Take 1 tablet (25 mg total) by mouth 2 (two) times daily. 08/11/16 11/09/16 Yes Christopher End, MD  Multiple Vitamin (MULTIVITAMIN WITH MINERALS) TABS tablet Take 1 tablet by mouth daily.   Yes Historical Provider, MD  omeprazole (PRILOSEC) 20 MG capsule Take 20 mg by mouth daily.   Yes Historical Provider, MD  oxybutynin (DITROPAN) 5 MG tablet Take 5 mg by mouth 2 (two) times daily.   Yes Historical Provider, MD  traZODone (DESYREL) 100 MG tablet Take 100 mg by mouth 2 (two) times daily. Takes with gabapentin.   Yes Historical Provider, MD  VENTOLIN HFA 108 (90 Base) MCG/ACT inhaler Inhale 2 puffs into the lungs every 6 (six) hours as needed.  12/13/15   Historical Provider, MD    Inpatient Medications:  . apixaban  5 mg Oral BID  . atorvastatin  80 mg Oral q1800  . cephALEXin  500 mg Oral Q12H  . docusate sodium  100 mg Oral BID  . feeding supplement (ENSURE ENLIVE)  237 mL Oral BID BM  . gabapentin  100 mg Oral BID  . insulin aspart  0-9 Units Subcutaneous TID WC  . insulin detemir  5 Units Subcutaneous QHS  . metFORMIN  1,000 mg Oral BID WC  . multivitamin with minerals  1 tablet Oral Daily  . oxybutynin  5 mg Oral BID  . pantoprazole  40 mg Oral Daily  . predniSONE  1 mg Oral Q breakfast  . traZODone  100 mg Oral BID   . sodium chloride    . sodium chloride 150 mL/hr at 10/06/16 0814    Allergies: No Known Allergies  Social History   Social History  . Marital status: Widowed    Spouse name: N/A  . Number of children: N/A  . Years of education: N/A   Occupational History  . Not on file.   Social History Main Topics  . Smoking status: Former Research scientist (life sciences)  . Smokeless tobacco: Never Used  . Alcohol use No  . Drug use: No  . Sexual activity: Not on file   Other Topics Concern  . Not on file   Social History Narrative   Lives in [place] by herself.  Originally from [place], [place], later moved to [place] and then [place].   Has been in [place] for 8 yrs.  Family nearby.  Does not routinely exercise.   Has a cane and a walker     Family History  Problem Relation Age of Onset  . Heart attack Father     died in his early 89's.  . Diabetes Father   . Cancer Mother     deceased  . Colon cancer Mother   . Cancer Brother     multiple cancers, deceased.  . Leukemia Brother   . Liver cancer Brother   . Pancreatic cancer Brother  Review of Systems: Review of Systems  Constitutional: Positive for malaise/fatigue and weight loss. Negative for chills, diaphoresis and fever.  HENT: Negative for congestion.   Eyes: Negative for discharge and redness.  Respiratory: Positive for shortness of breath. Negative for cough, hemoptysis, sputum production and wheezing.   Cardiovascular: Negative for chest pain, palpitations, orthopnea, claudication, leg swelling and PND.  Gastrointestinal: Negative for abdominal pain, blood in stool, heartburn, melena, nausea and vomiting.  Genitourinary: Negative for hematuria.  Musculoskeletal: Negative for falls and myalgias.  Skin: Negative for rash.  Neurological: Positive for weakness. Negative for dizziness, tingling, tremors, sensory change, speech change, focal weakness and loss of consciousness.  Endo/Heme/Allergies: Does not bruise/bleed easily.  Psychiatric/Behavioral: Negative for substance abuse. The patient is not nervous/anxious.   All other systems reviewed and are negative.   Labs: No results for input(s): CKTOTAL, CKMB, TROPONINI in the last 72 hours. Lab Results  Component Value Date   WBC 9.4 10/06/2016   HGB 10.4 (L) 10/06/2016   HCT 30.7 (L) 10/06/2016   MCV 86.3 10/06/2016   PLT 226 10/06/2016     Recent Labs Lab 10/06/16 1126  NA 138  K 4.1  CL 100*  CO2 27  BUN 43*  CREATININE 1.64*  CALCIUM 7.6*  GLUCOSE 165*   Lab Results  Component Value Date   CHOL 171 04/04/2014   HDL 31 (L) 04/04/2014   LDLCALC 70 04/04/2014   TRIG 351 (H)  04/04/2014   No results found for: DDIMER  Radiology/Studies:  Dg Chest 1 View  Result Date: 10/01/2016 CLINICAL DATA:  Acute onset of generalized weakness. Initial encounter. EXAM: CHEST 1 VIEW COMPARISON:  Chest radiographs performed 08/05/2016 and 12/11/2015 FINDINGS: Enlarging dense right hilar opacity is suspicious for malignancy. The left lung appears relatively clear. No pleural effusion or pneumothorax is seen. There is elevation of the right hemidiaphragm. The cardiomediastinal silhouette is normal in size. No acute osseous abnormalities are identified. IMPRESSION: Enlarging dense right hilar opacity is suspicious for malignancy. CT of the chest with contrast is recommended for further evaluation. Electronically Signed   By: Garald Balding M.D.   On: 10/01/2016 17:35   Dg Chest 2 View  Result Date: 10/06/2016 CLINICAL DATA:  Hypoxia.  History of breast carcinoma EXAM: CHEST  2 VIEW COMPARISON:  Chest radiograph October 01, 2016 and chest CT October 03, 2016 FINDINGS: There is opacification of most of the right with combination of consolidation, extensive adenopathy, and pleural effusion. There has been progression of consolidation effusion on the right compared to recent studies. Left lung is clear. Heart size is normal. The left hilum and mediastinal region appears unremarkable. There is atherosclerotic calcification in the aorta. No obvious bony lesions are evident by radiography. The areas of concern for neoplastic focus in the sternum not appreciable by radiography. IMPRESSION: Extensive opacification throughout most of the right lung with progression from recent studies. The appearance is consistent with a combination of pleural effusion, extensive adenopathy comment consolidation. CT shows widespread neoplastic change throughout the right lung. Left lung appears clear. Stable cardiac silhouette. Aortic atherosclerosis. Electronically Signed   By: Lowella Grip III M.D.   On: 10/06/2016  10:22   Dg Lumbar Spine 2-3 Views  Result Date: 10/02/2016 CLINICAL DATA:  Back pain and weakness for 2 days. EXAM: LUMBAR SPINE - 2-3 VIEW COMPARISON:  CT scan October 01, 2016 FINDINGS: Scalloping of the L4 superior endplate was better assessed on yesterday's CT scan and is unchanged. Degenerative changes in the  lower thoracic spine. No other interval changes or bony abnormalities. IMPRESSION: No change since yesterday's CT scan. Continued scalloping of the L4 superior endplate. Electronically Signed   By: Dorise Bullion III M.D   On: 10/02/2016 17:33   Ct Head Wo Contrast  Result Date: 10/01/2016 CLINICAL DATA:  Weakness for 2 weeks EXAM: CT HEAD WITHOUT CONTRAST TECHNIQUE: Contiguous axial images were obtained from the base of the skull through the vertex without intravenous contrast. COMPARISON:  None. FINDINGS: Brain: No evidence of acute infarction, hemorrhage, extra-axial collection, ventriculomegaly, or mass effect. Generalized cerebral atrophy. Periventricular white matter low attenuation likely secondary to microangiopathy. Vascular: Cerebrovascular atherosclerotic calcifications are noted. Skull: Negative for fracture or focal lesion. Sinuses/Orbits: Visualized portions of the orbits are unremarkable. Visualized portions of the paranasal sinuses and mastoid air cells are unremarkable. Other: None. IMPRESSION: 1. No acute intracranial pathology. 2. Chronic microvascular disease and cerebral atrophy. Electronically Signed   By: Kathreen Devoid   On: 10/01/2016 17:24   Ct Chest W Contrast  Result Date: 10/03/2016 CLINICAL DATA:  Metastatic breast cancer EXAM: CT CHEST, ABDOMEN, AND PELVIS WITH CONTRAST TECHNIQUE: Multidetector CT imaging of the chest, abdomen and pelvis was performed following the standard protocol during bolus administration of intravenous contrast. CONTRAST:  110m ISOVUE-300 IOPAMIDOL (ISOVUE-300) INJECTION 61% COMPARISON:  CT scan 10/01/2016 FINDINGS: CT CHEST FINDINGS Chest  wall: Status post left mastectomy. No right-sided breast masses are identified. No axillary lymphadenopathy. 8 mm supraclavicular lymph node noted on image 2. There also enlarge subclavicular lymph nodes on the right side. The thyroid gland is grossly normal. Cardiovascular: The heart is within normal limits in size and stable. All stable dense atherosclerotic calcifications involving the aorta and branch vessels. No aneurysm or dissection. Three-vessel coronary artery calcifications are noted. Mediastinum/Nodes: There is extensive mediastinal and right hilar tumor. Large nodal masses throughout the mediastinum. The right paratracheal mass measures 4.8 x 3.9 cm on image 17. There is severe compression of the SVC but it is not completely occluded. Right hilar/ mediastinal mass on image number 25 measures 7.8 x 6.7 cm. Subcarinal nodal mass on image number 28 measures 34 x 30 mm. Lungs/Pleura: Lobulated right upper lobe lung mass on image number 65 measures 38 x 32 mm. Marked interstitial thickening on the right could suggest interstitial spread of tumor. The left lung is relatively clear. No acute pulmonary findings or worrisome pulmonary lesions. There are small bilateral pleural effusions. Musculoskeletal: Mixed lytic and sclerotic sternal lesions are suspicious for metastatic disease. No other obvious bone lesions. CT ABDOMEN PELVIS FINDINGS Hepatobiliary: Index lesion in the right lobe on image number 53 measures 8.6 x 4.7 cm. Index lesion in the lateral segment of the left hepatic lobe on image number 52 measures 5.4 x 4.1 cm. The gallbladder is grossly normal. A stable calcified gallstone is noted. Small amount of pericholecystic fluid. Pancreas: No mass, inflammation or ductal dilatation. Spleen: Normal size.  No focal lesions. Adrenals/Urinary Tract: Left adrenal gland lesions suspicious for metastasis. The right adrenal gland is normal. Complex enhancing left upper pole renal lesion measures 5.9 x 4.2 cm and  is consistent with renal cell carcinoma. No renal vein invasion. Stomach/Bowel: The stomach, duodenum, small bowel and colon are grossly normal. No inflammatory changes, mass lesions or obstructive findings. Vascular/Lymphatic: Bulky knee upper abdominal lymphadenopathy with a confluent mass in the portal region, gastrohepatic ligament and celiac axis. This measures 10.2 x 6.5 cm on image number 55. There is compression of the extrahepatic portal vein.  There is also bulky retroperitoneal lymphadenopathy bilaterally. Advanced atherosclerotic calcifications involving the aorta and branch vessels. No aneurysm or dissection. Reproductive: The uterus and ovaries are unremarkable. Other: No pelvic mass or adenopathy. No free pelvic fluid collections. Moderate distention of the bladder. No inguinal mass or adenopathy. Musculoskeletal: No definite lytic or sclerotic bone lesions to suggest metastasis. IMPRESSION: 1. Bulky mediastinal and right hilar lymphadenopathy. Imminent SVC occlusion. 2. Large right upper lobe lung mass and surrounding interstitial thickening suggesting interstitial spread of tumor. 3. Diffuse hepatic metastatic disease and bulky upper abdominal and retroperitoneal lymphadenopathy. 4. Complex enhancing partially calcified left upper pole renal mass most likely renal cell carcinoma. 5. Suspect sternal metastatic disease. I do not see any other obvious bone lesions. 6. Advanced atherosclerotic calcifications involving the thoracic and abdominal aorta and branch vessels. Electronically Signed   By: Marijo Sanes M.D.   On: 10/03/2016 16:26   Mr Lumbar Spine Wo Contrast  Result Date: 10/04/2016 CLINICAL DATA:  Acute on chronic low back pain with a recent fall. Recently diagnosed metastatic disease. L4 compression fracture. EXAM: MRI LUMBAR SPINE WITHOUT CONTRAST TECHNIQUE: Multiplanar, multisequence MR imaging of the lumbar spine was performed. No intravenous contrast was administered. COMPARISON:  CT  abdomen and pelvis 10/03/2016 FINDINGS: Segmentation:  Standard. Alignment:  Slight anterolisthesis of L4 on L5 due to facet disease. Vertebrae: L4 superior endplate fracture with 33% height loss centrally. No substantial marrow edema is identified directly subjacent to the compressed portion of the endplate. There is some hyperintense STIR signal more posteriorly along the superior endplate and in the right aspect of the vertebral body, however this is similar in appearance to multiple other levels and is felt to reflect an underlying osseous lesion rather than edema from an acute fracture. Bone marrow signal is diffusely abnormal throughout the visualized lower thoracic and lumbar spine as well as sacrum with replacement of the majority of normal fatty marrow signal and numerous STIR hyperintense lesions throughout. No gross paraspinal or epidural tumor is identified on this unenhanced study. Conus medullaris: Extends to the L1 level and appears normal. Paraspinal and other soft tissues: Liver metastases and retroperitoneal lymphadenopathy as described on recent CT. Disc levels: T12-L1:  Minimal disc bulging without stenosis. L1-2:  Negative. L2-3:  Negative. L3-4: Disc bulging, shallow central disc protrusion, and ligamentum flavum thickening result in mild right greater than left lateral recess stenosis, mild to moderate spinal stenosis, and no significant neural foraminal stenosis. L4-5: Disc bulging, shallow left paracentral disc protrusion, ligamentum flavum thickening, and facet arthrosis result in moderate spinal stenosis, mild right and moderate left lateral recess stenosis, and mild left neural foraminal stenosis. There are small facet joint effusions, right larger than left. L5-S1: Right paracentral disc protrusion results in minimal right lateral recess narrowing and slight posterior deflection of the adjacent right S1 nerve root. No spinal or neural foraminal stenosis. Mild facet arthrosis. IMPRESSION:  1. Diffuse osseous metastatic disease. 2. L4 superior endplate fracture with mild height loss favored to be chronic. 3. Moderate spinal stenosis and left greater than right lateral recess stenosis at L4-5. 4. Mild-to-moderate spinal stenosis at L3-4. Electronically Signed   By: Logan Bores M.D.   On: 10/04/2016 12:04   Ct Abdomen Pelvis W Contrast  Result Date: 10/03/2016 CLINICAL DATA:  Metastatic breast cancer EXAM: CT CHEST, ABDOMEN, AND PELVIS WITH CONTRAST TECHNIQUE: Multidetector CT imaging of the chest, abdomen and pelvis was performed following the standard protocol during bolus administration of intravenous contrast. CONTRAST:  172m  ISOVUE-300 IOPAMIDOL (ISOVUE-300) INJECTION 61% COMPARISON:  CT scan 10/01/2016 FINDINGS: CT CHEST FINDINGS Chest wall: Status post left mastectomy. No right-sided breast masses are identified. No axillary lymphadenopathy. 8 mm supraclavicular lymph node noted on image 2. There also enlarge subclavicular lymph nodes on the right side. The thyroid gland is grossly normal. Cardiovascular: The heart is within normal limits in size and stable. All stable dense atherosclerotic calcifications involving the aorta and branch vessels. No aneurysm or dissection. Three-vessel coronary artery calcifications are noted. Mediastinum/Nodes: There is extensive mediastinal and right hilar tumor. Large nodal masses throughout the mediastinum. The right paratracheal mass measures 4.8 x 3.9 cm on image 17. There is severe compression of the SVC but it is not completely occluded. Right hilar/ mediastinal mass on image number 25 measures 7.8 x 6.7 cm. Subcarinal nodal mass on image number 28 measures 34 x 30 mm. Lungs/Pleura: Lobulated right upper lobe lung mass on image number 65 measures 38 x 32 mm. Marked interstitial thickening on the right could suggest interstitial spread of tumor. The left lung is relatively clear. No acute pulmonary findings or worrisome pulmonary lesions. There are small  bilateral pleural effusions. Musculoskeletal: Mixed lytic and sclerotic sternal lesions are suspicious for metastatic disease. No other obvious bone lesions. CT ABDOMEN PELVIS FINDINGS Hepatobiliary: Index lesion in the right lobe on image number 53 measures 8.6 x 4.7 cm. Index lesion in the lateral segment of the left hepatic lobe on image number 52 measures 5.4 x 4.1 cm. The gallbladder is grossly normal. A stable calcified gallstone is noted. Small amount of pericholecystic fluid. Pancreas: No mass, inflammation or ductal dilatation. Spleen: Normal size.  No focal lesions. Adrenals/Urinary Tract: Left adrenal gland lesions suspicious for metastasis. The right adrenal gland is normal. Complex enhancing left upper pole renal lesion measures 5.9 x 4.2 cm and is consistent with renal cell carcinoma. No renal vein invasion. Stomach/Bowel: The stomach, duodenum, small bowel and colon are grossly normal. No inflammatory changes, mass lesions or obstructive findings. Vascular/Lymphatic: Bulky knee upper abdominal lymphadenopathy with a confluent mass in the portal region, gastrohepatic ligament and celiac axis. This measures 10.2 x 6.5 cm on image number 55. There is compression of the extrahepatic portal vein. There is also bulky retroperitoneal lymphadenopathy bilaterally. Advanced atherosclerotic calcifications involving the aorta and branch vessels. No aneurysm or dissection. Reproductive: The uterus and ovaries are unremarkable. Other: No pelvic mass or adenopathy. No free pelvic fluid collections. Moderate distention of the bladder. No inguinal mass or adenopathy. Musculoskeletal: No definite lytic or sclerotic bone lesions to suggest metastasis. IMPRESSION: 1. Bulky mediastinal and right hilar lymphadenopathy. Imminent SVC occlusion. 2. Large right upper lobe lung mass and surrounding interstitial thickening suggesting interstitial spread of tumor. 3. Diffuse hepatic metastatic disease and bulky upper abdominal  and retroperitoneal lymphadenopathy. 4. Complex enhancing partially calcified left upper pole renal mass most likely renal cell carcinoma. 5. Suspect sternal metastatic disease. I do not see any other obvious bone lesions. 6. Advanced atherosclerotic calcifications involving the thoracic and abdominal aorta and branch vessels. Electronically Signed   By: Marijo Sanes M.D.   On: 10/03/2016 16:26   US Biopsy  Result Date: 10/05/2016 INDICATION: 61 year old female with evidence of metastatic disease in the chest, abdomen and bones. Tissue diagnosis is needed. EXAM: ULTRASOUND-GUIDED LIVER LESION BIOPSY MEDICATIONS: None. ANESTHESIA/SEDATION: No sedation was used. The patient's vital signs were monitored continuously by radiology nursing throughout the procedure under my direct supervision. FLUOROSCOPY TIME:  None COMPLICATIONS: None immediate. PROCEDURE: Informed  written consent was obtained from the patient after a thorough discussion of the procedural risks, benefits and alternatives. All questions were addressed. A timeout was performed prior to the initiation of the procedure. Liver was evaluated with ultrasound. There are subtle lesions scattered throughout the liver. A lesion in the right hepatic lobe was selected for biopsy. Abdomen was prepped and draped in sterile fashion. Skin and soft tissues were anesthetized with 1% lidocaine. Using ultrasound guidance, 17 gauge needle was directed into the right hepatic lobe lesion. A total of 4 core biopsies were obtained with an 18 gauge core device. Three of the core samples were adequate and placed in formalin. 17 gauge needle was removed without complication. Bandage placed over the puncture site. FINDINGS: Subtle lesions throughout the liver. Needle position confirmed within a right hepatic lesion. No significant bleeding or hematoma formation following the core biopsies. IMPRESSION: Successful ultrasound-guided core biopsy of a right hepatic lesion.  Electronically Signed   By: Markus Daft M.D.   On: 10/05/2016 17:27   Ct Renal Stone Study  Result Date: 10/01/2016 CLINICAL DATA:  Flank pain and hematuria. EXAM: CT ABDOMEN AND PELVIS WITHOUT CONTRAST TECHNIQUE: Multidetector CT imaging of the abdomen and pelvis was performed following the standard protocol without IV contrast. COMPARISON:  None. FINDINGS: Lower chest:  No contributory findings. Hepatobiliary: The liver is nearly replaced by innumerable masses.Cholelithiasis. No evidence of acute cholecystitis. Pancreas: The head and neck is indistinguishable from bulky retroperitoneal adenopathy. Spleen: Unremarkable. Adrenals/Urinary Tract: Negative adrenals. 4 cm mass exophytic from the upper pole left kidney. Small renal calculi may be present. No hydronephrosis or ureteral calculus (ovarian vein phlebolith noted on the right). 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 Asymmetric bladder wall thickening to the left. Stomach/Bowel:  No obstruction. No inflammatory changes. Vascular/Lymphatic: Bulky adenopathy in the upper abdomen with confluent nodal mass extending from the gastrohepatic ligament to the hepatic hilum and measuring up to 10 cm. Retroperitoneal adenopathy continues inferiorly to the level of the aortic bifurcation. Atherosclerotic calcification Reproductive:No pathologic findings. Other: No ascites or pneumoperitoneum. Musculoskeletal: Heterogeneous density of marrow without discrete destructive lesion. There is a L4 superior endplate with chronic appearance based on overlying vacuum phenomenon. Height loss is mild. Bone metastases may be present. IMPRESSION: 1. Extensive metastatic disease in the liver and retroperitoneal lymph nodes. Patient has history of breast cancer and there is a 4 cm left renal mass compatible with renal cell carcinoma. 2. Asymmetric left bladder wall thickening. 3. L4 superior endplate fracture, with nonacute appearance. This may be pathologic. Electronically Signed   By:  Monte Fantasia M.D.   On: 10/01/2016 17:57    EKG: Interpreted by me showed: NSR, 73 bpm, poor R wave progression, nonspecific st/t changes Telemetry: Interpreted by me showed: Afib/flutter with RVR, 130's bpm fairly regular  Weights: Filed Weights   10/04/16 0500 10/05/16 0500 10/06/16 0500  Weight: 169 lb 1.6 oz (76.7 kg) 165 lb 9.6 oz (75.1 kg) 168 lb 10.4 oz (76.5 kg)     Physical Exam: Blood pressure (!) 120/58, pulse (!) 116, temperature 98.6 F (37 C), temperature source Oral, resp. rate 20, height 5' (1.524 m), weight 168 lb 10.4 oz (76.5 kg), SpO2 92 %. Body mass index is 32.94 kg/m. General: Well developed, well nourished, in no acute distress. Head: Normocephalic, atraumatic, sclera non-icteric, no xanthomas, nares are without discharge.  Neck: Negative for carotid bruits. JVD not elevated. Lungs: Clear bilaterally to auscultation without wheezes, rales, or rhonchi. Breathing is unlabored. Heart: Tachycardic, irregular  with S1 S2. No murmurs, rubs, or gallops appreciated. Abdomen: Soft, non-tender, non-distended with normoactive bowel sounds. No hepatomegaly. No rebound/guarding. No obvious abdominal masses. Msk:  Strength and tone appear normal for age. Extremities: No clubbing or cyanosis. No edema. Distal pedal pulses are 2+ and equal bilaterally. Neuro: Alert and oriented X 3. No facial asymmetry. No focal deficit. Moves all extremities spontaneously. Psych:  Responds to questions appropriately with a normal affect.    Assessment and Plan:  Principal Problem:   Metastatic lung cancer (metastasis from lung to other site) [facility]) Active Problems:   Atrial fibrillation with RVR (HCC)   Hypotension   Hypomagnesemia   Pressure injury of skin   Liver lesion   Palliative care by specialist   Goals of care, counseling/discussion   DNR (do not resuscitate) discussion   Metastatic cancer ([place])    1. Afib/flutter with RVR/hypotension: -Likely in the setting of her  metastatic carcinoma -Appears she went into afib/flutter with rvr the previous day and has remained tachycardic since -BP soft into the 12'W systolic this morning -Gave a 500 mL normal saline bolus -Has been on beta blocker and calcium channel blockers per IM with continued tachycardic rates and hypotension -Will stop beta blocker and calcium channel blocker in an effort to improve BP -Give a one-time loading dose of IV digoxin for rate control -Recommend transfer to ICU (this was discussed with IM and they are to place this order as usual)   -May need IV amiodarone for rate control, if BP is adequate could bolus her, otherwise would need without bolus vs slow bolus over 1 hour -back on Eliquis now that she is s/p liver biopsy -If her tachycardia persists with hypotension she may require TEE/DCCV (Eliquis was interrupted for liver biopsy) -CHADS2VASc at least 5 (CHF, HTN, stroke x 2, female) -Check 12-lead EKG  2. Chronic diastolic CHF: -She does not appear volume overloaded, actually appears somewhat dry -Lasix on hold given hypotension  3. Metastatic carcinoma: -Oncology on board -Status post liver biopsy -Per primary service   -Palliative care on board  4. Hypomagnesemia: -Replete to 2.0  5. AKI: -Likely ATN 2/2 hypotension -IV fluids as above  6. Remaining per IM    Signed, Christell Faith, PA-C [facility] Pager: [phone] 10/06/2016, 2:47 PM

## 2016-10-06 NOTE — Clinical Social Work Placement (Signed)
   CLINICAL SOCIAL WORK PLACEMENT  NOTE  Date:  10/06/2016  Patient Details  Name: Megan Cisneros MRN: [id number] Date of Birth: [birth year]  Clinical Social Work is seeking post-discharge placement for this patient at the [place] level of care (*CSW will initial, date and re-position this form in  chart as items are completed):  Yes   Patient/family provided with [place] Work Department's list of facilities offering this level of care within the geographic area requested by the patient (or if unable, by the patient's family).  Yes   Patient/family informed of their freedom to choose among providers that offer the needed level of care, that participate in Medicare, Medicaid or managed care program needed by the patient, have an available bed and are willing to accept the patient.  Yes   Patient/family informed of [facility]'s ownership interest in [facility] and [facility], as well as of the fact that they are under no obligation to receive care at these facilities.  PASRR submitted to EDS on 10/06/16     PASRR number received on 10/06/16     Existing PASRR number confirmed on       FL2 transmitted to all facilities in geographic area requested by pt/family on 10/06/16     FL2 transmitted to all facilities within larger geographic area on       Patient informed that his/her managed care company has contracts with or will negotiate with certain facilities, including the following:            Patient/family informed of bed offers received.  Patient chooses bed at       Physician recommends and patient chooses bed at      Patient to be transferred to   on  .  Patient to be transferred to facility by       Patient family notified on   of transfer.  Name of family member notified:        PHYSICIAN Please sign FL2     Additional Comment:    _______________________________________________ Ross Ludwig, LCSWA 10/06/2016, 1:14  PM

## 2016-10-06 NOTE — Progress Notes (Signed)
ANTICOAGULATION CONSULT NOTE - Initial Consult  Pharmacy Consult for Apixaban Indication: atrial fibrillation  No Known Allergies  Patient Measurements: Height: 5' (152.4 cm) Weight: 168 lb 10.4 oz (76.5 kg) IBW/kg (Calculated) : 45.5 Heparin Dosing Weight:   Vital Signs: Temp: 97.4 F (36.3 C) (02/07 0447) Temp Source: Oral (02/07 0447) BP: 113/49 (02/07 0629) Pulse Rate: 122 (02/07 0629)  Labs:  Recent Labs  10/04/16 0858 10/05/16 0412  APTT 31  --   LABPROT 13.6  --   INR 1.04  --   CREATININE  --  1.11*    Estimated Creatinine Clearance: 48 mL/min (by C-G formula based on SCr of 1.11 mg/dL (H)).   Medical History: Past Medical History:  Diagnosis Date  . Atrial flutter ([place])    a. Dx 11/2015 -->CHA2DS2VASc = 7-->Eliquis '5mg'$  BID.  Marland Kitchen Breast cancer ([place])   . Chronic diastolic CHF (congestive heart failure) ([place])    a. 11/2015 Echo: EF 60-65%, no rwma, mild MR, mildly dil LA, nl RV fxn.  . Chronic Dyspnea on Exertion    a. 2004 Stress test prior to LE bypass->reportedly nl.  . Diabetes mellitus without complication ([place])    a. Dx as borderline diabetic in the 80's-->progressed over the years.  . Essential hypertension   . Lower extremity edema   . Morbid obesity ([place])   . PVC's (premature ventricular contractions)   . PVD (peripheral vascular disease) ([place])    a. 2004 s/p RLE bypass - complicated by recurrent infection.  . Stroke [facility])    a. TIA x 2;  b. Stroke in 2007 in [place].    Medications:  Prescriptions Prior to Admission  Medication Sig Dispense Refill Last Dose  . apixaban (ELIQUIS) 5 MG TABS tablet Take 1 tablet (5 mg total) by mouth 2 (two) times daily. 60 tablet 3 10/01/2016 at 0800  . atorvastatin (LIPITOR) 80 MG tablet Take 80 mg by mouth daily at 6 PM.    10/01/2016 at 0800  . diltiazem (CARDIZEM CD) 120 MG 24 hr capsule Take 1 capsule (120 mg total) by mouth daily. 30 capsule 0 10/01/2016 at 0800  . furosemide (LASIX) 40 MG tablet Take 1 tablet  (40 mg total) by mouth 2 (two) times daily. (Patient taking differently: Take 40 mg by mouth daily. ) 180 tablet 3 10/01/2016 at 0800  . gabapentin (NEURONTIN) 100 MG capsule Take 1 capsule (100 mg total) by mouth daily. (Patient taking differently: Take 200 mg by mouth 2 (two) times daily. ) 30 capsule 0 10/01/2016 at 0800  . insulin detemir (LEVEMIR) 100 UNIT/ML injection Inject 0.05 mLs (5 Units total) into the skin at bedtime. (Patient taking differently: Inject 10 Units into the skin daily. ) 10 mL 11 10/01/2016 at 0800  . lisinopril (PRINIVIL,ZESTRIL) 10 MG tablet Take 1 tablet (10 mg total) by mouth daily. 30 tablet 0 10/01/2016 at 0800  . metFORMIN (GLUCOPHAGE) 500 MG tablet Take 1,000 mg by mouth 2 (two) times daily.    10/01/2016 at 0800  . metoprolol tartrate (LOPRESSOR) 25 MG tablet Take 1 tablet (25 mg total) by mouth 2 (two) times daily. 180 tablet 2 10/01/2016 at 0800  . Multiple Vitamin (MULTIVITAMIN WITH MINERALS) TABS tablet Take 1 tablet by mouth daily.   10/01/2016 at 0800  . omeprazole (PRILOSEC) 20 MG capsule Take 20 mg by mouth daily.   10/01/2016 at 0800  . oxybutynin (DITROPAN) 5 MG tablet Take 5 mg by mouth 2 (two) times daily.   10/01/2016 at  0800  . traZODone (DESYREL) 100 MG tablet Take 100 mg by mouth 2 (two) times daily. Takes with gabapentin.   10/01/2016 at 0800  . VENTOLIN HFA 108 (90 Base) MCG/ACT inhaler Inhale 2 puffs into the lungs every 6 (six) hours as needed.    Taking   Scheduled:  . apixaban  5 mg Oral BID  . atorvastatin  80 mg Oral q1800  . cephALEXin  500 mg Oral Q12H  . diltiazem  300 mg Oral Daily  . docusate sodium  100 mg Oral BID  . feeding supplement (ENSURE ENLIVE)  237 mL Oral BID BM  . furosemide  40 mg Oral Daily  . gabapentin  100 mg Oral BID  . insulin aspart  0-9 Units Subcutaneous TID WC  . insulin detemir  5 Units Subcutaneous QHS  . metFORMIN  1,000 mg Oral BID WC  . metoprolol tartrate  25 mg Oral BID  . metoprolol tartrate  25 mg Oral Once  .  multivitamin with minerals  1 tablet Oral Daily  . oxybutynin  5 mg Oral BID  . pantoprazole  40 mg Oral Daily  . predniSONE  1 mg Oral Q breakfast  . traZODone  100 mg Oral BID    Assessment: Pharmacy consulted to dose and monitor apixaban therapy in this 60 year old female for atrial fibrillation Goal of Therapy:   Monitor platelets by anticoagulation protocol: Yes   Plan:  Will give apixaban 5 mg PO BID.   Megan Cisneros 10/06/2016,8:19 AM

## 2016-10-06 NOTE — Progress Notes (Addendum)
[facility] [place] NAME: Megan Cisneros    MR#:  [id number]  DATE OF BIRTH:  08-26-1956  SUBJECTIVE:  CHIEF COMPLAINT:   Chief Complaint  Patient presents with  . Weakness     Weight loss and progressive weakness for last 1-2 months.  Found to have UTI.  On CT abd- have new findings of renal mass, metastasis to liver and retroperitoneal nodes.   Feels weak, but no other complains, tolerating diet, but not eating much.   Had MRI of spine , also showed mets to vertebra.   Awake today, have Tachycardia and A fib, BP on lower side.  REVIEW OF SYSTEMS:  CONSTITUTIONAL: No fever,positive for fatigue or weakness.  EYES: No blurred or double vision.  EARS, NOSE, AND THROAT: No tinnitus or ear pain.  RESPIRATORY: No cough, shortness of breath, wheezing or hemoptysis.  CARDIOVASCULAR: No chest pain, orthopnea, edema.  GASTROINTESTINAL: No nausea, vomiting, diarrhea or abdominal pain.  GENITOURINARY: No dysuria, hematuria.  ENDOCRINE: No polyuria, nocturia,  HEMATOLOGY: No anemia, easy bruising or bleeding SKIN: No rash or lesion. MUSCULOSKELETAL: No joint pain or arthritis.   NEUROLOGIC: No tingling, numbness, weakness.  PSYCHIATRY: No anxiety or depression.   ROS  DRUG ALLERGIES:  No Known Allergies  VITALS:  Blood pressure (!) 99/49, pulse (!) 111, temperature 98.6 F (37 C), temperature source Oral, resp. rate 20, height 5' (1.524 m), weight 76.5 kg (168 lb 10.4 oz), SpO2 95 %.  PHYSICAL EXAMINATION:  GENERAL:  61 y.o.-year-old patient lying in the bed with no acute distress.  EYES: Pupils equal, round, reactive to light and accommodation. No scleral icterus. Extraocular muscles intact.  HEENT: Head atraumatic, normocephalic. Oropharynx and nasopharynx clear. Oral mucosa dry. NECK:  Supple, no jugular venous distention. No thyroid enlargement, no tenderness.  LUNGS: Normal breath sounds bilaterally, no wheezing, rales,rhonchi or  crepitation. No use of accessory muscles of respiration.  CARDIOVASCULAR: S1, S2 fast and regular. No murmurs, rubs, or gallops.  ABDOMEN: Soft, nontender, nondistended. Bowel sounds present. No organomegaly or mass.  EXTREMITIES: No pedal edema, cyanosis, or clubbing.  NEUROLOGIC: Cranial nerves II through XII are intact. Muscle strength 4/5 in all extremities. Sensation intact. Gait not checked.  PSYCHIATRIC: The patient is alert and oriented x 3.  SKIN: No obvious rash, lesion, or ulcer.   Physical Exam LABORATORY PANEL:   CBC  Recent Labs Lab 10/06/16 1126  WBC 9.4  HGB 10.4*  HCT 30.7*  PLT 226   ------------------------------------------------------------------------------------------------------------------  Chemistries   Recent Labs Lab 10/06/16 1126  NA 138  K 4.1  CL 100*  CO2 27  GLUCOSE 165*  BUN 43*  CREATININE 1.64*  CALCIUM 7.6*  MG 1.4*   ------------------------------------------------------------------------------------------------------------------  Cardiac Enzymes  Recent Labs Lab 10/01/16 1408  TROPONINI 0.04*   ------------------------------------------------------------------------------------------------------------------  RADIOLOGY:  Dg Chest 2 View  Result Date: 10/06/2016 CLINICAL DATA:  Hypoxia.  History of breast carcinoma EXAM: CHEST  2 VIEW COMPARISON:  Chest radiograph October 01, 2016 and chest CT October 03, 2016 FINDINGS: There is opacification of most of the right with combination of consolidation, extensive adenopathy, and pleural effusion. There has been progression of consolidation effusion on the right compared to recent studies. Left lung is clear. Heart size is normal. The left hilum and mediastinal region appears unremarkable. There is atherosclerotic calcification in the aorta. No obvious bony lesions are evident by radiography. The areas of concern for neoplastic focus in the sternum not appreciable  by radiography.  IMPRESSION: Extensive opacification throughout most of the right lung with progression from recent studies. The appearance is consistent with a combination of pleural effusion, extensive adenopathy comment consolidation. CT shows widespread neoplastic change throughout the right lung. Left lung appears clear. Stable cardiac silhouette. Aortic atherosclerosis. Electronically Signed   By: Lowella Grip III M.D.   On: 10/06/2016 10:22   US Biopsy  Result Date: 10/05/2016 INDICATION: 61 year old female with evidence of metastatic disease in the chest, abdomen and bones. Tissue diagnosis is needed. EXAM: ULTRASOUND-GUIDED LIVER LESION BIOPSY MEDICATIONS: None. ANESTHESIA/SEDATION: No sedation was used. The patient's vital signs were monitored continuously by radiology nursing throughout the procedure under my direct supervision. FLUOROSCOPY TIME:  None COMPLICATIONS: None immediate. PROCEDURE: Informed written consent was obtained from the patient after a thorough discussion of the procedural risks, benefits and alternatives. All questions were addressed. A timeout was performed prior to the initiation of the procedure. Liver was evaluated with ultrasound. There are subtle lesions scattered throughout the liver. A lesion in the right hepatic lobe was selected for biopsy. Abdomen was prepped and draped in sterile fashion. Skin and soft tissues were anesthetized with 1% lidocaine. Using ultrasound guidance, 17 gauge needle was directed into the right hepatic lobe lesion. A total of 4 core biopsies were obtained with an 18 gauge core device. Three of the core samples were adequate and placed in formalin. 17 gauge needle was removed without complication. Bandage placed over the puncture site. FINDINGS: Subtle lesions throughout the liver. Needle position confirmed within a right hepatic lesion. No significant bleeding or hematoma formation following the core biopsies. IMPRESSION: Successful ultrasound-guided core  biopsy of a right hepatic lesion. Electronically Signed   By: Markus Daft M.D.   On: 10/05/2016 17:27    ASSESSMENT AND PLAN:   Principal Problem:   Metastatic lung cancer (metastasis from lung to other site) [facility]) Active Problems:   Hypomagnesemia   Pressure injury of skin   Liver lesion   Palliative care by specialist   Goals of care, counseling/discussion   DNR (do not resuscitate) discussion   Metastatic cancer ([place])   Atrial fibrillation with RVR ([place])   Hypotension   #84.61 year old female patient with generalized weakness and found to have extensive metastatic disease in the  liver, retroperitoneal nodes, left renal mass, also have mets in vertebra,  possible right lung cancer Onco consult appreciated.  Requested US guided biopsy.Done 10/05/16, Awaited results.  #2 UTI: Started on IV Rocephin, As per Urine culture- > 100,000 staphylococcus.   Change to oral keflex. Give total 5 days.  #3  for newly diagnosed metastatic cancer patient - Follow oncology recommendations.   Awaited biopsy result.    Need radiation as she have a mass pressing on SVC.  #4.L4 superior endplate fracture, with nonacute appearance.    As per MRI mets in vertebra  Ortho consult.  physical therapy consult.   As per Dr.Menz, pt does not need kyphoplasty this time, unless need bone biopsy.   #5 Hx of A fib with Ch diastolic CHF now with RVR and low BP   Was on oral meds, cardizem + metoprolol.     Held Eliquis, as needed Biopsy per oncology. Resumed 10/05/16    Now tachycardiac- likely some dehydration in setting of keeping NPO.   IV fluids and increased dose of cardizem oral. But BP further low.   Called cardio consult, start digoxin, transfer to stepdown unit as full code.   Fluid boluses given.    #  6 Diabetes   Was on metformin, insulin sliding scale coverage.   Stop metformin as kidney func is getting worse, likely due to hypotension.  #7 Hypertension  metoprolol, lisinopril and cardizem all  held duet o low BP with a fib and RVR.  PT suggest SNF. Critical today with RVR and low BP, transfer to step down unit. I discussed with her daughter in hospital, about all finding and very poor prognosis, and offered option of palliative care, she will meet with nurse today.FUll code for now.   All the records are reviewed and case discussed with Care Management/Social Workerr. Management plans discussed with the patient, family and they are in agreement.  CODE STATUS: Full.  TOTAL TIME TAKING CARE OF THIS PATIENT: 45 critical care minutes.   POSSIBLE D/C IN 1-2 DAYS, DEPENDING ON CLINICAL CONDITION.   Vaughan Basta M.D on 10/06/2016   Between 7am to 6pm - Pager - [phone]  After 6pm go to [email or web address] - password [email or web address]  Office  [phone]  CC: Primary care physician; Donnie Coffin, MD  Note: This dictation was prepared with Dragon dictation along with smaller phrase technology. Any transcriptional errors that result from this process are unintentional.

## 2016-10-06 NOTE — Progress Notes (Signed)
Family Meeting Note  Advance Directive:yes  Today a meeting took place with the Patient and her daughter.  The following clinical team members were present during this meeting:MD  The following were discussed:Patient's diagnosis: metastatic cancer, A fib, Hypotension, renal failure, malnutrition , Patient's progosis: < 6 months and Goals for treatment: Full Code  Additional follow-up to be provided: Pallaitive care  Time spent during discussion:20 minutes  Megan Basta, MD

## 2016-10-06 NOTE — Plan of Care (Addendum)
  MD making rounds. Received order to transfer to stepdown. Room 8 assigned for transfer. Attempted to call report, but nurse unable to take report.  Attempted to call report again, unable to give report. Reassigned to room 6, but nurse wants status reevaluated. Hypotensive.  Spoke with MD, order remains to transfer to stepdown. Attempted to call report, nurse unable to call report. Spoke with nurse supervisor regarding reason patient still on 1C and MD still wants patient to transfer. Reassigned to Room 2. Attempted to call report, but nurse unable to take report.   Night Shift Beth, RN received report without delay and to call 1C Charge Nurse when Room 2 clean and ready for patient.

## 2016-10-06 NOTE — Progress Notes (Signed)
New patient eMD eval  ON camera - looks stable HR 118, RR 23, pulse ox 95%, MAP 60   I/O last 3 completed shifts: In: 2001.7 [P.O.:240; I.V.:1761.7] Out: 300 [Urine:300]   PULMONARY No results for input(s): PHART, PCO2ART, PO2ART, HCO3, TCO2, O2SAT in the last 168 hours.  Invalid input(s): PCO2, PO2  CBC  Recent Labs Lab 10/01/16 1408 10/02/16 0458 10/06/16 1126  HGB 13.1 12.0 10.4*  HCT 38.5 37.3 30.7*  WBC 12.2* 9.2 9.4  PLT 337 228 226    COAGULATION  Recent Labs Lab 10/02/16 1128 10/04/16 0858  INR 1.35 1.04    CARDIAC   Recent Labs Lab 10/01/16 1408  TROPONINI 0.04*   No results for input(s): PROBNP in the last 168 hours.   CHEMISTRY  Recent Labs Lab 10/01/16 1408 10/02/16 0458 10/05/16 0412 10/06/16 1126  NA 135 136 140 138  K 4.0 3.7 3.8 4.1  CL 91* 94* 96* 100*  CO2 31 32 31 27  GLUCOSE 385* 361* 98 165*  BUN 38* 37* 33* 43*  CREATININE 1.14* 1.08* 1.11* 1.64*  CALCIUM 9.0 8.6* 8.2* 7.6*  MG  --   --   --  1.4*   Estimated Creatinine Clearance: 33.1 mL/min (by C-G formula based on SCr of 1.64 mg/dL (H)).   LIVER  Recent Labs Lab 10/02/16 1128 10/04/16 0858  INR 1.35 1.04     INFECTIOUS No results for input(s): LATICACIDVEN, PROCALCITON in the last 168 hours.   ENDOCRINE CBG (last 3)   Recent Labs  10/06/16 1655 10/06/16 2022 10/06/16 2126  GLUCAP 123* 82 78         IMAGING x48h  - image(s) personally visualized  -   highlighted in bold Dg Chest 2 View  Result Date: 10/06/2016 CLINICAL DATA:  Hypoxia.  History of breast carcinoma EXAM: CHEST  2 VIEW COMPARISON:  Chest radiograph October 01, 2016 and chest CT October 03, 2016 FINDINGS: There is opacification of most of the right with combination of consolidation, extensive adenopathy, and pleural effusion. There has been progression of consolidation effusion on the right compared to recent studies. Left lung is clear. Heart size is normal. The left hilum and  mediastinal region appears unremarkable. There is atherosclerotic calcification in the aorta. No obvious bony lesions are evident by radiography. The areas of concern for neoplastic focus in the sternum not appreciable by radiography. IMPRESSION: Extensive opacification throughout most of the right lung with progression from recent studies. The appearance is consistent with a combination of pleural effusion, extensive adenopathy comment consolidation. CT shows widespread neoplastic change throughout the right lung. Left lung appears clear. Stable cardiac silhouette. Aortic atherosclerosis. Electronically Signed   By: Lowella Grip III M.D.   On: 10/06/2016 10:22   US Biopsy  Result Date: 10/05/2016 INDICATION: 61 year old female with evidence of metastatic disease in the chest, abdomen and bones. Tissue diagnosis is needed. EXAM: ULTRASOUND-GUIDED LIVER LESION BIOPSY MEDICATIONS: None. ANESTHESIA/SEDATION: No sedation was used. The patient's vital signs were monitored continuously by radiology nursing throughout the procedure under my direct supervision. FLUOROSCOPY TIME:  None COMPLICATIONS: None immediate. PROCEDURE: Informed written consent was obtained from the patient after a thorough discussion of the procedural risks, benefits and alternatives. All questions were addressed. A timeout was performed prior to the initiation of the procedure. Liver was evaluated with ultrasound. There are subtle lesions scattered throughout the liver. A lesion in the right hepatic lobe was selected for biopsy. Abdomen was prepped and draped in sterile fashion.  Skin and soft tissues were anesthetized with 1% lidocaine. Using ultrasound guidance, 17 gauge needle was directed into the right hepatic lobe lesion. A total of 4 core biopsies were obtained with an 18 gauge core device. Three of the core samples were adequate and placed in formalin. 17 gauge needle was removed without complication. Bandage placed over the puncture  site. FINDINGS: Subtle lesions throughout the liver. Needle position confirmed within a right hepatic lesion. No significant bleeding or hematoma formation following the core biopsies. IMPRESSION: Successful ultrasound-guided core biopsy of a right hepatic lesion. Electronically Signed   By: Markus Daft M.D.   On: 10/05/2016 17:27    A) soft bp B) rising creat C) metastatic lung cancer D) low mag  Plan Fluid bolus500cc Mag sulfate 2gm   Dr. Brand Males, M.D., [facility].C.P Pulmonary and Critical Care Medicine Staff Physician [place] [facility] Pager: [phone], If no answer or between  15:00h - 7:00h: call [phone]  10/06/2016 10:35 PM

## 2016-10-06 NOTE — NC FL2 (Signed)
[place] LEVEL OF CARE SCREENING TOOL     IDENTIFICATION  Patient Name: Megan Cisneros Birthdate: 1955/10/25 Sex: female Admission Date (Current Location): 10/01/2016  [place] and [place] Number:  Engineering geologist and Address:  [facility], [address], [place], [place] [address]      Provider Number: [id number]  Attending Physician Name and Address:  Vaughan Basta, MD  Relative Name and Phone Number:  Lince,Francine Daughter [phone]     Current Level of Care: Hospital Recommended Level of Care: [place] Prior Approval Number:    Date Approved/Denied:   PASRR Number: [id number]   Discharge Plan: SNF    Current Diagnoses: Patient Active Problem List   Diagnosis Date Noted  . Liver lesion   . Palliative care by specialist   . Goals of care, counseling/discussion   . DNR (do not resuscitate) discussion   . Pressure injury of skin 10/02/2016  . Metastatic lung cancer (metastasis from lung to other site) ([place]) 10/01/2016  . UTI (urinary tract infection) 08/06/2016  . Tinea corporis 08/06/2016  . Muscle weakness (generalized)   . Notalgia   . Hypokalemia 08/05/2016  . Hypomagnesemia 08/05/2016  . Cellulitis of left lower extremity   . Chest pain   . Essential hypertension 01/19/2016  . Acute on chronic diastolic CHF (congestive heart failure) ([place])   . Shortness of breath   . Bilateral leg edema   . SOB (shortness of breath)   . Morbid obesity due to excess calories ([place])   . CHF (congestive heart failure) ([place]) 12/11/2015    Orientation RESPIRATION BLADDER Height & Weight     Self, Time, Situation, Place  O2 (4L) Continent Weight: 168 lb 10.4 oz (76.5 kg) Height:  5' (152.4 cm)  BEHAVIORAL SYMPTOMS/MOOD NEUROLOGICAL BOWEL NUTRITION STATUS      Continent Diet (Carb Modified)  AMBULATORY STATUS COMMUNICATION OF NEEDS Skin   Limited Assist Verbally PU Stage and Appropriate Care PU Stage 1  Dressing:  (PRN dressing change)                     Personal Care Assistance Level of Assistance  Bathing, Feeding, Dressing Bathing Assistance: Limited assistance Feeding assistance: Independent Dressing Assistance: Limited assistance     Functional Limitations Info  Sight, Hearing, Speech Sight Info: Adequate Hearing Info: Adequate Speech Info: Adequate    SPECIAL CARE FACTORS FREQUENCY  PT (By licensed PT)     PT Frequency: 5x a week              Contractures Contractures Info: Not present    Additional Factors Info  Code Status, Allergies, Insulin Sliding Scale Code Status Info: Full Code Allergies Info: NKA   Insulin Sliding Scale Info: insulin aspart (novoLOG) injection 0-9 Units 3x a day with meals       Current Medications (10/06/2016):  This is the current hospital active medication list Current Facility-Administered Medications  Medication Dose Route Frequency Provider Last Rate Last Dose  . 0.9 %  sodium chloride infusion   Intravenous Continuous Arne Cleveland, MD      . 0.9 %  sodium chloride infusion   Intravenous Continuous Vaughan Basta, MD 100 mL/hr at 10/05/16 1944    . acetaminophen (TYLENOL) tablet 650 mg  650 mg Oral Q6H PRN Epifanio Lesches, MD       Or  . acetaminophen (TYLENOL) suppository 650 mg  650 mg Rectal Q6H PRN Epifanio Lesches, MD      .  albuterol (PROVENTIL) (2.5 MG/3ML) 0.083% nebulizer solution 2.5 mg  2.5 mg Inhalation Q4H PRN Epifanio Lesches, MD      . apixaban (ELIQUIS) tablet 5 mg  5 mg Oral BID Vaughan Basta, MD      . atorvastatin (LIPITOR) tablet 80 mg  80 mg Oral q1800 Epifanio Lesches, MD   80 mg at 10/05/16 2156  . bisacodyl (DULCOLAX) EC tablet 5 mg  5 mg Oral Daily PRN Epifanio Lesches, MD      . cephALEXin (KEFLEX) capsule 500 mg  500 mg Oral Q12H Vaughan Basta, MD   500 mg at 10/05/16 2156  . digoxin (LANOXIN) 0.25 MG/ML injection 0.5 mg  0.5 mg Intravenous Once Ryan M Dunn,  PA-C      . docusate sodium (COLACE) capsule 100 mg  100 mg Oral BID Epifanio Lesches, MD   100 mg at 10/05/16 2157  . feeding supplement (ENSURE ENLIVE) (ENSURE ENLIVE) liquid 237 mL  237 mL Oral BID BM Vaughan Basta, MD   237 mL at 10/03/16 1010  . gabapentin (NEURONTIN) capsule 100 mg  100 mg Oral BID Alexis Hugelmeyer, DO   100 mg at 10/05/16 2156  . HYDROcodone-acetaminophen (NORCO/VICODIN) 5-325 MG per tablet 1-2 tablet  1-2 tablet Oral Q4H PRN Epifanio Lesches, MD   1 tablet at 10/05/16 0606  . insulin aspart (novoLOG) injection 0-9 Units  0-9 Units Subcutaneous TID WC Vaughan Basta, MD   2 Units at 10/06/16 [phone]  . insulin detemir (LEVEMIR) injection 5 Units  5 Units Subcutaneous QHS Epifanio Lesches, MD   5 Units at 10/05/16 2157  . metFORMIN (GLUCOPHAGE) tablet 1,000 mg  1,000 mg Oral BID WC Epifanio Lesches, MD   1,000 mg at 10/06/16 0834  . multivitamin with minerals tablet 1 tablet  1 tablet Oral Daily Epifanio Lesches, MD   1 tablet at 10/05/16 0903  . ondansetron (ZOFRAN) tablet 4 mg  4 mg Oral Q6H PRN Epifanio Lesches, MD       Or  . ondansetron (ZOFRAN) injection 4 mg  4 mg Intravenous Q6H PRN Epifanio Lesches, MD   4 mg at 10/02/16 1314  . oxybutynin (DITROPAN) tablet 5 mg  5 mg Oral BID Epifanio Lesches, MD   5 mg at 10/05/16 2157  . pantoprazole (PROTONIX) EC tablet 40 mg  40 mg Oral Daily Epifanio Lesches, MD   40 mg at 10/05/16 0903  . predniSONE (DELTASONE) tablet 1 mg  1 mg Oral Q breakfast Vaughan Basta, MD   1 mg at 10/06/16 0835  . sodium chloride 0.9 % bolus 500 mL  500 mL Intravenous Once Ryan M Dunn, PA-C      . traZODone (DESYREL) tablet 100 mg  100 mg Oral BID Epifanio Lesches, MD   100 mg at 10/05/16 2158  . traZODone (DESYREL) tablet 25 mg  25 mg Oral QHS PRN Epifanio Lesches, MD         Discharge Medications: Please see discharge summary for a list of discharge medications.  Relevant Imaging  Results:  Relevant Lab Results:   Additional Information SSN [id number]  Ross Ludwig, [place]

## 2016-10-06 NOTE — Progress Notes (Addendum)
Pt has order to transfer to stepdown. Report given by off going RN to CCU nurse. Initial room assignment was ICU 02. The room assignment was changed again. So, this Probation officer spoke with [employer] and [place]. She said that pt was going to be transferred to ICU 19 once room was cleaned.

## 2016-10-07 DIAGNOSIS — C3490 Malignant neoplasm of unspecified part of unspecified bronchus or lung: Secondary | ICD-10-CM

## 2016-10-07 DIAGNOSIS — N170 Acute kidney failure with tubular necrosis: Secondary | ICD-10-CM

## 2016-10-07 DIAGNOSIS — I4891 Unspecified atrial fibrillation: Secondary | ICD-10-CM

## 2016-10-07 DIAGNOSIS — C3491 Malignant neoplasm of unspecified part of right bronchus or lung: Secondary | ICD-10-CM

## 2016-10-07 DIAGNOSIS — Z7901 Long term (current) use of anticoagulants: Secondary | ICD-10-CM

## 2016-10-07 DIAGNOSIS — Z66 Do not resuscitate: Secondary | ICD-10-CM

## 2016-10-07 DIAGNOSIS — C801 Malignant (primary) neoplasm, unspecified: Secondary | ICD-10-CM

## 2016-10-07 DIAGNOSIS — I959 Hypotension, unspecified: Secondary | ICD-10-CM

## 2016-10-07 DIAGNOSIS — Z806 Family history of leukemia: Secondary | ICD-10-CM

## 2016-10-07 DIAGNOSIS — N179 Acute kidney failure, unspecified: Secondary | ICD-10-CM

## 2016-10-07 LAB — BASIC METABOLIC PANEL
ANION GAP: 12 (ref 5–15)
BUN: 46 mg/dL — ABNORMAL HIGH (ref 6–20)
CALCIUM: 7.8 mg/dL — AB (ref 8.9–10.3)
CHLORIDE: 104 mmol/L (ref 101–111)
CO2: 24 mmol/L (ref 22–32)
Creatinine, Ser: 1.81 mg/dL — ABNORMAL HIGH (ref 0.44–1.00)
GFR calc non Af Amer: 29 mL/min — ABNORMAL LOW (ref >60)
GFR, EST AFRICAN AMERICAN: 33 mL/min — AB (ref >60)
Glucose, Bld: 79 mg/dL (ref 65–99)
Potassium: 4.1 mmol/L (ref 3.5–5.1)
Sodium: 140 mmol/L (ref 135–145)

## 2016-10-07 LAB — GLUCOSE, CAPILLARY
Glucose-Capillary: 112 mg/dL — ABNORMAL HIGH (ref 65–99)
Glucose-Capillary: 192 mg/dL — ABNORMAL HIGH (ref 65–99)

## 2016-10-07 LAB — SURGICAL PATHOLOGY

## 2016-10-07 MED ORDER — AMIODARONE HCL IN DEXTROSE 360-4.14 MG/200ML-% IV SOLN
60.0000 mg/h | INTRAVENOUS | Status: DC
  Administered 2016-10-07 (×2): 60 mg/h via INTRAVENOUS
  Filled 2016-10-07 (×2): qty 200

## 2016-10-07 MED ORDER — AMIODARONE HCL IN DEXTROSE 360-4.14 MG/200ML-% IV SOLN
60.0000 mg/h | INTRAVENOUS | Status: DC
  Administered 2016-10-07: 30 mg/h via INTRAVENOUS
  Administered 2016-10-08: 60 mg/h via INTRAVENOUS
  Filled 2016-10-07 (×6): qty 200

## 2016-10-07 MED ORDER — ENSURE ENLIVE PO LIQD
237.0000 mL | Freq: Three times a day (TID) | ORAL | Status: DC
  Administered 2016-10-07 – 2016-10-09 (×6): 237 mL via ORAL

## 2016-10-07 MED ORDER — AMIODARONE LOAD VIA INFUSION
150.0000 mg | Freq: Once | INTRAVENOUS | Status: AC
  Administered 2016-10-07: 150 mg via INTRAVENOUS
  Filled 2016-10-07: qty 83.34

## 2016-10-07 NOTE — Progress Notes (Signed)
Dr. Rockey Situ made aware of patient's lower BPs throughout the day.  Per MD, increase amio drip and continue at '60mg'$ /hr and decrease IVF to prevent volume overload.  Dr. Rockey Situ states he is ok with the SBP >90.

## 2016-10-07 NOTE — Consult Note (Signed)
[place] Nurse wound consult note Reason for Consult:Stage 2 pressure injury.  Pressure and moisture.  Palliative care at this time.   Wound type:Moisture and Pressure Pressure Injury POA: Yes Measurement: 2 cm x 2 cm with 0.2 cm denuded center at coccyx.  Wound EXB:MWUX and moist Drainage (amount, consistency, odor) scant serous Periwound:blanchable erythema Dressing procedure/placement/frequency:Cleanse sacrum with soap and water.  Allevyn silicone foam per protocol.  Keep skin clean and dry.  Will not follow at this time.  Please re-consult if needed.  Domenic Moras RN BSN [place] Pager [phone]

## 2016-10-07 NOTE — Care Management (Addendum)
[facility] and [facility]. [facility] historically patient has accepted HHPT but declines [place] visit. PT is recommending SNF at this time. CSW following. RNCM will follow along.

## 2016-10-07 NOTE — Progress Notes (Addendum)
Patient has been sleeping for the majority of this writer's shift. Only pain complaint has been to her sacral area where there is a stage II ulcer, covered w/ pink foam dressing per wound care RN. Pt has consumed 3/4 of 2 Ensure shakes today. Palliative care saw the patient - advance directives and DNR completed. Cardiology saw patient - IV Amiodarone initiated, maintenance fluids decreased from 150 to 100 mL/hr. Patient has had no urinary output or stools during this shift. Will continue to monitor.

## 2016-10-07 NOTE — Progress Notes (Signed)
  PT Cancellation Note  Patient Details Name: Megan Cisneros MRN: [id number] DOB: 19-Jan-1956   Cancelled Treatment:    Reason Eval/Treat Not Completed: Other (comment). Pt transferred to CCU at this time secondary to change in medical status. PT will now need new orders to resume care. Will dc current orders. Please re-order when medically stable.   Christo Hain 10/07/2016, 8:19 AM Greggory Stallion, PT, DPT [phone]

## 2016-10-07 NOTE — Progress Notes (Signed)
Nutrition Follow-up  DOCUMENTATION CODES:   Severe malnutrition in context of chronic illness  INTERVENTION:  -Pt only able to tolerate soft, liquid foods like pudding, ice cream, soup and jello; recommend downgrading diet to Dysphagia III with liberalization of diet restrictions (removal of heart healthy/carb modified). Cater to pt preferences -Recommend increasing Ensure Enlive to QID, each supplement provides 350 kcal and 20 grams of protein -Recommend Magic cup BID with meals, each supplement provides 290 kcal and 9 grams of protein  NUTRITION DIAGNOSIS:   Malnutrition related to chronic illness as evidenced by moderate depletions of muscle mass, severe depletion of muscle mass, percent weight loss, energy intake < 75% for > or equal to 1 month.  Continues but being addressed via diet modification/liberalization, supplementation  GOAL:   Patient will meet greater than or equal to 90% of their needs  MONITOR:   PO intake, I & O's, Labs, Weight trends, Supplement acceptance  REASON FOR ASSESSMENT:   Malnutrition Screening Tool    ASSESSMENT:   Megan Cisneros  is a 61 y.o. female with a known history ofAtrial fibrillation, diabetes mellitus type 2, essential hypertension, morbid obesity, PVD, history of remote breast cancer 20 years ago comes in because of generalized weakness, history of fall today.   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 Noted any treatment at this time would be for palliation only, palliative care following  Pt reports poor appetite; drinking EnsureEnlive well. Pt reports she can drink at least 3 per day but has to have served over ice. RN aware.  Pt reports she can only tolerate soft, liquid foods like pudding, ice cream, soup, jello. No solid foods at present  Labs: reviewed Meds: NS at 100 ml/hr, MVI, ss novolog, levemir, prednisone  Diet Order:  DIET DYS 3 Room service  appropriate? Yes; Fluid consistency: Thin  Skin:  Wound (see comment) (stage II on buttock per wound care)  Last BM:  10/06/16  Height:   Ht Readings from Last 1 Encounters:  10/01/16 5' (1.524 m)    Weight:   Wt Readings from Last 1 Encounters:  10/06/16 173 lb 15.1 oz (78.9 kg)    Ideal Body Weight:  45.45 kg  BMI:  Body mass index is 33.97 kg/m.  Estimated Nutritional Needs:   Kcal:  2260-2640 calories (30-35 cal/kg)  Protein:  98-128 gm  Fluid:  >/= 2.2L  EDUCATION NEEDS:   No education needs identified at this time  [place], [place], [place] [phone] Pager  [phone] Weekend/On-Call Pager

## 2016-10-07 NOTE — Clinical Social Work Note (Signed)
CSW spoke with patient and her niece who was at bedside, patient would prefer to go to [facility].  CSW continuing to follow patient's progress throughout discharge planning.  Jones Broom. [place], MSW, [place]  10/07/2016 8:38 AM

## 2016-10-07 NOTE — Progress Notes (Signed)
CH responded at an OR for an AD. Pt was educated and wished to complete. [place] gathered the Notary and two witnesses. CH made a copy of the AD and placed in the Pt Chart. AD is completed. CH is available for follow up as needed.    10/07/16 1000  Clinical Encounter Type  Visited With Patient;Patient and family together;Health care provider  Visit Type Initial;Spiritual support  Referral From Nurse  Consult/Referral To Chaplain  Spiritual Encounters  Spiritual Needs Literature;Prayer

## 2016-10-07 NOTE — Progress Notes (Signed)
Daily Progress Note   Patient Name: Megan Cisneros       Date: 10/07/2016 DOB: September 24, 1955  Age: 61 y.o. MRN#: [id number] Attending Physician: Max Sane, MD Primary Care Physician: Donnie Coffin, MD Admit Date: 10/01/2016  Reason for Consultation/Follow-up: Establishing goals of care  Subjective: Met with patient and daughter at bedside. Patient is awake, alert, and pleasant this morning. She asks for ensure.  Introduced Teacher, adult education of palliative care to daughter, Andris Flurry. Discussed diagnoses, prognosis, GOC, and EOL wishes. Daughter has a good understanding of diagnosis and poor prognosis. She understands time is short but also "not ready to completely give up" on her mother. She is hopeful that she will be able to tolerate radiation. Daughter would like to increase her nutritional status in order to "make her stronger for chemo" and "make her better." I discussed that at this point, with extensive malignancy, radiation and chemo are for palliation and NOT cure. She is currently very weak and deconditioned for chemo and explained that this may be more harm than good to her at the end of her life. Patient would not want chemo, knowing she is too weak. Daughter wants to "take it one day at a time." See how she does with radiation and then go from there.  Today, patient is firm on her decision of not wanting to be resuscitated or on a breathing machine. Her daughter states "I'm selfish and would want her to be resuscitated and on breathing machine" but after talking with her mother, respects her wishes of DNR/DNI and no life prolonging measures (feeding tube) at the end of her life. She has paperwork that needs to be notarized. She is interested in speaking with chaplain about completing advanced directives.    Daughter asks about rehab. If stable for discharge to rehab, the patient and family agree with palliative services to follow and understand this can transition to hospice services when they are ready. Daughter understands at this point, not stable for discharge due to afib RVR /hypotension and concern of worsening kidney functions that need to be monitored.   Patient and daughter appreciative of palliative consult. Hard Choices copy given. Questions and concerns answered.   Length of Stay: 6  Current Medications: Scheduled Meds:  . apixaban  5 mg Oral BID  . atorvastatin  80 mg Oral  q1800  . cephALEXin  500 mg Oral Q12H  . docusate sodium  100 mg Oral BID  . feeding supplement (ENSURE ENLIVE)  237 mL Oral BID BM  . gabapentin  100 mg Oral BID  . insulin aspart  0-9 Units Subcutaneous TID WC  . insulin detemir  5 Units Subcutaneous QHS  . multivitamin with minerals  1 tablet Oral Daily  . oxybutynin  5 mg Oral BID  . pantoprazole  40 mg Oral Daily  . traZODone  100 mg Oral BID    Continuous Infusions: . sodium chloride    . sodium chloride 150 mL/hr at 10/06/16 1718    PRN Meds: acetaminophen **OR** acetaminophen, albuterol, bisacodyl, HYDROcodone-acetaminophen, ondansetron **OR** ondansetron (ZOFRAN) IV, traZODone  Physical Exam  Constitutional: She is oriented to person, place, and time. She appears ill.  HENT:  Head: Normocephalic and atraumatic.  Cardiovascular: An irregularly irregular rhythm present.  afib 120's on monitor  Pulmonary/Chest: Effort normal. She has decreased breath sounds.  Abdominal: Soft. Normal appearance and bowel sounds are normal. There is no tenderness.  Neurological: She is alert and oriented to person, place, and time.  Skin: Skin is warm and dry. There is pallor.  Psychiatric: She has a normal mood and affect. Her speech is normal and behavior is normal. Cognition and memory are normal.  Nursing note and vitals reviewed.          Vital  Signs: BP (!) 100/52   Pulse (!) 119   Temp 99.2 F (37.3 C)   Resp (!) 27   Ht 5' (1.524 m)   Wt 78.9 kg (173 lb 15.1 oz)   SpO2 93%   BMI 33.97 kg/m  SpO2: SpO2: 93 % O2 Device: O2 Device: Nasal Cannula O2 Flow Rate: O2 Flow Rate (L/min): 4 L/min  Intake/output summary:   Intake/Output Summary (Last 24 hours) at 10/07/16 0910 Last data filed at 10/07/16 0400  Gross per 24 hour  Intake          3509.17 ml  Output                0 ml  Net          3509.17 ml   LBM: Last BM Date: 10/06/16 Baseline Weight: Weight: 76.7 kg (169 lb) Most recent weight: Weight: 78.9 kg (173 lb 15.1 oz)       Palliative Assessment/Data: PPS 40%   Flowsheet Rows   Flowsheet Row Most Recent Value  Intake Tab  Referral Department  Hospitalist  Unit at Time of Referral  Oncology Unit  Palliative Care Primary Diagnosis  Cancer  Date Notified  10/04/16  Palliative Care Type  New Palliative care  Reason for referral  Clarify Goals of Care  Date of Admission  10/01/16  Date first seen by Palliative Care  10/05/16  # of days Palliative referral response time  1 Day(s)  # of days IP prior to Palliative referral  3  Clinical Assessment  Palliative Performance Scale Score  40%  Psychosocial & Spiritual Assessment  Palliative Care Outcomes  Patient/Family meeting held?  Yes  Who was at the meeting?  patient and daughter  Palliative Care Outcomes  Clarified goals of care, Provided end of life care assistance, ACP counseling assistance, Changed CPR status, Completed durable DNR      Patient Active Problem List   Diagnosis Date Noted  . Atrial fibrillation with RVR ([place]) 10/06/2016  . Hypotension 10/06/2016  . Metastatic cancer ([place])   .  Liver lesion   . Palliative care by specialist   . Goals of care, counseling/discussion   . DNR (do not resuscitate) discussion   . Pressure injury of skin 10/02/2016  . Metastatic lung cancer (metastasis from lung to other site) ([place]) 10/01/2016  . UTI  (urinary tract infection) 08/06/2016  . Tinea corporis 08/06/2016  . Muscle weakness (generalized)   . Notalgia   . Hypokalemia 08/05/2016  . Hypomagnesemia 08/05/2016  . Cellulitis of left lower extremity   . Chest pain   . Essential hypertension 01/19/2016  . Acute on chronic diastolic CHF (congestive heart failure) ([place])   . Shortness of breath   . Bilateral leg edema   . SOB (shortness of breath)   . Morbid obesity due to excess calories ([place])   . CHF (congestive heart failure) ([place]) 12/11/2015    Palliative Care Assessment & Plan   Patient Profile: 61 y.o. female  with past medical history of CVA, PVD, essential  hypertension, DM, diastolic CHF, afib/flutter, and breast cancer admitted on 10/01/2016 with weakness, fall, rectal bleeding, and weight loss. Complains of rectal bleeding for 1 month without evidence of abdominal pain. Per daughter, patient has lost about 20 lbs in 2 months and with decreased appetite. CT chest/pelvis reveals hilar/mediastinal adenopathy concerning for superior vena cava compression, multiple liver lesions, retroperitoneal lymph nodes, and left-sided kidney mass approximately 4cm. Evaluated by Dr. Gay Filler for primary lung/small cell with metastases to liver. Plan for US liver biopsy today, 2/6. Radiation oncology consulted and plans to start palliative radiation therapy to chest. Palliative medicine consultation for goals of care. On 2/7, patient now in afib RVR and hypotensive (66/41). Cardiology consulted. Patient transferred to stepdown.   Assessment: Generalized weakness Metastatic cancer-possible right lung, liver, retroperitoneal nodes, and left renal mass UTI L4 fracture (? Pathologic) Afib Diastolic CHF  Recommendations/Plan:  Discussed and educated on code status/advanced directives. DNR now. Durable DNR completed.   Chaplain consulted to assist daughter/patient with advanced directive paperwork.   Daughter and patient wanting to  pursue radiation if able to tolerate. They understand this is for palliation not cure.   Daughter hopeful for rehab after hospitalization. Agreeable with palliative services to follow.   PMT will continue to shadow chart and support patient and family through hospitalization.   Code Status: DNR   Code Status Orders        Start     Ordered   10/01/16 1945  Full code  Continuous     10/01/16 1947    Code Status History    Date Active Date Inactive Code Status Order ID Comments User Context   08/05/2016  1:47 PM 08/09/2016  8:09 PM Full Code [id number]  Gladstone Lighter, MD ED   12/12/2015 12:41 AM 12/13/2015  4:13 PM Full Code [id number]  Fritzi Mandes, MD ED       Prognosis:   Unable to determine guarded with new diagnosis of metastatic cancer-lung, liver, retroperitoneal nodes, and kidney mass. Also with history of stroke, afib, and CHF.  Discharge Planning:  To Be Determined  Care plan was discussed with patient, daughter, Dr. Rogue Bussing, and RN.  Thank you for allowing the Palliative Medicine Team to assist in the care of this patient.   Time In: 0800 Time Out: 0915 Total Time 28mn Prolonged Time Billed  yes      Greater than 50%  of this time was spent counseling and coordinating care related to the above assessment and plan.  MIhor Dow FNP-C Palliative  Medicine Team  Phone: [phone] Fax: [phone]  Please contact Palliative Medicine Team phone at [phone] for questions and concerns.

## 2016-10-07 NOTE — Progress Notes (Addendum)
Patient: Megan Cisneros / Admit Date: 10/01/2016 / Date of Encounter: 10/07/2016, 6:16 PM   Hospital Problem List     Principal Problem:   Metastatic lung cancer (metastasis from lung to other site) [facility]) Active Problems:   Hypomagnesemia   Pressure injury of skin   Liver lesion   Palliative care by specialist   Goals of care, counseling/discussion   DNR (do not resuscitate) discussion   Metastatic cancer ([place])   Atrial fibrillation with RVR ([place])   Hypotension   Malignancy ([place])   DNR (do not resuscitate)     Subjective   Reports that she is tired, some shortness of breath, Telemetry reviewed showing rapid atrial fibrillation with RVR No significant change over the past several days Borderline low blood pressure  Inpatient Medications    Scheduled Meds: . apixaban  5 mg Oral BID  . atorvastatin  80 mg Oral q1800  . cephALEXin  500 mg Oral Q12H  . docusate sodium  100 mg Oral BID  . feeding supplement (ENSURE ENLIVE)  237 mL Oral TID WC & HS  . gabapentin  100 mg Oral BID  . insulin aspart  0-9 Units Subcutaneous TID WC  . insulin detemir  5 Units Subcutaneous QHS  . multivitamin with minerals  1 tablet Oral Daily  . oxybutynin  5 mg Oral BID  . pantoprazole  40 mg Oral Daily  . traZODone  100 mg Oral BID   Continuous Infusions: . sodium chloride    . sodium chloride 100 mL/hr at 10/07/16 1700  . amiodarone 60 mg/hr (10/07/16 1700)   Followed by  . amiodarone     PRN Meds: acetaminophen **OR** acetaminophen, albuterol, bisacodyl, HYDROcodone-acetaminophen, ondansetron **OR** ondansetron (ZOFRAN) IV, traZODone   Objective: Telemetry: Atrial fibrillation with RVR Physical Exam: Blood pressure (!) 103/52, pulse (!) 108, temperature 98.6 F (37 C), temperature source Oral, resp. rate (!) 24, height 5' (1.524 m), weight 173 lb 15.1 oz (78.9 kg), SpO2 95 %. Body mass index is 33.97 kg/m. GEN: Well nourished, well developed, in no acute distress.  Sleeping/lethargic HEENT: Grossly normal.  Neck: Supple, no JVD, carotid bruits, or masses. Cardiac: Rapid rate, no murmurs, rubs, or gallops. No clubbing, cyanosis, edema.  Radials/DP/PT 2+ and equal bilaterally.  Respiratory:  Respirations regular and unlabored, clear to auscultation bilaterally. GI: Soft, nontender, nondistended, BS + x 4. MS: no deformity or atrophy. Skin: warm and dry, no rash. Neuro:  Not fully tested as she is lethargic Psych: Sleeping, arousable   Intake/Output Summary (Last 24 hours) at 10/07/16 1816 Last data filed at 10/07/16 1700  Gross per 24 hour  Intake          5359.17 ml  Output                0 ml  Net          5359.17 ml     Labs: CBC  Recent Labs  10/06/16 1126  WBC 9.4  HGB 10.4*  HCT 30.7*  MCV 86.3  PLT 409   Basic Metabolic Panel  Recent Labs  10/06/16 1126 10/07/16 0516  NA 138 140  K 4.1 4.1  CL 100* 104  CO2 27 24  GLUCOSE 165* 79  BUN 43* 46*  CREATININE 1.64* 1.81*  CALCIUM 7.6* 7.8*  MG 1.4*  --    Liver Function Tests No results for input(s): AST, ALT, ALKPHOS, BILITOT, PROT, ALBUMIN in the last 72 hours. No results for input(s): LIPASE,  AMYLASE in the last 72 hours. Cardiac Enzymes No results for input(s): CKTOTAL, CKMB, CKMBINDEX, TROPONINI in the last 72 hours. BNP Invalid input(s): POCBNP D-Dimer No results for input(s): DDIMER in the last 72 hours. Hemoglobin A1C No results for input(s): HGBA1C in the last 72 hours. Fasting Lipid Panel No results for input(s): CHOL, HDL, LDLCALC, TRIG, CHOLHDL, LDLDIRECT in the last 72 hours. Thyroid Function Tests No results for input(s): TSH, T4TOTAL, T3FREE, THYROIDAB in the last 72 hours.  Invalid input(s): FREET3  Weights: Filed Weights   10/05/16 0500 10/06/16 0500 10/06/16 2100  Weight: 165 lb 9.6 oz (75.1 kg) 168 lb 10.4 oz (76.5 kg) 173 lb 15.1 oz (78.9 kg)     Radiology/Studies:  Dg Chest 2 View  Result Date: 10/06/2016  IMPRESSION: Extensive  opacification throughout most of the right lung with progression from recent studies. The appearance is consistent with a combination of pleural effusion, extensive adenopathy comment consolidation. CT shows widespread neoplastic change throughout the right lung. Left lung appears clear. Stable cardiac silhouette. Aortic atherosclerosis. Electronically Signed   By: Lowella Grip III M.D.   On: 10/06/2016 10:22    Ct Head Wo Contrast  Result Date: 10/01/2016 . IMPRESSION: 1. No acute intracranial pathology. 2. Chronic microvascular disease and cerebral atrophy. Electronically Signed   By: Kathreen Devoid   On: 10/01/2016 17:24   Ct Chest W Contrast  Result Date: 10/03/2016  IMPRESSION: 1. Bulky mediastinal and right hilar lymphadenopathy. Imminent SVC occlusion. 2. Large right upper lobe lung mass and surrounding interstitial thickening suggesting interstitial spread of tumor. 3. Diffuse hepatic metastatic disease and bulky upper abdominal and retroperitoneal lymphadenopathy. 4. Complex enhancing partially calcified left upper pole renal mass most likely renal cell carcinoma. 5. Suspect sternal metastatic disease. I do not see any other obvious bone lesions. 6. Advanced atherosclerotic calcifications involving the thoracic and abdominal aorta and branch vessels. Electronically Signed   By: Marijo Sanes M.D.   On: 10/03/2016 16:26   Mr Lumbar Spine Wo Contrast  Result Date: 10/04/2016  IMPRESSION: 1. Diffuse osseous metastatic disease. 2. L4 superior endplate fracture with mild height loss favored to be chronic. 3. Moderate spinal stenosis and left greater than right lateral recess stenosis at L4-5. 4. Mild-to-moderate spinal stenosis at L3-4. Electronically Signed   By: Logan Bores M.D.   On: 10/04/2016 12:04   Ct Abdomen Pelvis W Contrast  Result Date: 10/03/2016  IMPRESSION: 1. Bulky mediastinal and right hilar lymphadenopathy. Imminent SVC occlusion. 2. Large right upper lobe lung mass and  surrounding interstitial thickening suggesting interstitial spread of tumor. 3. Diffuse hepatic metastatic disease and bulky upper abdominal and retroperitoneal lymphadenopathy. 4. Complex enhancing partially calcified left upper pole renal mass most likely renal cell carcinoma. 5. Suspect sternal metastatic disease. I do not see any other obvious bone lesions. 6. Advanced atherosclerotic calcifications involving the thoracic and abdominal aorta and branch vessels. Electronically Signed   By: Marijo Sanes M.D.   On: 10/03/2016 16:26   US Biopsy  Result Date: 10/05/2016  FINDINGS: Subtle lesions throughout the liver. Needle position confirmed within a right hepatic lesion. No significant bleeding or hematoma formation following the core biopsies. IMPRESSION: Successful ultrasound-guided core biopsy of a right hepatic lesion. Electronically Signed   By: Markus Daft M.D.   On: 10/05/2016 17:27   Ct Renal Stone Study  Result Date: 10/01/2016  IMPRESSION: 1. Extensive metastatic disease in the liver and retroperitoneal lymph nodes. Patient has history of breast cancer and there  is a 4 cm left renal mass compatible with renal cell carcinoma. 2. Asymmetric left bladder wall thickening. 3. L4 superior endplate fracture, with nonacute appearance. This may be pathologic. Electronically Signed   By: Monte Fantasia M.D.   On: 10/01/2016 17:57     Assessment and Plan  61 y.o. female  with history of DM and HTN, two TIA's and a stroke (2007 - [place]), s/p peripheral arterial bypass on the RLE in [place], [place] in 2004, chronic RLE edema, chronic DOE Atrial flutterApril 2017 , she did not want cardioversion when seen in May 2017, remained in atrial flutter when seen July 2017 stress testing prior to her LE bypass in 2004 reportedly nl. History of chronic lower extremity edema In the hospital 07/2016, atrial flutter with rapid ventricular response- acute on chronic diastolic CHF exacerbation- eliquis 5 bid  given CHA2DS2VASc of 7.  admitted with shortness of breath, fatigue, and malaise and found to have widely metastatic cancer   1. Afib/flutter with RVR/hypotension:  in the setting of her metastatic carcinoma  remained tachycardic since -BP soft  She received digoxin with no significant improvement in blood pressure -back on Eliquis now that she is s/p liver biopsy -If her tachycardia persists with hypotension she may require TEE/DCCV (Eliquis was interrupted for liver biopsy) -CHADS2VASc at least 5 (CHF, HTN, stroke x 2, female) -----Recommended we start amiodarone bolus with infusion  2. Chronic diastolic CHF: -Lasix on hold given hypotension Will decrease rate of IV fluids given her underlying tachycardia and risk of CHF  3. Metastatic carcinoma: -Status post liver biopsy  4. Hypomagnesemia: -Replete to 2.0  5. AKI: -Likely ATN 2/2 hypotension Gentle fluids  Discussion with nursing and PA on rounds today concerning management of her rapid atrial fibrillation  Provided detailed instructions on amiodarone bolus, infusion Can run bolus and slower given hypotension Extensive imaging reviewed  Total encounter time more than 35 minutes  Greater than 50% was spent in counseling and coordination of care with the patient  Signed, Esmond Plants, MD, Ph.D. [facility] 10/07/2016, 6:16 PM

## 2016-10-07 NOTE — Progress Notes (Signed)
[facility] CONSULT NOTE  Patient Care Team: Donnie Coffin, MD as PCP - General (Family Medicine) Minna Merritts, MD as Consulting Physician (Cardiology)  CHIEF COMPLAINTS/PURPOSE OF CONSULTATION: Possible metastatic disease  CC: Patient transferred to the ICU because of hypotension A. fib with RVR. She has been evaluated by cardiology. She had been simulated in radiation oncology for possible radiation.  Continues to have shortness of breath. No worsening cough. No chest pain.   ROS: no diarrhea. No abdominal pain. A complete 10 point review of system is done which is negative except mentioned above in history of present illness  MEDICAL HISTORY:  Past Medical History:  Diagnosis Date  . Atrial flutter ([place])    a. Dx 11/2015 -->CHA2DS2VASc = 7-->Eliquis '5mg'$  BID.  Marland Kitchen Breast cancer ([place])   . Chronic diastolic CHF (congestive heart failure) ([place])    a. 11/2015 Echo: EF 60-65%, no rwma, mild MR, mildly dil LA, nl RV fxn.  . Chronic Dyspnea on Exertion    a. 2004 Stress test prior to LE bypass->reportedly nl.  . Diabetes mellitus without complication ([place])    a. Dx as borderline diabetic in the 80's-->progressed over the years.  . Essential hypertension   . Lower extremity edema   . Morbid obesity ([place])   . PVC's (premature ventricular contractions)   . PVD (peripheral vascular disease) ([place])    a. 2004 s/p RLE bypass - complicated by recurrent infection.  . Stroke [facility])    a. TIA x 2;  b. Stroke in 2007 in [place].    SURGICAL HISTORY: Past Surgical History:  Procedure Laterality Date  . Cataract Surgery Right    03/2014  . CESAREAN SECTION    . ELECTROPHYSIOLOGIC STUDY N/A 08/09/2016   Procedure: CARDIOVERSION;  Surgeon: Wellington Hampshire, MD;  Location: [facility] ORS;  Service: Cardiovascular;  Laterality: N/A;  . LEG SURGERY    . MASTECTOMY Left   . 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 Hx of  smoking quit 20 years ago. Patient used to live in [place]. Currently lives in [place] with her daughter.   Social History   Social History  . Marital status: Widowed    Spouse name: N/A  . Number of children: N/A  . Years of education: N/A   Occupational History  . Not on file.   Social History Main Topics  . Smoking status: Former Research scientist (life sciences)  . Smokeless tobacco: Never Used  . Alcohol use No  . Drug use: No  . Sexual activity: Not on file   Other Topics Concern  . Not on file   Social History Narrative   Lives in [place] by herself.  Originally from [place], [place], later moved to [place] and then [place].  Has been in  for 8 yrs.  Family nearby.  Does not routinely exercise.   Has a cane and a walker    FAMILY HISTORY: Family History  Problem Relation Age of Onset  . Heart attack Father     died in his early 12's.  . Diabetes Father   . Cancer Mother     deceased  . Colon cancer Mother   . Cancer Brother     multiple cancers, deceased.  . Leukemia Brother   . Liver cancer Brother   . Pancreatic cancer Brother     ALLERGIES:  has No Known Allergies.  MEDICATIONS:  Current Facility-Administered Medications  Medication Dose Route  Frequency Provider Last Rate Last Dose  . 0.9 %  sodium chloride infusion   Intravenous Continuous Arne Cleveland, MD      . 0.9 %  sodium chloride infusion   Intravenous Continuous Minna Merritts, MD 100 mL/hr at 10/07/16 1428 100 mL/hr at 10/07/16 1428  . acetaminophen (TYLENOL) tablet 650 mg  650 mg Oral Q6H PRN Epifanio Lesches, MD       Or  . acetaminophen (TYLENOL) suppository 650 mg  650 mg Rectal Q6H PRN Epifanio Lesches, MD      . albuterol (PROVENTIL) (2.5 MG/3ML) 0.083% nebulizer solution 2.5 mg  2.5 mg Inhalation Q4H PRN Epifanio Lesches, MD   2.5 mg at 10/07/16 1610  . amiodarone (NEXTERONE) 1.8 mg/mL load via infusion 150 mg  150 mg Intravenous Once Rogelia Mire, NP       Followed by  . amiodarone (NEXTERONE  PREMIX) 360-4.14 MG/200ML-% (1.8 mg/mL) IV infusion  60 mg/hr Intravenous Continuous Rogelia Mire, NP       Followed by  . amiodarone (NEXTERONE PREMIX) 360-4.14 MG/200ML-% (1.8 mg/mL) IV infusion  30 mg/hr Intravenous Continuous Rogelia Mire, NP      . apixaban (ELIQUIS) tablet 5 mg  5 mg Oral BID Vaughan Basta, MD   5 mg at 10/07/16 1154  . atorvastatin (LIPITOR) tablet 80 mg  80 mg Oral q1800 Epifanio Lesches, MD   80 mg at 10/06/16 1717  . bisacodyl (DULCOLAX) EC tablet 5 mg  5 mg Oral Daily PRN Epifanio Lesches, MD      . cephALEXin (KEFLEX) capsule 500 mg  500 mg Oral Q12H Vaughan Basta, MD   500 mg at 10/07/16 1154  . docusate sodium (COLACE) capsule 100 mg  100 mg Oral BID Epifanio Lesches, MD   100 mg at 10/07/16 1121  . feeding supplement (ENSURE ENLIVE) (ENSURE ENLIVE) liquid 237 mL  237 mL Oral TID WC & HS Vipul Manuella Ghazi, MD      . gabapentin (NEURONTIN) capsule 100 mg  100 mg Oral BID Alexis Hugelmeyer, DO   100 mg at 10/07/16 1121  . HYDROcodone-acetaminophen (NORCO/VICODIN) 5-325 MG per tablet 1-2 tablet  1-2 tablet Oral Q4H PRN Epifanio Lesches, MD   2 tablet at 10/07/16 1121  . insulin aspart (novoLOG) injection 0-9 Units  0-9 Units Subcutaneous TID WC Vaughan Basta, MD   2 Units at 10/07/16 1206  . insulin detemir (LEVEMIR) injection 5 Units  5 Units Subcutaneous QHS Epifanio Lesches, MD   5 Units at 10/05/16 2157  . multivitamin with minerals tablet 1 tablet  1 tablet Oral Daily Epifanio Lesches, MD   1 tablet at 10/07/16 1121  . ondansetron (ZOFRAN) tablet 4 mg  4 mg Oral Q6H PRN Epifanio Lesches, MD       Or  . ondansetron (ZOFRAN) injection 4 mg  4 mg Intravenous Q6H PRN Epifanio Lesches, MD   4 mg at 10/02/16 1314  . oxybutynin (DITROPAN) tablet 5 mg  5 mg Oral BID Epifanio Lesches, MD   5 mg at 10/07/16 1120  . pantoprazole (PROTONIX) EC tablet 40 mg  40 mg Oral Daily Epifanio Lesches, MD   40 mg at 10/07/16  1121  . traZODone (DESYREL) tablet 100 mg  100 mg Oral BID Epifanio Lesches, MD   100 mg at 10/07/16 1121  . traZODone (DESYREL) tablet 25 mg  25 mg Oral QHS PRN Epifanio Lesches, MD          .  PHYSICAL EXAMINATION:  Vitals:   10/07/16 1400 10/07/16 1500  BP: (!) 66/45 (!) 97/47  Pulse: (!) 116 (!) 117  Resp: 16 14  Temp: 98.6 F (37 C)    Filed Weights   10/05/16 0500 10/06/16 0500 10/06/16 2100  Weight: 165 lb 9.6 oz (75.1 kg) 168 lb 10.4 oz (76.5 kg) 173 lb 15.1 oz (78.9 kg)    GENERAL: Well-nourished well-developed; Sleepy; no distress and comfortable.   She is Accompanied by daughter and niece.  EYES: no pallor or icterus OROPHARYNX: no thrush or ulceration. Poor dentition NECK: supple, no masses felt LYMPH:  no palpable lymphadenopathy in the cervical, axillary or inguinal regions LUNGS: decreased breath sounds to auscultation at bases and  No wheeze or crackles HEART/CVS: regular rate & rhythm and no murmurs; No lower extremity edema ABDOMEN: abdomen soft, non-tender and normal bowel sounds Musculoskeletal:no cyanosis of digits and no clubbing  PSYCH: sleepy. NEURO:unable to examine SKIN:  no rashes or significant lesions  LABORATORY DATA:  I have reviewed the data as listed Lab Results  Component Value Date   WBC 9.4 10/06/2016   HGB 10.4 (L) 10/06/2016   HCT 30.7 (L) 10/06/2016   MCV 86.3 10/06/2016   PLT 226 10/06/2016    Recent Labs  10/05/16 0412 10/06/16 1126 10/07/16 0516  NA 140 138 140  K 3.8 4.1 4.1  CL 96* 100* 104  CO2 '31 27 24  '$ GLUCOSE 98 165* 79  BUN 33* 43* 46*  CREATININE 1.11* 1.64* 1.81*  CALCIUM 8.2* 7.6* 7.8*  GFRNONAA 52* 33* 29*  GFRAA >60 38* 33*    RADIOGRAPHIC STUDIES: I have personally reviewed the radiological images as listed and agreed with the findings in the report. Dg Chest 1 View  Result Date: 10/01/2016 CLINICAL DATA:  Acute onset of generalized weakness. Initial encounter. EXAM: CHEST 1 VIEW  COMPARISON:  Chest radiographs performed 08/05/2016 and 12/11/2015 FINDINGS: Enlarging dense right hilar opacity is suspicious for malignancy. The left lung appears relatively clear. No pleural effusion or pneumothorax is seen. There is elevation of the right hemidiaphragm. The cardiomediastinal silhouette is normal in size. No acute osseous abnormalities are identified. IMPRESSION: Enlarging dense right hilar opacity is suspicious for malignancy. CT of the chest with contrast is recommended for further evaluation. Electronically Signed   By: Garald Balding M.D.   On: 10/01/2016 17:35   Dg Chest 2 View  Result Date: 10/06/2016 CLINICAL DATA:  Hypoxia.  History of breast carcinoma EXAM: CHEST  2 VIEW COMPARISON:  Chest radiograph October 01, 2016 and chest CT October 03, 2016 FINDINGS: There is opacification of most of the right with combination of consolidation, extensive adenopathy, and pleural effusion. There has been progression of consolidation effusion on the right compared to recent studies. Left lung is clear. Heart size is normal. The left hilum and mediastinal region appears unremarkable. There is atherosclerotic calcification in the aorta. No obvious bony lesions are evident by radiography. The areas of concern for neoplastic focus in the sternum not appreciable by radiography. IMPRESSION: Extensive opacification throughout most of the right lung with progression from recent studies. The appearance is consistent with a combination of pleural effusion, extensive adenopathy comment consolidation. CT shows widespread neoplastic change throughout the right lung. Left lung appears clear. Stable cardiac silhouette. Aortic atherosclerosis. Electronically Signed   By: Lowella Grip III M.D.   On: 10/06/2016 10:22   Dg Lumbar Spine 2-3 Views  Result Date: 10/02/2016 CLINICAL DATA:  Back pain and weakness for 2 days. EXAM:  LUMBAR SPINE - 2-3 VIEW COMPARISON:  CT scan October 01, 2016 FINDINGS: Scalloping  of the L4 superior endplate was better assessed on yesterday's CT scan and is unchanged. Degenerative changes in the lower thoracic spine. No other interval changes or bony abnormalities. IMPRESSION: No change since yesterday's CT scan. Continued scalloping of the L4 superior endplate. Electronically Signed   By: Dorise Bullion III M.D   On: 10/02/2016 17:33   Ct Head Wo Contrast  Result Date: 10/01/2016 CLINICAL DATA:  Weakness for 2 weeks EXAM: CT HEAD WITHOUT CONTRAST TECHNIQUE: Contiguous axial images were obtained from the base of the skull through the vertex without intravenous contrast. COMPARISON:  None. FINDINGS: Brain: No evidence of acute infarction, hemorrhage, extra-axial collection, ventriculomegaly, or mass effect. Generalized cerebral atrophy. Periventricular white matter low attenuation likely secondary to microangiopathy. Vascular: Cerebrovascular atherosclerotic calcifications are noted. Skull: Negative for fracture or focal lesion. Sinuses/Orbits: Visualized portions of the orbits are unremarkable. Visualized portions of the paranasal sinuses and mastoid air cells are unremarkable. Other: None. IMPRESSION: 1. No acute intracranial pathology. 2. Chronic microvascular disease and cerebral atrophy. Electronically Signed   By: Kathreen Devoid   On: 10/01/2016 17:24   Ct Chest W Contrast  Result Date: 10/03/2016 CLINICAL DATA:  Metastatic breast cancer EXAM: CT CHEST, ABDOMEN, AND PELVIS WITH CONTRAST TECHNIQUE: Multidetector CT imaging of the chest, abdomen and pelvis was performed following the standard protocol during bolus administration of intravenous contrast. CONTRAST:  169m ISOVUE-300 IOPAMIDOL (ISOVUE-300) INJECTION 61% COMPARISON:  CT scan 10/01/2016 FINDINGS: CT CHEST FINDINGS Chest wall: Status post left mastectomy. No right-sided breast masses are identified. No axillary lymphadenopathy. 8 mm supraclavicular lymph node noted on image 2. There also enlarge subclavicular lymph nodes on  the right side. The thyroid gland is grossly normal. Cardiovascular: The heart is within normal limits in size and stable. All stable dense atherosclerotic calcifications involving the aorta and branch vessels. No aneurysm or dissection. Three-vessel coronary artery calcifications are noted. Mediastinum/Nodes: There is extensive mediastinal and right hilar tumor. Large nodal masses throughout the mediastinum. The right paratracheal mass measures 4.8 x 3.9 cm on image 17. There is severe compression of the SVC but it is not completely occluded. Right hilar/ mediastinal mass on image number 25 measures 7.8 x 6.7 cm. Subcarinal nodal mass on image number 28 measures 34 x 30 mm. Lungs/Pleura: Lobulated right upper lobe lung mass on image number 65 measures 38 x 32 mm. Marked interstitial thickening on the right could suggest interstitial spread of tumor. The left lung is relatively clear. No acute pulmonary findings or worrisome pulmonary lesions. There are small bilateral pleural effusions. Musculoskeletal: Mixed lytic and sclerotic sternal lesions are suspicious for metastatic disease. No other obvious bone lesions. CT ABDOMEN PELVIS FINDINGS Hepatobiliary: Index lesion in the right lobe on image number 53 measures 8.6 x 4.7 cm. Index lesion in the lateral segment of the left hepatic lobe on image number 52 measures 5.4 x 4.1 cm. The gallbladder is grossly normal. A stable calcified gallstone is noted. Small amount of pericholecystic fluid. Pancreas: No mass, inflammation or ductal dilatation. Spleen: Normal size.  No focal lesions. Adrenals/Urinary Tract: Left adrenal gland lesions suspicious for metastasis. The right adrenal gland is normal. Complex enhancing left upper pole renal lesion measures 5.9 x 4.2 cm and is consistent with renal cell carcinoma. No renal vein invasion. Stomach/Bowel: The stomach, duodenum, small bowel and colon are grossly normal. No inflammatory changes, mass lesions or obstructive  findings. Vascular/Lymphatic: Bulky knee  upper abdominal lymphadenopathy with a confluent mass in the portal region, gastrohepatic ligament and celiac axis. This measures 10.2 x 6.5 cm on image number 55. There is compression of the extrahepatic portal vein. There is also bulky retroperitoneal lymphadenopathy bilaterally. Advanced atherosclerotic calcifications involving the aorta and branch vessels. No aneurysm or dissection. Reproductive: The uterus and ovaries are unremarkable. Other: No pelvic mass or adenopathy. No free pelvic fluid collections. Moderate distention of the bladder. No inguinal mass or adenopathy. Musculoskeletal: No definite lytic or sclerotic bone lesions to suggest metastasis. IMPRESSION: 1. Bulky mediastinal and right hilar lymphadenopathy. Imminent SVC occlusion. 2. Large right upper lobe lung mass and surrounding interstitial thickening suggesting interstitial spread of tumor. 3. Diffuse hepatic metastatic disease and bulky upper abdominal and retroperitoneal lymphadenopathy. 4. Complex enhancing partially calcified left upper pole renal mass most likely renal cell carcinoma. 5. Suspect sternal metastatic disease. I do not see any other obvious bone lesions. 6. Advanced atherosclerotic calcifications involving the thoracic and abdominal aorta and branch vessels. Electronically Signed   By: Marijo Sanes M.D.   On: 10/03/2016 16:26   Mr Lumbar Spine Wo Contrast  Result Date: 10/04/2016 CLINICAL DATA:  Acute on chronic low back pain with a recent fall. Recently diagnosed metastatic disease. L4 compression fracture. EXAM: MRI LUMBAR SPINE WITHOUT CONTRAST TECHNIQUE: Multiplanar, multisequence MR imaging of the lumbar spine was performed. No intravenous contrast was administered. COMPARISON:  CT abdomen and pelvis 10/03/2016 FINDINGS: Segmentation:  Standard. Alignment:  Slight anterolisthesis of L4 on L5 due to facet disease. Vertebrae: L4 superior endplate fracture with 38% height loss  centrally. No substantial marrow edema is identified directly subjacent to the compressed portion of the endplate. There is some hyperintense STIR signal more posteriorly along the superior endplate and in the right aspect of the vertebral body, however this is similar in appearance to multiple other levels and is felt to reflect an underlying osseous lesion rather than edema from an acute fracture. Bone marrow signal is diffusely abnormal throughout the visualized lower thoracic and lumbar spine as well as sacrum with replacement of the majority of normal fatty marrow signal and numerous STIR hyperintense lesions throughout. No gross paraspinal or epidural tumor is identified on this unenhanced study. Conus medullaris: Extends to the L1 level and appears normal. Paraspinal and other soft tissues: Liver metastases and retroperitoneal lymphadenopathy as described on recent CT. Disc levels: T12-L1:  Minimal disc bulging without stenosis. L1-2:  Negative. L2-3:  Negative. L3-4: Disc bulging, shallow central disc protrusion, and ligamentum flavum thickening result in mild right greater than left lateral recess stenosis, mild to moderate spinal stenosis, and no significant neural foraminal stenosis. L4-5: Disc bulging, shallow left paracentral disc protrusion, ligamentum flavum thickening, and facet arthrosis result in moderate spinal stenosis, mild right and moderate left lateral recess stenosis, and mild left neural foraminal stenosis. There are small facet joint effusions, right larger than left. L5-S1: Right paracentral disc protrusion results in minimal right lateral recess narrowing and slight posterior deflection of the adjacent right S1 nerve root. No spinal or neural foraminal stenosis. Mild facet arthrosis. IMPRESSION: 1. Diffuse osseous metastatic disease. 2. L4 superior endplate fracture with mild height loss favored to be chronic. 3. Moderate spinal stenosis and left greater than right lateral recess  stenosis at L4-5. 4. Mild-to-moderate spinal stenosis at L3-4. Electronically Signed   By: Logan Bores M.D.   On: 10/04/2016 12:04   Ct Abdomen Pelvis W Contrast  Result Date: 10/03/2016 CLINICAL DATA:  Metastatic breast  cancer EXAM: CT CHEST, ABDOMEN, AND PELVIS WITH CONTRAST TECHNIQUE: Multidetector CT imaging of the chest, abdomen and pelvis was performed following the standard protocol during bolus administration of intravenous contrast. CONTRAST:  165m ISOVUE-300 IOPAMIDOL (ISOVUE-300) INJECTION 61% COMPARISON:  CT scan 10/01/2016 FINDINGS: CT CHEST FINDINGS Chest wall: Status post left mastectomy. No right-sided breast masses are identified. No axillary lymphadenopathy. 8 mm supraclavicular lymph node noted on image 2. There also enlarge subclavicular lymph nodes on the right side. The thyroid gland is grossly normal. Cardiovascular: The heart is within normal limits in size and stable. All stable dense atherosclerotic calcifications involving the aorta and branch vessels. No aneurysm or dissection. Three-vessel coronary artery calcifications are noted. Mediastinum/Nodes: There is extensive mediastinal and right hilar tumor. Large nodal masses throughout the mediastinum. The right paratracheal mass measures 4.8 x 3.9 cm on image 17. There is severe compression of the SVC but it is not completely occluded. Right hilar/ mediastinal mass on image number 25 measures 7.8 x 6.7 cm. Subcarinal nodal mass on image number 28 measures 34 x 30 mm. Lungs/Pleura: Lobulated right upper lobe lung mass on image number 65 measures 38 x 32 mm. Marked interstitial thickening on the right could suggest interstitial spread of tumor. The left lung is relatively clear. No acute pulmonary findings or worrisome pulmonary lesions. There are small bilateral pleural effusions. Musculoskeletal: Mixed lytic and sclerotic sternal lesions are suspicious for metastatic disease. No other obvious bone lesions. CT ABDOMEN PELVIS FINDINGS  Hepatobiliary: Index lesion in the right lobe on image number 53 measures 8.6 x 4.7 cm. Index lesion in the lateral segment of the left hepatic lobe on image number 52 measures 5.4 x 4.1 cm. The gallbladder is grossly normal. A stable calcified gallstone is noted. Small amount of pericholecystic fluid. Pancreas: No mass, inflammation or ductal dilatation. Spleen: Normal size.  No focal lesions. Adrenals/Urinary Tract: Left adrenal gland lesions suspicious for metastasis. The right adrenal gland is normal. Complex enhancing left upper pole renal lesion measures 5.9 x 4.2 cm and is consistent with renal cell carcinoma. No renal vein invasion. Stomach/Bowel: The stomach, duodenum, small bowel and colon are grossly normal. No inflammatory changes, mass lesions or obstructive findings. Vascular/Lymphatic: Bulky knee upper abdominal lymphadenopathy with a confluent mass in the portal region, gastrohepatic ligament and celiac axis. This measures 10.2 x 6.5 cm on image number 55. There is compression of the extrahepatic portal vein. There is also bulky retroperitoneal lymphadenopathy bilaterally. Advanced atherosclerotic calcifications involving the aorta and branch vessels. No aneurysm or dissection. Reproductive: The uterus and ovaries are unremarkable. Other: No pelvic mass or adenopathy. No free pelvic fluid collections. Moderate distention of the bladder. No inguinal mass or adenopathy. Musculoskeletal: No definite lytic or sclerotic bone lesions to suggest metastasis. IMPRESSION: 1. Bulky mediastinal and right hilar lymphadenopathy. Imminent SVC occlusion. 2. Large right upper lobe lung mass and surrounding interstitial thickening suggesting interstitial spread of tumor. 3. Diffuse hepatic metastatic disease and bulky upper abdominal and retroperitoneal lymphadenopathy. 4. Complex enhancing partially calcified left upper pole renal mass most likely renal cell carcinoma. 5. Suspect sternal metastatic disease. I do not  see any other obvious bone lesions. 6. Advanced atherosclerotic calcifications involving the thoracic and abdominal aorta and branch vessels. Electronically Signed   By: PMarijo SanesM.D.   On: 10/03/2016 16:26   [place]  Result Date: 10/05/2016 INDICATION: 61year old female with evidence of metastatic disease in the chest, abdomen and bones. Tissue diagnosis is needed. EXAM: ULTRASOUND-GUIDED LIVER LESION  BIOPSY MEDICATIONS: None. ANESTHESIA/SEDATION: No sedation was used. The patient's vital signs were monitored continuously by radiology nursing throughout the procedure under my direct supervision. FLUOROSCOPY TIME:  None COMPLICATIONS: None immediate. PROCEDURE: Informed written consent was obtained from the patient after a thorough discussion of the procedural risks, benefits and alternatives. All questions were addressed. A timeout was performed prior to the initiation of the procedure. Liver was evaluated with ultrasound. There are subtle lesions scattered throughout the liver. A lesion in the right hepatic lobe was selected for biopsy. Abdomen was prepped and draped in sterile fashion. Skin and soft tissues were anesthetized with 1% lidocaine. Using ultrasound guidance, 17 gauge needle was directed into the right hepatic lobe lesion. A total of 4 core biopsies were obtained with an 18 gauge core device. Three of the core samples were adequate and placed in formalin. 17 gauge needle was removed without complication. Bandage placed over the puncture site. FINDINGS: Subtle lesions throughout the liver. Needle position confirmed within a right hepatic lesion. No significant bleeding or hematoma formation following the core biopsies. IMPRESSION: Successful ultrasound-guided core biopsy of a right hepatic lesion. Electronically Signed   By: Markus Daft M.D.   On: 10/05/2016 17:27   Ct Renal Stone Study  Result Date: 10/01/2016 CLINICAL DATA:  Flank pain and hematuria. EXAM: CT ABDOMEN AND PELVIS WITHOUT  CONTRAST TECHNIQUE: Multidetector CT imaging of the abdomen and pelvis was performed following the standard protocol without IV contrast. COMPARISON:  None. FINDINGS: Lower chest:  No contributory findings. Hepatobiliary: The liver is nearly replaced by innumerable masses.Cholelithiasis. No evidence of acute cholecystitis. Pancreas: The head and neck is indistinguishable from bulky retroperitoneal adenopathy. Spleen: Unremarkable. Adrenals/Urinary Tract: Negative adrenals. 4 cm mass exophytic from the upper pole left kidney. Small renal calculi may be present. No hydronephrosis or ureteral calculus (ovarian vein phlebolith noted on the right). 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 Asymmetric bladder wall thickening to the left. Stomach/Bowel:  No obstruction. No inflammatory changes. Vascular/Lymphatic: Bulky adenopathy in the upper abdomen with confluent nodal mass extending from the gastrohepatic ligament to the hepatic hilum and measuring up to 10 cm. Retroperitoneal adenopathy continues inferiorly to the level of the aortic bifurcation. Atherosclerotic calcification Reproductive:No pathologic findings. Other: No ascites or pneumoperitoneum. Musculoskeletal: Heterogeneous density of marrow without discrete destructive lesion. There is a L4 superior endplate with chronic appearance based on overlying vacuum phenomenon. Height loss is mild. Bone metastases may be present. IMPRESSION: 1. Extensive metastatic disease in the liver and retroperitoneal lymph nodes. Patient has history of breast cancer and there is a 4 cm left renal mass compatible with renal cell carcinoma. 2. Asymmetric left bladder wall thickening. 3. L4 superior endplate fracture, with nonacute appearance. This may be pathologic. Electronically Signed   By: Monte Fantasia M.D.   On: 10/01/2016 17:57    ASSESSMENT & PLAN:  # 61 year old female patient currently admitted to the hospital for generalized weakness/UTI. 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 Discussed that median survival is in the order of 6-12 months. However given patient's multiple comorbidities/acute illness including A. fib with RVR- the life  expectancy would be much lesser. Patient's acute illness  [hypotension A. fib with RVR]; acute renal failure in the context of multiple comorbidities- likely to preclude chemotherapy at this time. Radiation to the SVC- is purely palliative; and will not treat rest of the systemic disease.   #  Discussed DNR/DNI at length. Discussed with Dr. Brigitte Pulse. Discussed with palliative care, Megan. Very poor prognosis.   # Multiple comorbidities including- history of stroke/TIA; atrial flutter on Eliquis; CHF; history of peripheral vascular disease.    Cammie Sickle, MD 10/07/2016 3:21 PM

## 2016-10-07 NOTE — Progress Notes (Signed)
[facility] [place] NAME: Megan Cisneros    MR#:  [id number]  DATE OF BIRTH:  28-Dec-1955  SUBJECTIVE:  CHIEF COMPLAINT:   Chief Complaint  Patient presents with  . Weakness  Remains lethargic. BP low. REVIEW OF SYSTEMS:  CONSTITUTIONAL: No fever,positive for fatigue or weakness.  EYES: No blurred or double vision.  EARS, NOSE, AND THROAT: No tinnitus or ear pain.  RESPIRATORY: No cough, shortness of breath, wheezing or hemoptysis.  CARDIOVASCULAR: No chest pain, orthopnea, edema.  GASTROINTESTINAL: No nausea, vomiting, diarrhea or abdominal pain.  GENITOURINARY: No dysuria, hematuria.  ENDOCRINE: No polyuria, nocturia,  HEMATOLOGY: No anemia, easy bruising or bleeding SKIN: No rash or lesion. MUSCULOSKELETAL: No joint pain or arthritis.   NEUROLOGIC: No tingling, numbness, weakness.  PSYCHIATRY: No anxiety or depression.   ROS  DRUG ALLERGIES:  No Known Allergies  VITALS:  Blood pressure (!) 103/52, pulse (!) 108, temperature 98.6 F (37 C), temperature source Oral, resp. rate (!) 24, height 5' (1.524 m), weight 78.9 kg (173 lb 15.1 oz), SpO2 95 %.  PHYSICAL EXAMINATION:  GENERAL:  61 y.o.-year-old patient lying in the bed with no acute distress.  EYES: Pupils equal, round, reactive to light and accommodation. No scleral icterus. Extraocular muscles intact.  HEENT: Head atraumatic, normocephalic. Oropharynx and nasopharynx clear. Oral mucosa dry. NECK:  Supple, no jugular venous distention. No thyroid enlargement, no tenderness.  LUNGS: Normal breath sounds bilaterally, no wheezing, rales,rhonchi or crepitation. No use of accessory muscles of respiration.  CARDIOVASCULAR: S1, S2 fast and regular. No murmurs, rubs, or gallops.  ABDOMEN: Soft, nontender, nondistended. Bowel sounds present. No organomegaly or mass.  EXTREMITIES: No pedal edema, cyanosis, or clubbing.  NEUROLOGIC: Cranial nerves II through XII are intact. Muscle  strength 4/5 in all extremities. Sensation intact. Gait not checked.  PSYCHIATRIC: The patient is Lethargic.  SKIN: No obvious rash, lesion, or ulcer.   Physical Exam LABORATORY PANEL:   CBC  Recent Labs Lab 10/06/16 1126  WBC 9.4  HGB 10.4*  HCT 30.7*  PLT 226   ------------------------------------------------------------------------------------------------------------------  Chemistries   Recent Labs Lab 10/06/16 1126 10/07/16 0516  NA 138 140  K 4.1 4.1  CL 100* 104  CO2 27 24  GLUCOSE 165* 79  BUN 43* 46*  CREATININE 1.64* 1.81*  CALCIUM 7.6* 7.8*  MG 1.4*  --    ------------------------------------------------------------------------------------------------------------------  Cardiac Enzymes  Recent Labs Lab 10/01/16 1408  TROPONINI 0.04*   ------------------------------------------------------------------------------------------------------------------  RADIOLOGY:  Dg Chest 2 View  Result Date: 10/06/2016 CLINICAL DATA:  Hypoxia.  History of breast carcinoma EXAM: CHEST  2 VIEW COMPARISON:  Chest radiograph October 01, 2016 and chest CT October 03, 2016 FINDINGS: There is opacification of most of the right with combination of consolidation, extensive adenopathy, and pleural effusion. There has been progression of consolidation effusion on the right compared to recent studies. Left lung is clear. Heart size is normal. The left hilum and mediastinal region appears unremarkable. There is atherosclerotic calcification in the aorta. No obvious bony lesions are evident by radiography. The areas of concern for neoplastic focus in the sternum not appreciable by radiography. IMPRESSION: Extensive opacification throughout most of the right lung with progression from recent studies. The appearance is consistent with a combination of pleural effusion, extensive adenopathy comment consolidation. CT shows widespread neoplastic change throughout the right lung. Left lung  appears clear. Stable cardiac silhouette. Aortic atherosclerosis. Electronically Signed   By: Lowella Grip III M.D.  On: 10/06/2016 10:22    ASSESSMENT AND PLAN:  61 year old female patient currently admitted to the hospital for generalized weakness/UTI. Imaging shows concerns for metastatic - biopsy positive for small cell cancer  #1. small cell lung cancer- with mets. Appreciate onco input. Any treatment from cancer perspective would be just palliative only. likely to preclude chemotherapy at this time. Radiation to the SVC- is purely palliative; and will not treat rest of the systemic disease.  - DNR/DNI per palliative care, Megan. Very poor prognosis. Daughter seem to be in denial   #2 Staph Aureus UTI: oral keflex - total 5 days.  #3 Afib/flutter with RVR/hypotension - stop cardizem and start amio drip per cardio - on Eliquis for anitcoagulation  #4.L4 superior endplate fracture, with nonacute appearance.    As per MRI mets in vertebra  Ortho input appreciated.   As per Dr.Menz, pt does not need kyphoplasty this time, unless need bone biopsy.   #5 Ch diastolic CHF - well compensated at this time   #6 Diabetes   Was on metformin, insulin sliding scale coverage.   Stop metformin as kidney func is getting worse, likely due to hypotension.  #7 Hypotension - monitor while on amiodarone drip  #8 ARF - c/s Nephro   PT suggest SNF.    All the records are reviewed and case discussed with Care Management/Social Workerr. Management plans discussed with the patient, family and they are in agreement.  CODE STATUS: DNR, very poor prognosis  TOTAL TIME TAKING CARE OF THIS PATIENT: 45 critical care minutes.   POSSIBLE D/C IN 2-3 DAYS, DEPENDING ON CLINICAL CONDITION.   Max Sane M.D on 10/07/2016   Between 7am to 6pm - Pager - [phone]  After 6pm go to [email or web address] - password [email or web address] [facility]  Office  [phone]  CC: Primary care  physician; Donnie Coffin, MD  Note: This dictation was prepared with Dragon dictation along with smaller phrase technology. Any transcriptional errors that result from this process are unintentional.

## 2016-10-08 DIAGNOSIS — K5900 Constipation, unspecified: Secondary | ICD-10-CM

## 2016-10-08 DIAGNOSIS — R109 Unspecified abdominal pain: Secondary | ICD-10-CM

## 2016-10-08 DIAGNOSIS — M545 Low back pain: Secondary | ICD-10-CM

## 2016-10-08 LAB — GLUCOSE, CAPILLARY
GLUCOSE-CAPILLARY: 215 mg/dL — AB (ref 65–99)
GLUCOSE-CAPILLARY: 140 mg/dL — AB (ref 65–99)
Glucose-Capillary: 213 mg/dL — ABNORMAL HIGH (ref 65–99)
Glucose-Capillary: 157 mg/dL — ABNORMAL HIGH (ref 65–99)
Glucose-Capillary: 192 mg/dL — ABNORMAL HIGH (ref 65–99)
Glucose-Capillary: 99 mg/dL (ref 65–99)
Glucose-Capillary: 118 mg/dL — ABNORMAL HIGH (ref 65–99)

## 2016-10-08 LAB — MAGNESIUM: MAGNESIUM: 1.8 mg/dL (ref 1.7–2.4)

## 2016-10-08 MED ORDER — AMIODARONE HCL 200 MG PO TABS
400.0000 mg | ORAL_TABLET | Freq: Two times a day (BID) | ORAL | Status: DC
  Administered 2016-10-08 – 2016-10-09 (×3): 400 mg via ORAL
  Filled 2016-10-08 (×3): qty 2

## 2016-10-08 MED ORDER — SODIUM CHLORIDE 0.9 % IV BOLUS (SEPSIS)
500.0000 mL | Freq: Once | INTRAVENOUS | Status: AC
  Administered 2016-10-08: 500 mL via INTRAVENOUS

## 2016-10-08 NOTE — Progress Notes (Signed)
Pt refuses sacrum foam at this time. Megan Cisneros

## 2016-10-08 NOTE — Progress Notes (Signed)
[facility] CONSULT NOTE  Patient Care Team: Donnie Coffin, MD as PCP - General (Family Medicine) Minna Merritts, MD as Consulting Physician (Cardiology)   CC: The patient continues to have progressive shortness of breath difficulty sleeping on her back. She is currently on amiodarone drip for her atrial arrhythmia with RVR. Patient needing oxygen. Patient is unable to get out of the bed because of generalized weakness.  Complains of low back pain. Complains of abdominal discomfort. Complains of constipation. Denies any chest pain.  ROS: No headaches. A complete 10 point review of system is done which is negative except mentioned above in history of present illness  MEDICAL HISTORY:  Past Medical History:  Diagnosis Date  . Atrial flutter ([place])    a. Dx 11/2015 -->CHA2DS2VASc = 7-->Eliquis '5mg'$  BID.  Marland Kitchen Breast cancer ([place])   . Chronic diastolic CHF (congestive heart failure) ([place])    a. 11/2015 Echo: EF 60-65%, no rwma, mild MR, mildly dil LA, nl RV fxn.  . Chronic Dyspnea on Exertion    a. 2004 Stress test prior to LE bypass->reportedly nl.  . Diabetes mellitus without complication ([place])    a. Dx as borderline diabetic in the 80's-->progressed over the years.  . Essential hypertension   . Lower extremity edema   . Morbid obesity ([place])   . PVC's (premature ventricular contractions)   . PVD (peripheral vascular disease) ([place])    a. 2004 s/p RLE bypass - complicated by recurrent infection.  . Stroke [facility])    a. TIA x 2;  b. Stroke in 2007 in [place].    SURGICAL HISTORY: Past Surgical History:  Procedure Laterality Date  . Cataract Surgery Right    03/2014  . CESAREAN SECTION    . ELECTROPHYSIOLOGIC STUDY N/A 08/09/2016   Procedure: CARDIOVERSION;  Surgeon: Wellington Hampshire, MD;  Location: [facility] ORS;  Service: Cardiovascular;  Laterality: N/A;  . LEG SURGERY    . MASTECTOMY Left   . 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 Hx of smoking quit 20 years ago. Patient used to live in [place]. Currently lives in [place] with her daughter.   Social History   Social History  . Marital status: Widowed    Spouse name: N/A  . Number of children: N/A  . Years of education: N/A   Occupational History  . Not on file.   Social History Main Topics  . Smoking status: Former Research scientist (life sciences)  . Smokeless tobacco: Never Used  . Alcohol use No  . Drug use: No  . Sexual activity: Not on file   Other Topics Concern  . Not on file   Social History Narrative   Lives in [place] by herself.  Originally from [place], [place], later moved to [place] and then [place].  Has been in [place] for 8 yrs.  Family nearby.  Does not routinely exercise.   Has a cane and a walker    FAMILY HISTORY: Family History  Problem Relation Age of Onset  . Heart attack Father     died in his early 31's.  . Diabetes Father   . Cancer Mother     deceased  . Colon cancer Mother   . Cancer Brother     multiple cancers, deceased.  . Leukemia Brother   . Liver cancer Brother   . Pancreatic cancer Brother     ALLERGIES:  has No Known Allergies.  MEDICATIONS:  Current Facility-Administered Medications  Medication Dose Route Frequency Provider Last Rate Last Dose  . 0.9 %  sodium chloride infusion   Intravenous Continuous Arne Cleveland, MD      . 0.9 %  sodium chloride infusion   Intravenous Continuous Minna Merritts, MD 75 mL/hr at 10/08/16 1123    . acetaminophen (TYLENOL) tablet 650 mg  650 mg Oral Q6H PRN Epifanio Lesches, MD       Or  . acetaminophen (TYLENOL) suppository 650 mg  650 mg Rectal Q6H PRN Epifanio Lesches, MD      . albuterol (PROVENTIL) (2.5 MG/3ML) 0.083% nebulizer solution 2.5 mg  2.5 mg Inhalation Q4H PRN Epifanio Lesches, MD   2.5 mg at 10/07/16 1062  . amiodarone (PACERONE) tablet 400 mg  400 mg Oral BID Areta Haber Dunn, PA-C   400 mg at 10/08/16 1226  . apixaban (ELIQUIS) tablet 5 mg  5 mg Oral BID  Vaughan Basta, MD   5 mg at 10/08/16 0949  . atorvastatin (LIPITOR) tablet 80 mg  80 mg Oral q1800 Epifanio Lesches, MD   Stopped at 10/07/16 1800  . bisacodyl (DULCOLAX) EC tablet 5 mg  5 mg Oral Daily PRN Epifanio Lesches, MD   5 mg at 10/08/16 0846  . cephALEXin (KEFLEX) capsule 500 mg  500 mg Oral Q12H Vaughan Basta, MD   500 mg at 10/08/16 0950  . docusate sodium (COLACE) capsule 100 mg  100 mg Oral BID Epifanio Lesches, MD   100 mg at 10/08/16 0950  . feeding supplement (ENSURE ENLIVE) (ENSURE ENLIVE) liquid 237 mL  237 mL Oral TID WC & HS Vipul Shah, MD   237 mL at 10/08/16 1154  . gabapentin (NEURONTIN) capsule 100 mg  100 mg Oral BID Alexis Hugelmeyer, DO   100 mg at 10/08/16 0950  . HYDROcodone-acetaminophen (NORCO/VICODIN) 5-325 MG per tablet 1-2 tablet  1-2 tablet Oral Q4H PRN Epifanio Lesches, MD   2 tablet at 10/08/16 1116  . insulin aspart (novoLOG) injection 0-9 Units  0-9 Units Subcutaneous TID WC Vaughan Basta, MD   2 Units at 10/08/16 1154  . insulin detemir (LEVEMIR) injection 5 Units  5 Units Subcutaneous QHS Epifanio Lesches, MD   5 Units at 10/07/16 2117  . multivitamin with minerals tablet 1 tablet  1 tablet Oral Daily Epifanio Lesches, MD   1 tablet at 10/08/16 0950  . ondansetron (ZOFRAN) tablet 4 mg  4 mg Oral Q6H PRN Epifanio Lesches, MD       Or  . ondansetron (ZOFRAN) injection 4 mg  4 mg Intravenous Q6H PRN Epifanio Lesches, MD   4 mg at 10/02/16 1314  . oxybutynin (DITROPAN) tablet 5 mg  5 mg Oral BID Epifanio Lesches, MD   5 mg at 10/08/16 0949  . pantoprazole (PROTONIX) EC tablet 40 mg  40 mg Oral Daily Epifanio Lesches, MD   40 mg at 10/08/16 0951  . traZODone (DESYREL) tablet 100 mg  100 mg Oral BID Epifanio Lesches, MD   100 mg at 10/08/16 0950  . traZODone (DESYREL) tablet 25 mg  25 mg Oral QHS PRN Epifanio Lesches, MD          .  PHYSICAL EXAMINATION:  Vitals:   10/08/16 0758 10/08/16 1128   BP: (!) 121/44 (!) 137/54  Pulse: (!) 105 (!) 109  Resp: 19 19  Temp: 97.4 F (36.3 C) 97.7 F (36.5 C)   Filed Weights   10/06/16 2100 10/07/16 2156 10/08/16 0506  Weight: 173  lb 15.1 oz (78.9 kg) 179 lb 12.8 oz (81.6 kg) 182 lb 4.8 oz (82.7 kg)    GENERAL: Well-nourished well-developed; Sleepy; no distress and comfortable.   She is Accompanied by daughter. Home oxygen. EYES: no pallor or icterus OROPHARYNX: no thrush or ulceration. Poor dentition NECK: supple, no masses felt LYMPH:  no palpable lymphadenopathy in the cervical, axillary or inguinal regions LUNGS: decreased breath sounds to auscultation at bases; no wheeze or crackles. HEART/CVS: Tachycardic irregular rhythm no murmurs; No lower extremity edema ABDOMEN: abdomen soft, non-tender and normal bowel sounds Musculoskeletal:no cyanosis of digits and no clubbing  PSYCH: normal affect.  NEURO: Alert and oriented 3 no focal deficits.  SKIN:  no rashes or significant lesions  LABORATORY DATA:  I have reviewed the data as listed Lab Results  Component Value Date   WBC 9.4 10/06/2016   HGB 10.4 (L) 10/06/2016   HCT 30.7 (L) 10/06/2016   MCV 86.3 10/06/2016   PLT 226 10/06/2016    Recent Labs  10/05/16 0412 10/06/16 1126 10/07/16 0516  NA 140 138 140  K 3.8 4.1 4.1  CL 96* 100* 104  CO2 '31 27 24  '$ GLUCOSE 98 165* 79  BUN 33* 43* 46*  CREATININE 1.11* 1.64* 1.81*  CALCIUM 8.2* 7.6* 7.8*  GFRNONAA 52* 33* 29*  GFRAA >60 38* 33*    RADIOGRAPHIC STUDIES: I have personally reviewed the radiological images as listed and agreed with the findings in the report. Dg Chest 1 View  Result Date: 10/01/2016 CLINICAL DATA:  Acute onset of generalized weakness. Initial encounter. EXAM: CHEST 1 VIEW COMPARISON:  Chest radiographs performed 08/05/2016 and 12/11/2015 FINDINGS: Enlarging dense right hilar opacity is suspicious for malignancy. The left lung appears relatively clear. No pleural effusion or pneumothorax is seen.  There is elevation of the right hemidiaphragm. The cardiomediastinal silhouette is normal in size. No acute osseous abnormalities are identified. IMPRESSION: Enlarging dense right hilar opacity is suspicious for malignancy. CT of the chest with contrast is recommended for further evaluation. Electronically Signed   By: Garald Balding M.D.   On: 10/01/2016 17:35   Dg Chest 2 View  Result Date: 10/06/2016 CLINICAL DATA:  Hypoxia.  History of breast carcinoma EXAM: CHEST  2 VIEW COMPARISON:  Chest radiograph October 01, 2016 and chest CT October 03, 2016 FINDINGS: There is opacification of most of the right with combination of consolidation, extensive adenopathy, and pleural effusion. There has been progression of consolidation effusion on the right compared to recent studies. Left lung is clear. Heart size is normal. The left hilum and mediastinal region appears unremarkable. There is atherosclerotic calcification in the aorta. No obvious bony lesions are evident by radiography. The areas of concern for neoplastic focus in the sternum not appreciable by radiography. IMPRESSION: Extensive opacification throughout most of the right lung with progression from recent studies. The appearance is consistent with a combination of pleural effusion, extensive adenopathy comment consolidation. CT shows widespread neoplastic change throughout the right lung. Left lung appears clear. Stable cardiac silhouette. Aortic atherosclerosis. Electronically Signed   By: Lowella Grip III M.D.   On: 10/06/2016 10:22   Dg Lumbar Spine 2-3 Views  Result Date: 10/02/2016 CLINICAL DATA:  Back pain and weakness for 2 days. EXAM: LUMBAR SPINE - 2-3 VIEW COMPARISON:  CT scan October 01, 2016 FINDINGS: Scalloping of the L4 superior endplate was better assessed on yesterday's CT scan and is unchanged. Degenerative changes in the lower thoracic spine. No other interval changes or bony  abnormalities. IMPRESSION: No change since yesterday's  CT scan. Continued scalloping of the L4 superior endplate. Electronically Signed   By: Dorise Bullion III M.D   On: 10/02/2016 17:33   Ct Head Wo Contrast  Result Date: 10/01/2016 CLINICAL DATA:  Weakness for 2 weeks EXAM: CT HEAD WITHOUT CONTRAST TECHNIQUE: Contiguous axial images were obtained from the base of the skull through the vertex without intravenous contrast. COMPARISON:  None. FINDINGS: Brain: No evidence of acute infarction, hemorrhage, extra-axial collection, ventriculomegaly, or mass effect. Generalized cerebral atrophy. Periventricular white matter low attenuation likely secondary to microangiopathy. Vascular: Cerebrovascular atherosclerotic calcifications are noted. Skull: Negative for fracture or focal lesion. Sinuses/Orbits: Visualized portions of the orbits are unremarkable. Visualized portions of the paranasal sinuses and mastoid air cells are unremarkable. Other: None. IMPRESSION: 1. No acute intracranial pathology. 2. Chronic microvascular disease and cerebral atrophy. Electronically Signed   By: Kathreen Devoid   On: 10/01/2016 17:24   Ct Chest W Contrast  Result Date: 10/03/2016 CLINICAL DATA:  Metastatic breast cancer EXAM: CT CHEST, ABDOMEN, AND PELVIS WITH CONTRAST TECHNIQUE: Multidetector CT imaging of the chest, abdomen and pelvis was performed following the standard protocol during bolus administration of intravenous contrast. CONTRAST:  186m ISOVUE-300 IOPAMIDOL (ISOVUE-300) INJECTION 61% COMPARISON:  CT scan 10/01/2016 FINDINGS: CT CHEST FINDINGS Chest wall: Status post left mastectomy. No right-sided breast masses are identified. No axillary lymphadenopathy. 8 mm supraclavicular lymph node noted on image 2. There also enlarge subclavicular lymph nodes on the right side. The thyroid gland is grossly normal. Cardiovascular: The heart is within normal limits in size and stable. All stable dense atherosclerotic calcifications involving the aorta and branch vessels. No aneurysm or  dissection. Three-vessel coronary artery calcifications are noted. Mediastinum/Nodes: There is extensive mediastinal and right hilar tumor. Large nodal masses throughout the mediastinum. The right paratracheal mass measures 4.8 x 3.9 cm on image 17. There is severe compression of the SVC but it is not completely occluded. Right hilar/ mediastinal mass on image number 25 measures 7.8 x 6.7 cm. Subcarinal nodal mass on image number 28 measures 34 x 30 mm. Lungs/Pleura: Lobulated right upper lobe lung mass on image number 65 measures 38 x 32 mm. Marked interstitial thickening on the right could suggest interstitial spread of tumor. The left lung is relatively clear. No acute pulmonary findings or worrisome pulmonary lesions. There are small bilateral pleural effusions. Musculoskeletal: Mixed lytic and sclerotic sternal lesions are suspicious for metastatic disease. No other obvious bone lesions. CT ABDOMEN PELVIS FINDINGS Hepatobiliary: Index lesion in the right lobe on image number 53 measures 8.6 x 4.7 cm. Index lesion in the lateral segment of the left hepatic lobe on image number 52 measures 5.4 x 4.1 cm. The gallbladder is grossly normal. A stable calcified gallstone is noted. Small amount of pericholecystic fluid. Pancreas: No mass, inflammation or ductal dilatation. Spleen: Normal size.  No focal lesions. Adrenals/Urinary Tract: Left adrenal gland lesions suspicious for metastasis. The right adrenal gland is normal. Complex enhancing left upper pole renal lesion measures 5.9 x 4.2 cm and is consistent with renal cell carcinoma. No renal vein invasion. Stomach/Bowel: The stomach, duodenum, small bowel and colon are grossly normal. No inflammatory changes, mass lesions or obstructive findings. Vascular/Lymphatic: Bulky knee upper abdominal lymphadenopathy with a confluent mass in the portal region, gastrohepatic ligament and celiac axis. This measures 10.2 x 6.5 cm on image number 55. There is compression of the  extrahepatic portal vein. There is also bulky retroperitoneal lymphadenopathy bilaterally. Advanced  atherosclerotic calcifications involving the aorta and branch vessels. No aneurysm or dissection. Reproductive: The uterus and ovaries are unremarkable. Other: No pelvic mass or adenopathy. No free pelvic fluid collections. Moderate distention of the bladder. No inguinal mass or adenopathy. Musculoskeletal: No definite lytic or sclerotic bone lesions to suggest metastasis. IMPRESSION: 1. Bulky mediastinal and right hilar lymphadenopathy. Imminent SVC occlusion. 2. Large right upper lobe lung mass and surrounding interstitial thickening suggesting interstitial spread of tumor. 3. Diffuse hepatic metastatic disease and bulky upper abdominal and retroperitoneal lymphadenopathy. 4. Complex enhancing partially calcified left upper pole renal mass most likely renal cell carcinoma. 5. Suspect sternal metastatic disease. I do not see any other obvious bone lesions. 6. Advanced atherosclerotic calcifications involving the thoracic and abdominal aorta and branch vessels. Electronically Signed   By: Marijo Sanes M.D.   On: 10/03/2016 16:26   Mr Lumbar Spine Wo Contrast  Result Date: 10/04/2016 CLINICAL DATA:  Acute on chronic low back pain with a recent fall. Recently diagnosed metastatic disease. L4 compression fracture. EXAM: MRI LUMBAR SPINE WITHOUT CONTRAST TECHNIQUE: Multiplanar, multisequence MR imaging of the lumbar spine was performed. No intravenous contrast was administered. COMPARISON:  CT abdomen and pelvis 10/03/2016 FINDINGS: Segmentation:  Standard. Alignment:  Slight anterolisthesis of L4 on L5 due to facet disease. Vertebrae: L4 superior endplate fracture with 41% height loss centrally. No substantial marrow edema is identified directly subjacent to the compressed portion of the endplate. There is some hyperintense STIR signal more posteriorly along the superior endplate and in the right aspect of the  vertebral body, however this is similar in appearance to multiple other levels and is felt to reflect an underlying osseous lesion rather than edema from an acute fracture. Bone marrow signal is diffusely abnormal throughout the visualized lower thoracic and lumbar spine as well as sacrum with replacement of the majority of normal fatty marrow signal and numerous STIR hyperintense lesions throughout. No gross paraspinal or epidural tumor is identified on this unenhanced study. Conus medullaris: Extends to the L1 level and appears normal. Paraspinal and other soft tissues: Liver metastases and retroperitoneal lymphadenopathy as described on recent CT. Disc levels: T12-L1:  Minimal disc bulging without stenosis. L1-2:  Negative. L2-3:  Negative. L3-4: Disc bulging, shallow central disc protrusion, and ligamentum flavum thickening result in mild right greater than left lateral recess stenosis, mild to moderate spinal stenosis, and no significant neural foraminal stenosis. L4-5: Disc bulging, shallow left paracentral disc protrusion, ligamentum flavum thickening, and facet arthrosis result in moderate spinal stenosis, mild right and moderate left lateral recess stenosis, and mild left neural foraminal stenosis. There are small facet joint effusions, right larger than left. L5-S1: Right paracentral disc protrusion results in minimal right lateral recess narrowing and slight posterior deflection of the adjacent right S1 nerve root. No spinal or neural foraminal stenosis. Mild facet arthrosis. IMPRESSION: 1. Diffuse osseous metastatic disease. 2. L4 superior endplate fracture with mild height loss favored to be chronic. 3. Moderate spinal stenosis and left greater than right lateral recess stenosis at L4-5. 4. Mild-to-moderate spinal stenosis at L3-4. Electronically Signed   By: Logan Bores M.D.   On: 10/04/2016 12:04   Ct Abdomen Pelvis W Contrast  Result Date: 10/03/2016 CLINICAL DATA:  Metastatic breast cancer EXAM:  CT CHEST, ABDOMEN, AND PELVIS WITH CONTRAST TECHNIQUE: Multidetector CT imaging of the chest, abdomen and pelvis was performed following the standard protocol during bolus administration of intravenous contrast. CONTRAST:  153m ISOVUE-300 IOPAMIDOL (ISOVUE-300) INJECTION 61% COMPARISON:  CT  scan 10/01/2016 FINDINGS: CT CHEST FINDINGS Chest wall: Status post left mastectomy. No right-sided breast masses are identified. No axillary lymphadenopathy. 8 mm supraclavicular lymph node noted on image 2. There also enlarge subclavicular lymph nodes on the right side. The thyroid gland is grossly normal. Cardiovascular: The heart is within normal limits in size and stable. All stable dense atherosclerotic calcifications involving the aorta and branch vessels. No aneurysm or dissection. Three-vessel coronary artery calcifications are noted. Mediastinum/Nodes: There is extensive mediastinal and right hilar tumor. Large nodal masses throughout the mediastinum. The right paratracheal mass measures 4.8 x 3.9 cm on image 17. There is severe compression of the SVC but it is not completely occluded. Right hilar/ mediastinal mass on image number 25 measures 7.8 x 6.7 cm. Subcarinal nodal mass on image number 28 measures 34 x 30 mm. Lungs/Pleura: Lobulated right upper lobe lung mass on image number 65 measures 38 x 32 mm. Marked interstitial thickening on the right could suggest interstitial spread of tumor. The left lung is relatively clear. No acute pulmonary findings or worrisome pulmonary lesions. There are small bilateral pleural effusions. Musculoskeletal: Mixed lytic and sclerotic sternal lesions are suspicious for metastatic disease. No other obvious bone lesions. CT ABDOMEN PELVIS FINDINGS Hepatobiliary: Index lesion in the right lobe on image number 53 measures 8.6 x 4.7 cm. Index lesion in the lateral segment of the left hepatic lobe on image number 52 measures 5.4 x 4.1 cm. The gallbladder is grossly normal. A stable  calcified gallstone is noted. Small amount of pericholecystic fluid. Pancreas: No mass, inflammation or ductal dilatation. Spleen: Normal size.  No focal lesions. Adrenals/Urinary Tract: Left adrenal gland lesions suspicious for metastasis. The right adrenal gland is normal. Complex enhancing left upper pole renal lesion measures 5.9 x 4.2 cm and is consistent with renal cell carcinoma. No renal vein invasion. Stomach/Bowel: The stomach, duodenum, small bowel and colon are grossly normal. No inflammatory changes, mass lesions or obstructive findings. Vascular/Lymphatic: Bulky knee upper abdominal lymphadenopathy with a confluent mass in the portal region, gastrohepatic ligament and celiac axis. This measures 10.2 x 6.5 cm on image number 55. There is compression of the extrahepatic portal vein. There is also bulky retroperitoneal lymphadenopathy bilaterally. Advanced atherosclerotic calcifications involving the aorta and branch vessels. No aneurysm or dissection. Reproductive: The uterus and ovaries are unremarkable. Other: No pelvic mass or adenopathy. No free pelvic fluid collections. Moderate distention of the bladder. No inguinal mass or adenopathy. Musculoskeletal: No definite lytic or sclerotic bone lesions to suggest metastasis. IMPRESSION: 1. Bulky mediastinal and right hilar lymphadenopathy. Imminent SVC occlusion. 2. Large right upper lobe lung mass and surrounding interstitial thickening suggesting interstitial spread of tumor. 3. Diffuse hepatic metastatic disease and bulky upper abdominal and retroperitoneal lymphadenopathy. 4. Complex enhancing partially calcified left upper pole renal mass most likely renal cell carcinoma. 5. Suspect sternal metastatic disease. I do not see any other obvious bone lesions. 6. Advanced atherosclerotic calcifications involving the thoracic and abdominal aorta and branch vessels. Electronically Signed   By: Marijo Sanes M.D.   On: 10/03/2016 16:26   US Biopsy  Result  Date: 10/05/2016 INDICATION: 61 year old female with evidence of metastatic disease in the chest, abdomen and bones. Tissue diagnosis is needed. EXAM: ULTRASOUND-GUIDED LIVER LESION BIOPSY MEDICATIONS: None. ANESTHESIA/SEDATION: No sedation was used. The patient's vital signs were monitored continuously by radiology nursing throughout the procedure under my direct supervision. FLUOROSCOPY TIME:  None COMPLICATIONS: None immediate. PROCEDURE: Informed written consent was obtained from the patient after  a thorough discussion of the procedural risks, benefits and alternatives. All questions were addressed. A timeout was performed prior to the initiation of the procedure. Liver was evaluated with ultrasound. There are subtle lesions scattered throughout the liver. A lesion in the right hepatic lobe was selected for biopsy. Abdomen was prepped and draped in sterile fashion. Skin and soft tissues were anesthetized with 1% lidocaine. Using ultrasound guidance, 17 gauge needle was directed into the right hepatic lobe lesion. A total of 4 core biopsies were obtained with an 18 gauge core device. Three of the core samples were adequate and placed in formalin. 17 gauge needle was removed without complication. Bandage placed over the puncture site. FINDINGS: Subtle lesions throughout the liver. Needle position confirmed within a right hepatic lesion. No significant bleeding or hematoma formation following the core biopsies. IMPRESSION: Successful ultrasound-guided core biopsy of a right hepatic lesion. Electronically Signed   By: Markus Daft M.D.   On: 10/05/2016 17:27   Ct Renal Stone Study  Result Date: 10/01/2016 CLINICAL DATA:  Flank pain and hematuria. EXAM: CT ABDOMEN AND PELVIS WITHOUT CONTRAST TECHNIQUE: Multidetector CT imaging of the abdomen and pelvis was performed following the standard protocol without IV contrast. COMPARISON:  None. FINDINGS: Lower chest:  No contributory findings. Hepatobiliary: The liver is  nearly replaced by innumerable masses.Cholelithiasis. No evidence of acute cholecystitis. Pancreas: The head and neck is indistinguishable from bulky retroperitoneal adenopathy. Spleen: Unremarkable. Adrenals/Urinary Tract: Negative adrenals. 4 cm mass exophytic from the upper pole left kidney. Small renal calculi may be present. No hydronephrosis or ureteral calculus (ovarian vein phlebolith noted on the right). 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 Asymmetric bladder wall thickening to the left. Stomach/Bowel:  No obstruction. No inflammatory changes. Vascular/Lymphatic: Bulky adenopathy in the upper abdomen with confluent nodal mass extending from the gastrohepatic ligament to the hepatic hilum and measuring up to 10 cm. Retroperitoneal adenopathy continues inferiorly to the level of the aortic bifurcation. Atherosclerotic calcification Reproductive:No pathologic findings. Other: No ascites or pneumoperitoneum. Musculoskeletal: Heterogeneous density of marrow without discrete destructive lesion. There is a L4 superior endplate with chronic appearance based on overlying vacuum phenomenon. Height loss is mild. Bone metastases may be present. IMPRESSION: 1. Extensive metastatic disease in the liver and retroperitoneal lymph nodes. Patient has history of breast cancer and there is a 4 cm left renal mass compatible with renal cell carcinoma. 2. Asymmetric left bladder wall thickening. 3. L4 superior endplate fracture, with nonacute appearance. This may be pathologic. Electronically Signed   By: Monte Fantasia M.D.   On: 10/01/2016 17:57    ASSESSMENT & PLAN:  # 61 year old female patient currently admitted to the hospital for generalized weakness/UTI. Imaging shows concerns for metastatic - biopsy positive for small cell cancer.  # Metastatic/extensive stage small cell lung cancer- with multiple liver metastases and retroperitoneal adenopathy and also significant hilar/mediastinal adenopathy. Had a long discussion  the patient and her daughter in presence of palliative care nurse again- that given her acute illness-A. fib with RVR respiratory failure/renal failure in the context of her chronic comorbidities- would make her extremely high risk for death from chemotherapy. Radiation- is again palliative; and the patient is not able to lay back [as currently is]- we will not be able to tolerate radiation either. Unfortunately I do not expect patient's respiratory status to improve significantly over the next few days.   # Patient DNR/DNI. Discussed with Dr. Manuella Ghazi. Discussed with palliative care, Megan. Very poor prognosis. Given the significant decline in the  last few days - hospice home discharge might be reasonable. Patient's daughter seems to be in denial. Will continue to follow for now.      Cammie Sickle, MD 10/08/2016 1:15 PM

## 2016-10-08 NOTE — Plan of Care (Signed)
Problem: Safety: Goal: Ability to remain free from injury will improve Outcome: Progressing Pt continues to remain free from falls/injury. Pt has stayed in bed this shift & uses the bedpan to void.  Problem: Fluid Volume: Goal: Ability to maintain a balanced intake and output will improve Outcome: Progressing Pt receiving IV fluids at 71m/hr  Problem: Cardiac: Goal: Ability to achieve and maintain adequate cardiopulmonary perfusion will improve Outcome: Progressing Pt continues to be on amiodarone, pt's heart rate continues to stay in the low 100's but has converted back to a sinus rhythm.

## 2016-10-08 NOTE — Care Management Important Message (Signed)
Important Message  Patient Details  Name: Megan Cisneros MRN: [id number] Date of Birth: 10-Jul-1956   Medicare Important Message Given:  Yes    Katrina Stack, RN 10/08/2016, 8:43 AM

## 2016-10-08 NOTE — Care Management (Signed)
patient admitted to 1C- transferred to ICU for hypotension- then  transferred to 2A 2/8 pm.  Amiodarone drip. She has extensvie malignancy.  Palliative care is following.  Prognosis poor.  She is a DNR

## 2016-10-08 NOTE — Progress Notes (Signed)
ANTICOAGULATION CONSULT NOTE -  Pharmacy Consult for Apixaban Indication: atrial fibrillation  No Known Allergies  Patient Measurements: Height: 5' (152.4 cm) Weight: 182 lb 4.8 oz (82.7 kg) IBW/kg (Calculated) : 45.5 Heparin Dosing Weight:   Vital Signs: Temp: 97.7 F (36.5 C) (02/09 1128) Temp Source: Oral (02/09 1128) BP: 137/54 (02/09 1128) Pulse Rate: 109 (02/09 1128)  Labs:  Recent Labs  10/06/16 1126 10/07/16 0516  HGB 10.4*  --   HCT 30.7*  --   PLT 226  --   CREATININE 1.64* 1.81*    Estimated Creatinine Clearance: 30.7 mL/min (by C-G formula based on SCr of 1.81 mg/dL (H)).   Medical History: Past Medical History:  Diagnosis Date  . Atrial flutter ([place])    a. Dx 11/2015 -->CHA2DS2VASc = 7-->Eliquis '5mg'$  BID.  Marland Kitchen Breast cancer ([place])   . Chronic diastolic CHF (congestive heart failure) ([place])    a. 11/2015 Echo: EF 60-65%, no rwma, mild MR, mildly dil LA, nl RV fxn.  . Chronic Dyspnea on Exertion    a. 2004 Stress test prior to LE bypass->reportedly nl.  . Diabetes mellitus without complication ([place])    a. Dx as borderline diabetic in the 80's-->progressed over the years.  . Essential hypertension   . Lower extremity edema   . Morbid obesity ([place])   . PVC's (premature ventricular contractions)   . PVD (peripheral vascular disease) ([place])    a. 2004 s/p RLE bypass - complicated by recurrent infection.  . Stroke [facility])    a. TIA x 2;  b. Stroke in 2007 in [place].    Medications:  Prescriptions Prior to Admission  Medication Sig Dispense Refill Last Dose  . apixaban (ELIQUIS) 5 MG TABS tablet Take 1 tablet (5 mg total) by mouth 2 (two) times daily. 60 tablet 3 10/01/2016 at 0800  . atorvastatin (LIPITOR) 80 MG tablet Take 80 mg by mouth daily at 6 PM.    10/01/2016 at 0800  . diltiazem (CARDIZEM CD) 120 MG 24 hr capsule Take 1 capsule (120 mg total) by mouth daily. 30 capsule 0 10/01/2016 at 0800  . furosemide (LASIX) 40 MG tablet Take 1 tablet (40 mg total) by  mouth 2 (two) times daily. (Patient taking differently: Take 40 mg by mouth daily. ) 180 tablet 3 10/01/2016 at 0800  . gabapentin (NEURONTIN) 100 MG capsule Take 1 capsule (100 mg total) by mouth daily. (Patient taking differently: Take 200 mg by mouth 2 (two) times daily. ) 30 capsule 0 10/01/2016 at 0800  . insulin detemir (LEVEMIR) 100 UNIT/ML injection Inject 0.05 mLs (5 Units total) into the skin at bedtime. (Patient taking differently: Inject 10 Units into the skin daily. ) 10 mL 11 10/01/2016 at 0800  . lisinopril (PRINIVIL,ZESTRIL) 10 MG tablet Take 1 tablet (10 mg total) by mouth daily. 30 tablet 0 10/01/2016 at 0800  . metFORMIN (GLUCOPHAGE) 500 MG tablet Take 1,000 mg by mouth 2 (two) times daily.    10/01/2016 at 0800  . metoprolol tartrate (LOPRESSOR) 25 MG tablet Take 1 tablet (25 mg total) by mouth 2 (two) times daily. 180 tablet 2 10/01/2016 at 0800  . Multiple Vitamin (MULTIVITAMIN WITH MINERALS) TABS tablet Take 1 tablet by mouth daily.   10/01/2016 at 0800  . omeprazole (PRILOSEC) 20 MG capsule Take 20 mg by mouth daily.   10/01/2016 at 0800  . oxybutynin (DITROPAN) 5 MG tablet Take 5 mg by mouth 2 (two) times daily.   10/01/2016 at 0800  . traZODone (  DESYREL) 100 MG tablet Take 100 mg by mouth 2 (two) times daily. Takes with gabapentin.   10/01/2016 at 0800  . VENTOLIN HFA 108 (90 Base) MCG/ACT inhaler Inhale 2 puffs into the lungs every 6 (six) hours as needed.    Taking   Scheduled:  . amiodarone  400 mg Oral BID  . apixaban  5 mg Oral BID  . atorvastatin  80 mg Oral q1800  . cephALEXin  500 mg Oral Q12H  . docusate sodium  100 mg Oral BID  . feeding supplement (ENSURE ENLIVE)  237 mL Oral TID WC & HS  . gabapentin  100 mg Oral BID  . insulin aspart  0-9 Units Subcutaneous TID WC  . insulin detemir  5 Units Subcutaneous QHS  . multivitamin with minerals  1 tablet Oral Daily  . oxybutynin  5 mg Oral BID  . pantoprazole  40 mg Oral Daily  . traZODone  100 mg Oral BID     Assessment: Pharmacy consulted to dose and monitor apixaban therapy in this 61 year old female for atrial fibrillation 61 yo, Wt=82.7 kg,  Scr 1.81  Goal of Therapy:  Monitor platelets by anticoagulation protocol: Yes   Plan:  Will continue apixaban 5 mg PO BID.   Michelle Vanhise A 10/08/2016,1:05 PM

## 2016-10-08 NOTE — Progress Notes (Signed)
MEDICATION RELATED CONSULT NOTE -  Pharmacy Consult for Magnesium Replacement    No Known Allergies   Recent Labs  10/06/16 1126 10/07/16 0516  WBC 9.4  --   HGB 10.4*  --   HCT 30.7*  --   PLT 226  --   CREATININE 1.64* 1.81*  MG 1.4*  --    Estimated Creatinine Clearance: 30.7 mL/min (by C-G formula based on SCr of 1.81 mg/dL (H)).  Medical History: Past Medical History:  Diagnosis Date  . Atrial flutter ([place])    a. Dx 11/2015 -->CHA2DS2VASc = 7-->Eliquis '5mg'$  BID.  Marland Kitchen Breast cancer ([place])   . Chronic diastolic CHF (congestive heart failure) ([place])    a. 11/2015 Echo: EF 60-65%, no rwma, mild MR, mildly dil LA, nl RV fxn.  . Chronic Dyspnea on Exertion    a. 2004 Stress test prior to LE bypass->reportedly nl.  . Diabetes mellitus without complication ([place])    a. Dx as borderline diabetic in the 80's-->progressed over the years.  . Essential hypertension   . Lower extremity edema   . Morbid obesity ([place])   . PVC's (premature ventricular contractions)   . PVD (peripheral vascular disease) ([place])    a. 2004 s/p RLE bypass - complicated by recurrent infection.  . Stroke [facility])    a. TIA x 2;  b. Stroke in 2007 in [place].     Assessment: 61 yo F with Hx Atrial fibrillation, Metastatic cancer.  Magnesium level on 10/06/16= 1.4,  Replaced with Magnesium 2 gram IV x 1.   Plan:  Will recheck Magnesium level in am of 2/10 and replace if needed.  Kenslie Abbruzzese A 10/08/2016,4:15 PM

## 2016-10-08 NOTE — Progress Notes (Signed)
Patient Name: Megan Cisneros Date of Encounter: 10/08/2016  Primary Cardiologist: [facility] Problem List     Principal Problem:   Metastatic lung cancer (metastasis from lung to other site) [facility] Active Problems:   Atrial fibrillation with RVR ([place])   Hypotension   Hypomagnesemia   Pressure injury of skin   Liver lesion   Palliative care by specialist   Goals of care, counseling/discussion   DNR (do not resuscitate) discussion   Metastatic cancer ([place])   Malignancy ([place])   DNR (do not resuscitate)   ATN (acute tubular necrosis) ([place])     Subjective   Remains in narrow complex tachycardia with regular R-R intervals without obvious P waves on telemetry. Was started on amiodarone infusion on 2/8. This has been been stopped this morning. It has been felt she is not a candidate for chemotherapy given her comorbidities. Planning for palliative radiation to the SVC. BP improved this AM currently in the 638'G systolic. Now DNR/DNI.   Inpatient Medications    Scheduled Meds: . apixaban  5 mg Oral BID  . atorvastatin  80 mg Oral q1800  . cephALEXin  500 mg Oral Q12H  . docusate sodium  100 mg Oral BID  . feeding supplement (ENSURE ENLIVE)  237 mL Oral TID WC & HS  . gabapentin  100 mg Oral BID  . insulin aspart  0-9 Units Subcutaneous TID WC  . insulin detemir  5 Units Subcutaneous QHS  . multivitamin with minerals  1 tablet Oral Daily  . oxybutynin  5 mg Oral BID  . pantoprazole  40 mg Oral Daily  . traZODone  100 mg Oral BID   Continuous Infusions: . sodium chloride    . sodium chloride 75 mL/hr at 10/07/16 2112  . amiodarone 60 mg/hr (10/08/16 0115)   PRN Meds: acetaminophen **OR** acetaminophen, albuterol, bisacodyl, HYDROcodone-acetaminophen, ondansetron **OR** ondansetron (ZOFRAN) IV, traZODone   Vital Signs    Vitals:   10/07/16 2156 10/08/16 0030 10/08/16 0506 10/08/16 0758  BP: 114/63 (!) 115/52 140/83 (!) 121/44  Pulse: (!) 109 (!) 102 (!) 102 (!)  105  Resp: '20 16 16 19  '$ Temp: 97.5 F (36.4 C) 97.5 F (36.4 C) 98 F (36.7 C) 97.4 F (36.3 C)  TempSrc: Oral Oral  Oral  SpO2: 92% 94% 94% 92%  Weight: 179 lb 12.8 oz (81.6 kg)  182 lb 4.8 oz (82.7 kg)   Height:        Intake/Output Summary (Last 24 hours) at 10/08/16 1031 Last data filed at 10/08/16 0700  Gross per 24 hour  Intake          3008.83 ml  Output              250 ml  Net          2758.83 ml   Filed Weights   10/06/16 2100 10/07/16 2156 10/08/16 0506  Weight: 173 lb 15.1 oz (78.9 kg) 179 lb 12.8 oz (81.6 kg) 182 lb 4.8 oz (82.7 kg)    Physical Exam    GEN: Frail appearing, in no acute distress.  HEENT: Grossly normal.  Neck: Supple, no JVD, carotid bruits, or masses. Cardiac: Tachycardic, no murmurs, rubs, or gallops. No clubbing, cyanosis, edema.  Radials/DP/PT 2+ and equal bilaterally.  Respiratory:  Respirations regular and unlabored, clear to auscultation bilaterally. GI: Soft, nontender, nondistended, BS + x 4. MS: no deformity or atrophy. Skin: warm and dry, no rash. Neuro:  Strength and sensation are  intact. Psych: AAOx3.  Normal affect.  Labs    CBC  Recent Labs  10/06/16 1126  WBC 9.4  HGB 10.4*  HCT 30.7*  MCV 86.3  PLT 811   Basic Metabolic Panel  Recent Labs  10/06/16 1126 10/07/16 0516  NA 138 140  K 4.1 4.1  CL 100* 104  CO2 27 24  GLUCOSE 165* 79  BUN 43* 46*  CREATININE 1.64* 1.81*  CALCIUM 7.6* 7.8*  MG 1.4*  --    Liver Function Tests No results for input(s): AST, ALT, ALKPHOS, BILITOT, PROT, ALBUMIN in the last 72 hours. No results for input(s): LIPASE, AMYLASE in the last 72 hours. Cardiac Enzymes No results for input(s): CKTOTAL, CKMB, CKMBINDEX, TROPONINI in the last 72 hours. BNP Invalid input(s): POCBNP D-Dimer No results for input(s): DDIMER in the last 72 hours. Hemoglobin A1C No results for input(s): HGBA1C in the last 72 hours. Fasting Lipid Panel No results for input(s): CHOL, HDL, LDLCALC, TRIG,  CHOLHDL, LDLDIRECT in the last 72 hours. Thyroid Function Tests No results for input(s): TSH, T4TOTAL, T3FREE, THYROIDAB in the last 72 hours.  Invalid input(s): FREET3  Telemetry    Narrow complex tachycardia with regular R-R intervals, no obvious P waves on telemetry, low 100's bpm - Personally Reviewed  ECG    pending - Personally Reviewed  Radiology    No results found.  Cardiac Studies   Echo 11/2015: Study Conclusions  - Left ventricle: The cavity size was normal. There was moderate   concentric hypertrophy. Near cavity obliteration in systole.   Systolic function was normal. The estimated ejection fraction was   in the range of 60% to 65%. Wall motion was normal; there were no   regional wall motion abnormalities. The study is not technically   sufficient to allow evaluation of LV diastolic function. - Mitral valve: There was mild regurgitation. - Left atrium: The atrium was mildly dilated. - Right ventricle: Systolic function was normal.  Impressions:  - Rhythm is atrial flutter with PVCs.  Patient Profile     61 y.o. female with history of atrial fibrillation status post recent cardioversion, HFpEF, hypertension, and remote stroke who presented to [facility] on 2/2 with generalized weakness and was found to have metastatic cancer  Assessment & Plan    1. Afib/flutter with RVR/hypotension: -Likely in the setting of her metastatic carcinoma -Heart rate reasonably controlled at this time given her comorbidites -At high risk of recurrent tachycardia with hypotension -Would continue amiodarone PO at this time -Check 12-lead EKG given telemetry  -CHDAS2VASc at least 5 (CHF, HTN, stroke x 2, female) -Back on Eliquis 5 mg bid (does not meet 2/3 reduced dosing criteria) -If she redevelops hypotension with significant tachycardia she may require TEE/DCCV -Would not pursue DCCV if she remains stable as she is unlikely to hold sinus rhythm. She does not want to pursue any  further procedures including TEE/DCCV unless absolutely necessary   2. Chronic diastolic CHF: -Lasix on hold 2/2 hypotension -Limit IV fluids  3. Metastatic carcinoma: -No plans for chemotherapy given the above -Palliative radiation to SVC only   4. Hypomagnesemia: -Replete to 2.0  5. AKI: -Likely ATN 2/2 hypotension -Gentle fluids  Signed, Christell Faith, PA-C [facility] Pager: [phone] 10/08/2016, 10:31 AM

## 2016-10-08 NOTE — Clinical Social Work Note (Signed)
PT still recommending SNF for short term rehab, patient being followed by Palliative.  Jones Broom. Norval Morton, MSW, [place]  10/08/2016 5:36 PM

## 2016-10-08 NOTE — Progress Notes (Signed)
Daily Progress Note   Patient Name: Megan Cisneros       Date: 10/08/2016 DOB: 01/28/1956  Age: 61 y.o. MRN#: [id number] Attending Physician: Max Sane, MD Primary Care Physician: Donnie Coffin, MD Admit Date: 10/01/2016  Reason for Consultation/Follow-up: Establishing goals of care  Subjective: Met with patient and daughter at bedside. Remains in afib on amio continuous infusion. Patient was able to sleep last night. Per daughter, still with poor appetite but tolerating Ensures. Patient complains of sacral discomfort from pressure ulcer-encouraged repositioning, barrier cream, and sacral foam for protection. Patient also complains of gas with no bowel movement since before admit. Instructed RN to give prn dulcolax.   Daughter very tearful this morning after conversation with Dr. Rogue Bussing. Feels her mother is "just going to lay there till the cancer kills her." Discussed concern with her ability to tolerate physical therapy/ambulation due to weakness, afib RVR, and SOB at rest and with exertion.   The patient understands poor prognosis and chemo/radiation being more harm than good to her. She is not afraid of dying and is at peace with "when the Reita Cliche takes her to heaven." She needs her daughter to accept reality and tell her it's ok to die. She continuously tells her daughter to "stop being a Nurse, adult" and let her be comfortable.   Provided emotional and spiritual support. Briefly discussed comfort measures, focus of symptom management, and EOL expectations.   Length of Stay: 7  Current Medications: Scheduled Meds:  . apixaban  5 mg Oral BID  . atorvastatin  80 mg Oral q1800  . cephALEXin  500 mg Oral Q12H  . docusate sodium  100 mg Oral BID  . feeding supplement (ENSURE ENLIVE)  237 mL Oral  TID WC & HS  . gabapentin  100 mg Oral BID  . insulin aspart  0-9 Units Subcutaneous TID WC  . insulin detemir  5 Units Subcutaneous QHS  . multivitamin with minerals  1 tablet Oral Daily  . oxybutynin  5 mg Oral BID  . pantoprazole  40 mg Oral Daily  . traZODone  100 mg Oral BID    Continuous Infusions: . sodium chloride    . sodium chloride 75 mL/hr at 10/07/16 2112  . amiodarone 60 mg/hr (10/08/16 0115)    PRN Meds: acetaminophen **OR** acetaminophen, albuterol, bisacodyl, HYDROcodone-acetaminophen,  ondansetron **OR** ondansetron (ZOFRAN) IV, traZODone  Physical Exam  Constitutional: She is oriented to person, place, and time. She is cooperative. She appears ill.  HENT:  Head: Normocephalic and atraumatic.  Cardiovascular: An irregularly irregular rhythm present.  afib-105 on tele  Pulmonary/Chest: No accessory muscle usage. She has decreased breath sounds.  Dyspnea at rest and with movement in bed  Abdominal: Soft. Normal appearance and bowel sounds are normal. There is no tenderness.  Musculoskeletal: She exhibits edema (BUE).  Neurological: She is alert and oriented to person, place, and time.  Skin: Skin is warm and dry. There is pallor.  Psychiatric: She has a normal mood and affect. Her speech is normal and behavior is normal. Cognition and memory are normal.  Nursing note and vitals reviewed.          Vital Signs: BP (!) 121/44 (BP Location: Left Arm)   Pulse (!) 105   Temp 97.4 F (36.3 C) (Oral)   Resp 19   Ht 5' (1.524 m)   Wt 82.7 kg (182 lb 4.8 oz)   SpO2 92%   BMI 35.60 kg/m  SpO2: SpO2: 92 % O2 Device: O2 Device: Nasal Cannula O2 Flow Rate: O2 Flow Rate (L/min): 5 L/min  Intake/output summary:   Intake/Output Summary (Last 24 hours) at 10/08/16 2025 Last data filed at 10/08/16 0700  Gross per 24 hour  Intake          3508.83 ml  Output              250 ml  Net          3258.83 ml   LBM: Last BM Date: 10/07/16 Baseline Weight: Weight: 76.7 kg  (169 lb) Most recent weight: Weight: 82.7 kg (182 lb 4.8 oz)       Palliative Assessment/Data: PPS 30%   Flowsheet Rows   Flowsheet Row Most Recent Value  Intake Tab  Referral Department  Hospitalist  Unit at Time of Referral  Oncology Unit  Palliative Care Primary Diagnosis  Cancer  Date Notified  10/04/16  Palliative Care Type  New Palliative care  Reason for referral  Clarify Goals of Care  Date of Admission  10/01/16  Date first seen by Palliative Care  10/05/16  # of days Palliative referral response time  1 Day(s)  # of days IP prior to Palliative referral  3  Clinical Assessment  Palliative Performance Scale Score  30%  Psychosocial & Spiritual Assessment  Palliative Care Outcomes  Patient/Family meeting held?  Yes  Who was at the meeting?  patient and daughter  Palliative Care Outcomes  Clarified goals of care, Provided end of life care assistance, Provided psychosocial or spiritual support, Counseled regarding hospice      Patient Active Problem List   Diagnosis Date Noted  . Malignancy ([place])   . DNR (do not resuscitate)   . ATN (acute tubular necrosis) ([place])   . Atrial fibrillation with RVR ([place]) 10/06/2016  . Hypotension 10/06/2016  . Metastatic cancer ([place])   . Liver lesion   . Palliative care by specialist   . Goals of care, counseling/discussion   . DNR (do not resuscitate) discussion   . Pressure injury of skin 10/02/2016  . Metastatic lung cancer (metastasis from lung to other site) ([place]) 10/01/2016  . UTI (urinary tract infection) 08/06/2016  . Tinea corporis 08/06/2016  . Muscle weakness (generalized)   . Notalgia   . Hypokalemia 08/05/2016  . Hypomagnesemia 08/05/2016  . Cellulitis of left  lower extremity   . Chest pain   . Essential hypertension 01/19/2016  . Acute on chronic diastolic CHF (congestive heart failure) ([place])   . Shortness of breath   . Bilateral leg edema   . SOB (shortness of breath)   . Morbid obesity due to excess calories  ([place])   . CHF (congestive heart failure) ([place]) 12/11/2015    Palliative Care Assessment & Plan   Patient Profile: 61 y.o. female  with past medical history of CVA, PVD, essential  hypertension, DM, diastolic CHF, afib/flutter, and breast cancer admitted on 10/01/2016 with weakness, fall, rectal bleeding, and weight loss. Complains of rectal bleeding for 1 month without evidence of abdominal pain. Per daughter, patient has lost about 20 lbs in 2 months and with decreased appetite. CT chest/pelvis reveals hilar/mediastinal adenopathy concerning for superior vena cava compression, multiple liver lesions, retroperitoneal lymph nodes, and left-sided kidney mass approximately 4cm. Evaluated by Dr. Gay Filler for primary lung/small cell with metastases to liver. Plan for US liver biopsy today, 2/6. Radiation oncology consulted and plans to start palliative radiation therapy to chest. Palliative medicine consultation for goals of care. On 2/7, patient now in afib RVR and hypotensive (66/41). Cardiology consulted. Patient transferred to stepdown. On amiodorone infusion.   Assessment: Generalized weakness Metastatic cancer-possible right lung, liver, retroperitoneal nodes, and left renal mass UTI L4 fracture (? Pathologic) Afib Diastolic CHF  Recommendations/Plan:  DNR/DNI  Patient understands poor prognosis. Daughter remains in denial and needs more time to process.   If patient continues to decline, likely eligible for hospice home next week.   PMT not at [facility] over the weekend but will f/u next week and continue conversations with patient and family.   Code Status: DNR   Code Status Orders        Start     Ordered   10/01/16 1945  Full code  Continuous     10/01/16 1947    Code Status History    Date Active Date Inactive Code Status Order ID Comments User Context   08/05/2016  1:47 PM 08/09/2016  8:09 PM Full Code [id number]  Gladstone Lighter, MD ED   12/12/2015 12:41 AM  12/13/2015  4:13 PM Full Code [id number]  Fritzi Mandes, MD ED       Prognosis:   Unable to determine guarded with new diagnosis of metastatic cancer-lung, liver, retroperitoneal nodes, and kidney mass. Also with history of stroke, afib, and CHF.  Discharge Planning:  To Be Determined  Care plan was discussed with patient, daughter, Dr. Rogue Bussing, and RN.  Thank you for allowing the Palliative Medicine Team to assist in the care of this patient.   Time In: 0800 Time Out: 0835 Total Time 69mn Prolonged Time Billed  no      Greater than 50%  of this time was spent counseling and coordinating care related to the above assessment and plan.  MIhor Dow FNP-C Palliative Medicine Team  Phone: [phone]: [phone] Please contact Palliative Medicine Team phone at [phone] questions and concerns.

## 2016-10-08 NOTE — Progress Notes (Addendum)
[place] at [place] NAME: Megan Cisneros    MR#:  [id number]  DATE OF BIRTH:  1956-08-14  SUBJECTIVE:  CHIEF COMPLAINT:   Chief Complaint  Patient presents with  . Weakness  She chooses to be comfortable only, but rest of her family still not quite ready to accept this.  Her daughter is in denial and wants her to fight this.  Patient is very weak REVIEW OF SYSTEMS:  CONSTITUTIONAL: No fever,positive for fatigue or weakness.  EYES: No blurred or double vision.  EARS, NOSE, AND THROAT: No tinnitus or ear pain.  RESPIRATORY: No cough, shortness of breath, wheezing or hemoptysis.  CARDIOVASCULAR: No chest pain, orthopnea, edema.  GASTROINTESTINAL: No nausea, vomiting, diarrhea or abdominal pain.  GENITOURINARY: No dysuria, hematuria.  ENDOCRINE: No polyuria, nocturia,  HEMATOLOGY: No anemia, easy bruising or bleeding SKIN: No rash or lesion. MUSCULOSKELETAL: No joint pain or arthritis.   NEUROLOGIC: No tingling, numbness, weakness.  PSYCHIATRY: No anxiety or depression.   ROS  DRUG ALLERGIES:  No Known Allergies  VITALS:  Blood pressure (!) 137/54, pulse (!) 109, temperature 97.7 F (36.5 C), temperature source Oral, resp. rate 19, height 5' (1.524 m), weight 82.7 kg (182 lb 4.8 oz), SpO2 92 %.  PHYSICAL EXAMINATION:  GENERAL:  61 y.o.-year-old patient lying in the bed with no acute distress.  EYES: Pupils equal, round, reactive to light and accommodation. No scleral icterus. Extraocular muscles intact.  HEENT: Head atraumatic, normocephalic. Oropharynx and nasopharynx clear. Oral mucosa dry. NECK:  Supple, no jugular venous distention. No thyroid enlargement, no tenderness.  LUNGS: Normal breath sounds bilaterally, no wheezing, rales,rhonchi or crepitation. No use of accessory muscles of respiration.  CARDIOVASCULAR: S1, S2 fast and regular. No murmurs, rubs, or gallops.  ABDOMEN: Soft, nontender, nondistended. Bowel sounds present. No  organomegaly or mass.  EXTREMITIES: No pedal edema, cyanosis, or clubbing.  NEUROLOGIC: Cranial nerves II through XII are intact. Muscle strength 4/5 in all extremities. Sensation intact. Gait not checked.  PSYCHIATRIC: The patient is Lethargic.  SKIN: No obvious rash, lesion, or ulcer.   Physical Exam LABORATORY PANEL:   CBC  Recent Labs Lab 10/06/16 1126  WBC 9.4  HGB 10.4*  HCT 30.7*  PLT 226   ------------------------------------------------------------------------------------------------------------------  Chemistries   Recent Labs Lab 10/06/16 1126 10/07/16 0516  NA 138 140  K 4.1 4.1  CL 100* 104  CO2 27 24  GLUCOSE 165* 79  BUN 43* 46*  CREATININE 1.64* 1.81*  CALCIUM 7.6* 7.8*  MG 1.4*  --    ------------------------------------------------------------------------------------------------------------------  Cardiac Enzymes  Recent Labs Lab 10/01/16 1408  TROPONINI 0.04*   ------------------------------------------------------------------------------------------------------------------  RADIOLOGY:  No results found.  ASSESSMENT AND PLAN:  61 year old female patient currently admitted to the hospital for generalized weakness/UTI. Imaging shows concerns for metastatic - biopsy positive for small cell cancer  #1. Small cell lung cancer- with mets. Appreciate onco input. Any treatment from cancer perspective would be just palliative only. likely to preclude chemotherapy at this time. Radiation to the SVC- is purely palliative; and will not treat rest of the systemic disease.  - DNR/DNI per palliative care, Megan. Very poor prognosis. Daughter seem to be in denial - She is overall deemed appropriate for hospice.  Not sure if she would qualify for hospice home - Family not quite ready to accept this yet  #2 Staph Aureus UTI: oral keflex - total 5 days finished  #3 Afib/flutter with RVR/hypotension - Aged from amiodarone  drip to oral amiodarone 400 mg  twice a day  - on Eliquis for anticoagulation  #4.L4 superior endplate fracture, with nonacute appearance.    As per MRI mets in vertebra  Ortho input appreciated.   As per Dr.Menz, pt does not need kyphoplasty this time, unless need bone biopsy.   #5 Ch diastolic CHF - well compensated at this time   #6 Diabetes   Was on metformin, insulin sliding scale coverage.   Stop metformin as kidney func is getting worse, likely due to hypotension.  #7 Hypotension - resolved. monitor  #8 ARF: no labs today.  Last creatinine 1.8 - c/s Nephro   PT suggest SNF. Overall very prognosis may consider discharge to rehabilitation with palliative care to follow. with possible transition to hospice if family is in agreement.  Early next week   All the records are reviewed and case discussed with Care Management/Social Workerr. Management plans discussed with the patient, family and they are in agreement.  CODE STATUS: DNR, very poor prognosis  TOTAL TIME TAKING CARE OF THIS PATIENT: 45 minutes.   POSSIBLE D/C IN 3-4 DAYS, DEPENDING ON CLINICAL CONDITION.   Max Sane M.D on 10/08/2016   Between 7am to 6pm - Pager - [phone]  After 6pm go to [email or web address] - password [email or web address]  Office  [phone]  CC: Primary care physician; Donnie Coffin, MD  Note: This dictation was prepared with Dragon dictation along with smaller phrase technology. Any transcriptional errors that result from this process are unintentional.

## 2016-10-09 ENCOUNTER — Inpatient Hospital Stay: Payer: Medicare Other

## 2016-10-09 DIAGNOSIS — R531 Weakness: Secondary | ICD-10-CM

## 2016-10-09 DIAGNOSIS — R059 Cough, unspecified: Secondary | ICD-10-CM

## 2016-10-09 DIAGNOSIS — C799 Secondary malignant neoplasm of unspecified site: Secondary | ICD-10-CM

## 2016-10-09 DIAGNOSIS — R05 Cough: Secondary | ICD-10-CM

## 2016-10-09 LAB — BASIC METABOLIC PANEL
ANION GAP: 7 (ref 5–15)
BUN: 47 mg/dL — AB (ref 6–20)
CO2: 25 mmol/L (ref 22–32)
Calcium: 8.3 mg/dL — ABNORMAL LOW (ref 8.9–10.3)
Chloride: 105 mmol/L (ref 101–111)
Creatinine, Ser: 1.39 mg/dL — ABNORMAL HIGH (ref 0.44–1.00)
GFR calc Af Amer: 46 mL/min — ABNORMAL LOW (ref >60)
GFR calc non Af Amer: 40 mL/min — ABNORMAL LOW (ref >60)
GLUCOSE: 65 mg/dL (ref 65–99)
POTASSIUM: 3.9 mmol/L (ref 3.5–5.1)
Sodium: 137 mmol/L (ref 135–145)

## 2016-10-09 LAB — CBC
HEMATOCRIT: 31.1 % — AB (ref 35.0–47.0)
HEMOGLOBIN: 10.4 g/dL — AB (ref 12.0–16.0)
MCH: 28.8 pg (ref 26.0–34.0)
MCHC: 33.3 g/dL (ref 32.0–36.0)
MCV: 86.4 fL (ref 80.0–100.0)
Platelets: 233 10*3/uL (ref 150–440)
RBC: 3.6 MIL/uL — ABNORMAL LOW (ref 3.80–5.20)
RDW: 18.5 % — AB (ref 11.5–14.5)
WBC: 9.3 10*3/uL (ref 3.6–11.0)

## 2016-10-09 LAB — GLUCOSE, CAPILLARY
Glucose-Capillary: 58 mg/dL — ABNORMAL LOW (ref 65–99)
Glucose-Capillary: 72 mg/dL (ref 65–99)
Glucose-Capillary: 84 mg/dL (ref 65–99)

## 2016-10-09 LAB — MAGNESIUM: Magnesium: 1.7 mg/dL (ref 1.7–2.4)

## 2016-10-09 MED ORDER — LORAZEPAM 2 MG/ML IJ SOLN: 1.0000 mg | Freq: Four times a day (QID) | INTRAMUSCULAR | Status: DC | PRN

## 2016-10-09 MED ORDER — HYDROCODONE-ACETAMINOPHEN 5-325 MG PO TABS
0.5000 | ORAL_TABLET | Freq: Once | ORAL | Status: AC
Start: 2016-10-09 — End: 2016-10-09
  Administered 2016-10-09: 0.5 via ORAL
  Filled 2016-10-09: qty 1

## 2016-10-09 MED ORDER — METOPROLOL TARTRATE 5 MG/5ML IV SOLN
5.0000 mg | INTRAVENOUS | Status: DC | PRN
  Administered 2016-10-09: 5 mg via INTRAVENOUS
  Filled 2016-10-09: qty 5

## 2016-10-09 MED ORDER — MORPHINE SULFATE 15 MG PO TABS
15.0000 mg | ORAL_TABLET | ORAL | Status: DC | PRN
  Administered 2016-10-10: 15 mg via ORAL
  Filled 2016-10-09: qty 1

## 2016-10-09 MED ORDER — MORPHINE BOLUS VIA INFUSION: 5.0000 mg | INTRAVENOUS | Status: DC | PRN

## 2016-10-09 MED ORDER — MORPHINE SULFATE (PF) 4 MG/ML IV SOLN
1.0000 mg | INTRAVENOUS | Status: AC
  Administered 2016-10-09: 1 mg via INTRAVENOUS
  Filled 2016-10-09: qty 1

## 2016-10-09 MED ORDER — LORAZEPAM BOLUS VIA INFUSION: 2.0000 mg | INTRAVENOUS | Status: DC | PRN

## 2016-10-09 NOTE — Progress Notes (Signed)
Patient requesting half table of norco for pain, only order is for 1-2 tablets. Per Corene Cornea, PharmD, only way to document this would be to put in one time order. Spoke to Dr. Posey Pronto, ok to order 0.5 tablet norco once PO.

## 2016-10-09 NOTE — Progress Notes (Signed)
Patient heart rate sustaining 140's, BP 81/43. Patient currently asymptomatic. MD Jannifer Franklin notified. MD to place orders for 500 cc bolus. Will administer as ordered.   Update: Bolus administered. BP 103/60. Heart rate still sustaining 140's. MD Jannifer Franklin notified. MD to place PRN orders for heart rate control.   Update: Metoprolol 5 mg given once. HR now in the 90's. Will continue to monitor.   Iran Sizer M

## 2016-10-09 NOTE — Progress Notes (Signed)
Pt. Is under palliative care. Her family and her pastor surrounded patient. Pt. Was positive and she had a sense of humor as she was interacting with [place]. Chaplain spent about thirty minutes with patient, her family, and the prayed to conclude his visit.

## 2016-10-09 NOTE — Progress Notes (Signed)
[place] at [place] NAME: Megan Cisneros    MR#:  [id number]  DATE OF BIRTH:  1955/10/15  SUBJECTIVE:  CHIEF COMPLAINT:   Chief Complaint  Patient presents with  . Weakness  She is very clear about wanting to be comfortable only. HR up last night and required some metoprolol and saline bolus REVIEW OF SYSTEMS:  CONSTITUTIONAL: No fever,positive for fatigue or weakness.  EYES: No blurred or double vision.  EARS, NOSE, AND THROAT: No tinnitus or ear pain.  RESPIRATORY: No cough, shortness of breath, wheezing or hemoptysis.  CARDIOVASCULAR: No chest pain, orthopnea, edema.  GASTROINTESTINAL: No nausea, vomiting, diarrhea or abdominal pain.  GENITOURINARY: No dysuria, hematuria.  ENDOCRINE: No polyuria, nocturia,  HEMATOLOGY: No anemia, easy bruising or bleeding SKIN: No rash or lesion. MUSCULOSKELETAL: No joint pain or arthritis.   NEUROLOGIC: No tingling, numbness, weakness.  PSYCHIATRY: No anxiety or depression.   ROS  DRUG ALLERGIES:  No Known Allergies  VITALS:  Blood pressure (!) 128/57, pulse (!) 122, temperature 97.6 F (36.4 C), temperature source Oral, resp. rate 18, height 5' (1.524 m), weight 85.5 kg (188 lb 9.6 oz), SpO2 94 %.  PHYSICAL EXAMINATION:  GENERAL:  61 y.o.-year-old patient lying in the bed with no acute distress.  EYES: Pupils equal, round, reactive to light and accommodation. No scleral icterus. Extraocular muscles intact.  HEENT: Head atraumatic, normocephalic. Oropharynx and nasopharynx clear. Oral mucosa dry. NECK:  Supple, no jugular venous distention. No thyroid enlargement, no tenderness.  LUNGS: Normal breath sounds bilaterally, no wheezing, rales,rhonchi or crepitation. No use of accessory muscles of respiration.  CARDIOVASCULAR: S1, S2 fast and regular. No murmurs, rubs, or gallops.  ABDOMEN: Soft, nontender, nondistended. Bowel sounds present. No organomegaly or mass.  EXTREMITIES: No pedal edema,  cyanosis, or clubbing.  NEUROLOGIC: Cranial nerves II through XII are intact. Muscle strength 4/5 in all extremities. Sensation intact. Gait not checked.  PSYCHIATRIC: The patient is Lethargic.  SKIN: No obvious rash, lesion, or ulcer.   Physical Exam LABORATORY PANEL:   CBC  Recent Labs Lab 10/09/16 0623  WBC 9.3  HGB 10.4*  HCT 31.1*  PLT 233   ------------------------------------------------------------------------------------------------------------------  Chemistries   Recent Labs Lab 10/09/16 0623  NA 137  K 3.9  CL 105  CO2 25  GLUCOSE 65  BUN 47*  CREATININE 1.39*  CALCIUM 8.3*  MG 1.7   ------------------------------------------------------------------------------------------------------------------  Cardiac Enzymes No results for input(s): TROPONINI in the last 168 hours. ------------------------------------------------------------------------------------------------------------------  RADIOLOGY:  Dg Chest Port 1 View  Result Date: 10/09/2016 CLINICAL DATA:  Cough EXAM: PORTABLE CHEST 1 VIEW COMPARISON:  10/06/2016, CT 10/03/2016 FINDINGS: Interval increase in pleural and parenchymal disease of the right thorax with complete white out of the right thorax. Suspect that there is mild cardiomegaly. Atelectasis or infiltrate at the medial left lung base. Atherosclerosis. IMPRESSION: 1. Progression of pleural and parenchymal disease in the right thorax with complete white out of the right thorax. 2. Medial left lung base atelectasis or infiltrate Electronically Signed   By: Donavan Foil M.D.   On: 10/09/2016 01:44   ASSESSMENT AND PLAN:  61 year old female patient currently admitted to the hospital for generalized weakness/UTI. Imaging shows concerns for metastatic - biopsy positive for small cell cancer  #1. Small cell lung cancer- with mets. Appreciate onco input. Any treatment from cancer perspective would be just palliative only. likely to preclude  chemotherapy at this time. Radiation to the SVC- is purely palliative; and  will not treat rest of the systemic disease.  - DNR/DNI per palliative care, Megan. Very poor prognosis. Daughter seem to be in denial - She is overall deemed appropriate for hospice.  Not sure if she would qualify for hospice home - Family not quite ready to accept this yet, I had long d/w her Niece via phone and she is in agreement to keep her comfortable as well and she is planning to talk with daughter over the weekend. - will try morphine once to see if it helps for symptoms control - pt in agreement  #2 Staph Aureus UTI: oral keflex - total 5 days finished  #3 Afib/flutter with RVR/hypotension - cardio following - continue oral amiodarone 400 mg twice a day  - Her heart rate sustaining 140's, BP 81/43 last night. given 500 cc bolus. And given Metoprolol 5 mg given once for tachycardia - on Eliquis for anticoagulation - asked RN to place HR parameters till 130 (not to call MDs)  #4.L4 superior endplate fracture, with nonacute appearance.    As per MRI mets in vertebra  Ortho input appreciated.   As per Dr.Menz, pt does not need kyphoplasty this time, unless need bone biopsy.   #5 Ch diastolic CHF - well compensated at this time   #6 Diabetes   Was on metformin, insulin sliding scale coverage.   Stop metformin as kidney func was worse, likely due to hypotension.  #7 Hypotension - resolved. monitor  #8 ARF: no labs today. creatinine 1.8->1.3 - hold off Nephro c/s   PT suggest SNF. Overall very prognosis may consider discharge to rehabilitation with palliative care to follow. with possible transition to hospice if family is in agreement.  Early next week   All the records are reviewed and case discussed with Care Management/Social Workerr. Management plans discussed with the patient, family and they are in agreement.  CODE STATUS: DNR, very poor prognosis, patient wants to be comfortable only but  daughter not quite accepting (I had long d/w niece who will be talking with her daughter to convince her for Hospice) - it may be Monday before this decision gets finalized.   TOTAL TIME TAKING CARE OF THIS PATIENT: 25 minutes.   POSSIBLE D/C IN 2-3 DAYS, DEPENDING ON CLINICAL CONDITION.   Max Sane M.D on 10/09/2016   Between 7am to 6pm - Pager - [phone]  After 6pm go to [email or web address] - password [email or web address] [facility]  Office  [phone]  CC: Primary care physician; Donnie Coffin, MD  Note: This dictation was prepared with Dragon dictation along with smaller phrase technology. Any transcriptional errors that result from this process are unintentional.

## 2016-10-09 NOTE — Progress Notes (Signed)
Had d/w her daughter, niece and patient via phone - they r all agreeable to keep her comfortable and consider Hospice if she qualifies.  Comfort care orders placed. D/w nursing.

## 2016-10-09 NOTE — Progress Notes (Signed)
Hypoglycemic Event  CBG: 58  Treatment: 15 GM carbohydrate snack  Symptoms: None  Follow-up CBG: Time:0805 CBG Result:72  Possible Reasons for Event: Inadequate meal intake  Comments: patient now eating breakfast, no complaints    Terrilyn Saver

## 2016-10-09 NOTE — Progress Notes (Signed)
Patient: Megan Cisneros / Admit Date: 10/01/2016 / Date of Encounter: 10/09/2016, 12:43 PM   Hospital Problem List     Principal Problem:   Metastatic lung cancer (metastasis from lung to other site) [facility]) Active Problems:   Hypomagnesemia   Pressure injury of skin   Liver lesion   Palliative care by specialist   Goals of care, counseling/discussion   DNR (do not resuscitate) discussion   Metastatic cancer ([place])   Atrial fibrillation with RVR ([place])   Hypotension   Malignancy ([place])   DNR (do not resuscitate)   ATN (acute tubular necrosis) ([place])     Subjective   In bed, denies any pain, some mild shortness of breath, lethargic Drinking fluids, coughing when she drinks with a straw, does better drinking from a cup Likes everything cold with ice including ensure  Inpatient Medications    Scheduled Meds: . amiodarone  400 mg Oral BID  . apixaban  5 mg Oral BID  . atorvastatin  80 mg Oral q1800  . docusate sodium  100 mg Oral BID  . feeding supplement (ENSURE ENLIVE)  237 mL Oral TID WC & HS  . gabapentin  100 mg Oral BID  . insulin aspart  0-9 Units Subcutaneous TID WC  . insulin detemir  5 Units Subcutaneous QHS  . multivitamin with minerals  1 tablet Oral Daily  . oxybutynin  5 mg Oral BID  . pantoprazole  40 mg Oral Daily  . traZODone  100 mg Oral BID   Continuous Infusions: . sodium chloride    . sodium chloride 75 mL/hr at 10/08/16 1123   PRN Meds: acetaminophen **OR** acetaminophen, albuterol, bisacodyl, HYDROcodone-acetaminophen, metoprolol, ondansetron **OR** ondansetron (ZOFRAN) IV, traZODone   Objective: Telemetry: Sinus tachycardia rate 100 bpm Physical Exam: Blood pressure (!) 94/55, pulse (!) 108, temperature 97.7 F (36.5 C), temperature source Oral, resp. rate 18, height 5' (1.524 m), weight 188 lb 9.6 oz (85.5 kg), SpO2 98 %. Body mass index is 36.83 kg/m. GEN: Well nourished, well developed, in no acute distress. lethargic HEENT: Grossly normal.    Neck: Supple, no JVD, carotid bruits, or masses. Cardiac:  regular heart rhythm, mildly Rapid rate, no murmurs, rubs, or gallops. No clubbing, cyanosis, edema.  Radials/DP/PT 2+ and equal bilaterally.  Respiratory:  Respirations regular and unlabored, Rales bilaterally, unable to listen posteriorly as she is weak GI: Soft, nontender, nondistended, BS + x 4. MS: no deformity or atrophy. Skin: warm and dry, no rash. Neuro:  Not fully tested  Psych: arousable,  conversant   Intake/Output Summary (Last 24 hours) at 10/09/16 1243 Last data filed at 10/09/16 1155  Gross per 24 hour  Intake              720 ml  Output                0 ml  Net              720 ml     Labs: CBC  Recent Labs  10/09/16 0623  WBC 9.3  HGB 10.4*  HCT 31.1*  MCV 86.4  PLT 063   Basic Metabolic Panel  Recent Labs  10/07/16 0516 10/08/16 1558 10/09/16 0623  NA 140  --  137  K 4.1  --  3.9  CL 104  --  105  CO2 24  --  25  GLUCOSE 79  --  65  BUN 46*  --  47*  CREATININE 1.81*  --  1.39*  CALCIUM 7.8*  --  8.3*  MG  --  1.8 1.7   Liver Function Tests No results for input(s): AST, ALT, ALKPHOS, BILITOT, PROT, ALBUMIN in the last 72 hours. No results for input(s): LIPASE, AMYLASE in the last 72 hours. Cardiac Enzymes No results for input(s): CKTOTAL, CKMB, CKMBINDEX, TROPONINI in the last 72 hours. BNP Invalid input(s): POCBNP D-Dimer No results for input(s): DDIMER in the last 72 hours. Hemoglobin A1C No results for input(s): HGBA1C in the last 72 hours. Fasting Lipid Panel No results for input(s): CHOL, HDL, LDLCALC, TRIG, CHOLHDL, LDLDIRECT in the last 72 hours. Thyroid Function Tests No results for input(s): TSH, T4TOTAL, T3FREE, THYROIDAB in the last 72 hours.  Invalid input(s): FREET3  Weights: Filed Weights   10/07/16 2156 10/08/16 0506 10/09/16 0427  Weight: 179 lb 12.8 oz (81.6 kg) 182 lb 4.8 oz (82.7 kg) 188 lb 9.6 oz (85.5 kg)     Radiology/Studies:  Ct Chest W  Contrast  Result Date: 10/03/2016  IMPRESSION: 1. Bulky mediastinal and right hilar lymphadenopathy. Imminent SVC occlusion. 2. Large right upper lobe lung mass and surrounding interstitial thickening suggesting interstitial spread of tumor. 3. Diffuse hepatic metastatic disease and bulky upper abdominal and retroperitoneal lymphadenopathy. 4. Complex enhancing partially calcified left upper pole renal mass most likely renal cell carcinoma. 5. Suspect sternal metastatic disease. I do not see any other obvious bone lesions. 6. Advanced atherosclerotic calcifications involving the thoracic and abdominal aorta and branch vessels. Electronically Signed   By: Marijo Sanes M.D.   On: 10/03/2016 16:26   Mr Lumbar Spine Wo Contrast  Result Date: 10/04/2016  IMPRESSION: 1. Diffuse osseous metastatic disease. 2. L4 superior endplate fracture with mild height loss favored to be chronic. 3. Moderate spinal stenosis and left greater than right lateral recess stenosis at L4-5. 4. Mild-to-moderate spinal stenosis at L3-4. Electronically Signed   By: Logan Bores M.D.   On: 10/04/2016 12:04   Ct Abdomen Pelvis W Contrast  Result Date: 10/03/2016 . IMPRESSION: 1. Bulky mediastinal and right hilar lymphadenopathy. Imminent SVC occlusion. 2. Large right upper lobe lung mass and surrounding interstitial thickening suggesting interstitial spread of tumor. 3. Diffuse hepatic metastatic disease and bulky upper abdominal and retroperitoneal lymphadenopathy. 4. Complex enhancing partially calcified left upper pole renal mass most likely renal cell carcinoma. 5. Suspect sternal metastatic disease. I do not see any other obvious bone lesions. 6. Advanced atherosclerotic calcifications involving the thoracic and abdominal aorta and branch vessels. Electronically Signed   By: Marijo Sanes M.D.   On: 10/03/2016 16:26   US Biopsy  Result Date: 10/05/2016  IMPRESSION: Successful ultrasound-guided core biopsy of a right hepatic lesion.  Electronically Signed   By: Markus Daft M.D.   On: 10/05/2016 17:27   Dg Chest Port 1 View  Result Date: 10/09/2016  IMPRESSION: 1. Progression of pleural and parenchymal disease in the right thorax with complete white out of the right thorax. 2. Medial left lung base atelectasis or infiltrate Electronically Signed   By: Donavan Foil M.D.   On: 10/09/2016 01:44   Ct Renal Stone Study Result Date: 10/01/2016 IMPRESSION: 1. Extensive metastatic disease in the liver and retroperitoneal lymph nodes. Patient has history of breast cancer and there is a 4 cm left renal mass compatible with renal cell carcinoma. 2. Asymmetric left bladder wall thickening. 3. L4 superior endplate fracture, with nonacute appearance. This may be pathologic. Electronically Signed   By: Monte Fantasia M.D.   On:  10/01/2016 17:57     Assessment and Plan  61 y.o. female  1. Afib/flutter with RVR/hypotension: Regular narrow complex rhythm, little rate variability over the past 12 hours Appears to be sinus tachycardia on EKG --Would continue amiodarone as you were doing for rhythm control Unable to add other medications for rate control as blood pressure low, less than 791 systolic Rate likely driven by underlying illness, anemia -[id number] at least 5 (CHF, HTN, stroke x 2, female) -Back on Eliquis 5 mg bid (does not meet 2/3 reduced dosing criteria) High risk of thrombus given underlying cancer  2. Chronic diastolic CHF: -Lasix on hold 2/2 hypotension -Limit IV fluids as high risk for CHF She is drinking fluids with family support  3. Metastatic carcinoma: -No plans for chemotherapy given the above -Palliative radiation was considered but patient unable to lay flat Family reports she is considering hospice home  4. AKI: -Likely ATN  Improved on recent BMP   Signed, Esmond Plants, MD, Ph.D. [facility] 10/09/2016, 12:43 PM

## 2016-10-09 NOTE — Consult Note (Signed)
[facility]  CONSULT NOTE    Date: 10/09/2016                  Patient Name:  Megan Cisneros  MRN: [id number]  DOB: 02/03/1956  Age / Sex: 61 y.o., female         PCP: Donnie Coffin, MD                 Service Requesting Consult: Dr. Rogue Bussing                 Reason for Consult: Acute renal failure            History of Present Illness: Megan Cisneros is a 61 y.o. white female with metastatic small cell lung cancer, atrial fibrillation, hypertension, peripheral vascular disease, CVA, diabetes mellitus type II  who was admitted to [facility] on 10/01/2016 for Metastatic cancer ([place]) [C79.9] Weakness [R53.1] Urinary tract infection with hematuria, site unspecified [N39.0, R31.9]   Nephrology consulted for acute renal failure on chronic kidney disease stage III. Creatinine peaked at 1.81 yesterday. She has been getting sodium chloride infusion since 2/6. She endorses peripheral edema. She states she is starting to eat and drink better since hospitalized.   Started on an amiodarone gtt for her atrial fibrillation with rapid ventricular response.   Pain in back, abdomen, and right flank.   Being treated for urinary tract infection with cephelexin. MSSA. Urine culture on 2/2. Completed antibiotics.    Medications: Outpatient medications: Prescriptions Prior to Admission  Medication Sig Dispense Refill Last Dose  . apixaban (ELIQUIS) 5 MG TABS tablet Take 1 tablet (5 mg total) by mouth 2 (two) times daily. 60 tablet 3 10/01/2016 at 0800  . atorvastatin (LIPITOR) 80 MG tablet Take 80 mg by mouth daily at 6 PM.    10/01/2016 at 0800  . diltiazem (CARDIZEM CD) 120 MG 24 hr capsule Take 1 capsule (120 mg total) by mouth daily. 30 capsule 0 10/01/2016 at 0800  . furosemide (LASIX) 40 MG tablet Take 1 tablet (40 mg total) by mouth 2 (two) times daily. (Patient taking differently: Take 40 mg by mouth daily. ) 180 tablet 3 10/01/2016 at 0800  . gabapentin (NEURONTIN) 100 MG capsule  Take 1 capsule (100 mg total) by mouth daily. (Patient taking differently: Take 200 mg by mouth 2 (two) times daily. ) 30 capsule 0 10/01/2016 at 0800  . insulin detemir (LEVEMIR) 100 UNIT/ML injection Inject 0.05 mLs (5 Units total) into the skin at bedtime. (Patient taking differently: Inject 10 Units into the skin daily. ) 10 mL 11 10/01/2016 at 0800  . lisinopril (PRINIVIL,ZESTRIL) 10 MG tablet Take 1 tablet (10 mg total) by mouth daily. 30 tablet 0 10/01/2016 at 0800  . metFORMIN (GLUCOPHAGE) 500 MG tablet Take 1,000 mg by mouth 2 (two) times daily.    10/01/2016 at 0800  . metoprolol tartrate (LOPRESSOR) 25 MG tablet Take 1 tablet (25 mg total) by mouth 2 (two) times daily. 180 tablet 2 10/01/2016 at 0800  . Multiple Vitamin (MULTIVITAMIN WITH MINERALS) TABS tablet Take 1 tablet by mouth daily.   10/01/2016 at 0800  . omeprazole (PRILOSEC) 20 MG capsule Take 20 mg by mouth daily.   10/01/2016 at 0800  . oxybutynin (DITROPAN) 5 MG tablet Take 5 mg by mouth 2 (two) times daily.   10/01/2016 at 0800  . traZODone (DESYREL) 100 MG tablet Take 100 mg by mouth 2 (two) times daily. Takes with gabapentin.  10/01/2016 at 0800  . VENTOLIN HFA 108 (90 Base) MCG/ACT inhaler Inhale 2 puffs into the lungs every 6 (six) hours as needed.    Taking    Current medications: Current Facility-Administered Medications  Medication Dose Route Frequency Provider Last Rate Last Dose  . 0.9 %  sodium chloride infusion   Intravenous Continuous Arne Cleveland, MD      . 0.9 %  sodium chloride infusion   Intravenous Continuous Minna Merritts, MD 75 mL/hr at 10/08/16 1123    . acetaminophen (TYLENOL) tablet 650 mg  650 mg Oral Q6H PRN Epifanio Lesches, MD       Or  . acetaminophen (TYLENOL) suppository 650 mg  650 mg Rectal Q6H PRN Epifanio Lesches, MD      . albuterol (PROVENTIL) (2.5 MG/3ML) 0.083% nebulizer solution 2.5 mg  2.5 mg Inhalation Q4H PRN Epifanio Lesches, MD   2.5 mg at 10/07/16 8546  . amiodarone (PACERONE)  tablet 400 mg  400 mg Oral BID Areta Haber Dunn, PA-C   400 mg at 10/09/16 2703  . apixaban (ELIQUIS) tablet 5 mg  5 mg Oral BID Vaughan Basta, MD   5 mg at 10/09/16 0902  . atorvastatin (LIPITOR) tablet 80 mg  80 mg Oral q1800 Epifanio Lesches, MD   80 mg at 10/08/16 1700  . bisacodyl (DULCOLAX) EC tablet 5 mg  5 mg Oral Daily PRN Epifanio Lesches, MD   5 mg at 10/09/16 0902  . docusate sodium (COLACE) capsule 100 mg  100 mg Oral BID Epifanio Lesches, MD   100 mg at 10/09/16 0902  . feeding supplement (ENSURE ENLIVE) (ENSURE ENLIVE) liquid 237 mL  237 mL Oral TID WC & HS Max Sane, MD   237 mL at 10/08/16 2057  . gabapentin (NEURONTIN) capsule 100 mg  100 mg Oral BID Alexis Hugelmeyer, DO   100 mg at 10/09/16 0902  . HYDROcodone-acetaminophen (NORCO/VICODIN) 5-325 MG per tablet 1-2 tablet  1-2 tablet Oral Q4H PRN Epifanio Lesches, MD   2 tablet at 10/08/16 1116  . insulin aspart (novoLOG) injection 0-9 Units  0-9 Units Subcutaneous TID WC Vaughan Basta, MD   2 Units at 10/08/16 1154  . insulin detemir (LEVEMIR) injection 5 Units  5 Units Subcutaneous QHS Epifanio Lesches, MD   5 Units at 10/08/16 2057  . metoprolol (LOPRESSOR) injection 5 mg  5 mg Intravenous Q5 min PRN Lance Coon, MD   5 mg at 10/09/16 0041  . multivitamin with minerals tablet 1 tablet  1 tablet Oral Daily Epifanio Lesches, MD   1 tablet at 10/09/16 0901  . ondansetron (ZOFRAN) tablet 4 mg  4 mg Oral Q6H PRN Epifanio Lesches, MD       Or  . ondansetron (ZOFRAN) injection 4 mg  4 mg Intravenous Q6H PRN Epifanio Lesches, MD   4 mg at 10/02/16 1314  . oxybutynin (DITROPAN) tablet 5 mg  5 mg Oral BID Epifanio Lesches, MD   5 mg at 10/09/16 0902  . pantoprazole (PROTONIX) EC tablet 40 mg  40 mg Oral Daily Epifanio Lesches, MD   40 mg at 10/09/16 0902  . traZODone (DESYREL) tablet 100 mg  100 mg Oral BID Epifanio Lesches, MD   100 mg at 10/09/16 0902  . traZODone (DESYREL) tablet 25 mg  25  mg Oral QHS PRN Epifanio Lesches, MD          Allergies: No Known Allergies    Past Medical History: Past Medical History:  Diagnosis Date  .  Atrial flutter ([place])    a. Dx 11/2015 -->CHA2DS2VASc = 7-->Eliquis '5mg'$  BID.  Marland Kitchen Breast cancer ([place])   . Chronic diastolic CHF (congestive heart failure) ([place])    a. 11/2015 Echo: EF 60-65%, no rwma, mild MR, mildly dil LA, nl RV fxn.  . Chronic Dyspnea on Exertion    a. 2004 Stress test prior to LE bypass->reportedly nl.  . Diabetes mellitus without complication ([place])    a. Dx as borderline diabetic in the 80's-->progressed over the years.  . Essential hypertension   . Lower extremity edema   . Morbid obesity ([place])   . PVC's (premature ventricular contractions)   . PVD (peripheral vascular disease) ([place])    a. 2004 s/p RLE bypass - complicated by recurrent infection.  . Stroke [facility])    a. TIA x 2;  b. Stroke in 2007 in [place].     Past Surgical History: Past Surgical History:  Procedure Laterality Date  . Cataract Surgery Right    03/2014  . CESAREAN SECTION    . ELECTROPHYSIOLOGIC STUDY N/A 08/09/2016   Procedure: CARDIOVERSION;  Surgeon: Wellington Hampshire, MD;  Location: [facility] ORS;  Service: Cardiovascular;  Laterality: N/A;  . LEG SURGERY    . MASTECTOMY Left   . right leg bypass Right      Family History: Family History  Problem Relation Age of Onset  . Heart attack Father     died in his early 23's.  . Diabetes Father   . Cancer Mother     deceased  . Colon cancer Mother   . Cancer Brother     multiple cancers, deceased.  . Leukemia Brother   . Liver cancer Brother   . Pancreatic cancer Brother      Social History: Social History   Social History  . Marital status: Widowed    Spouse name: N/A  . Number of children: N/A  . Years of education: N/A   Occupational History  . Not on file.   Social History Main Topics  . Smoking status: Former Research scientist (life sciences)  . Smokeless tobacco: Never Used  . Alcohol use No  .  Drug use: No  . Sexual activity: Not on file   Other Topics Concern  . Not on file   Social History Narrative   Lives in [place] by herself.  Originally from [place], [place], later moved to [place] and then [place].  Has been in [place] for 8 yrs.  Family nearby.  Does not routinely exercise.   Has a cane and a walker     Review of Systems: Review of Systems  Constitutional: Positive for malaise/fatigue and weight loss. Negative for chills, diaphoresis and fever.  HENT: Negative for congestion, ear discharge, ear pain, hearing loss, nosebleeds, sinus pain, sore throat and tinnitus.   Eyes: Negative for blurred vision, double vision, photophobia, pain, discharge and redness.  Respiratory: Positive for cough, sputum production, shortness of breath and wheezing. Negative for hemoptysis and stridor.   Cardiovascular: Positive for orthopnea, claudication, leg swelling and PND. Negative for chest pain and palpitations.  Gastrointestinal: Positive for abdominal pain, constipation, nausea and vomiting. Negative for blood in stool, diarrhea, heartburn and melena.  Genitourinary: Positive for flank pain and frequency. Negative for dysuria, hematuria and urgency.  Musculoskeletal: Negative for back pain, falls, joint pain, myalgias and neck pain.  Skin: Negative for itching and rash.  Neurological: Positive for focal weakness and weakness. Negative for dizziness, tingling, tremors, sensory change, speech change, seizures, loss of consciousness and  headaches.  Endo/Heme/Allergies: Negative for environmental allergies and polydipsia. Does not bruise/bleed easily.  Psychiatric/Behavioral: Negative.  Negative for depression, hallucinations, memory loss, substance abuse and suicidal ideas. The patient is not nervous/anxious and does not have insomnia.     Vital Signs: Blood pressure (!) 128/57, pulse (!) 122, temperature 97.6 F (36.4 C), temperature source Oral, resp. rate 18, height 5' (1.524 m), weight 85.5 kg (188  lb 9.6 oz), SpO2 94 %.  Weight trends: Filed Weights   10/07/16 2156 10/08/16 0506 10/09/16 0427  Weight: 81.6 kg (179 lb 12.8 oz) 82.7 kg (182 lb 4.8 oz) 85.5 kg (188 lb 9.6 oz)    Physical Exam: General: NAD, laying in bed  Head: Normocephalic, atraumatic. Moist oral mucosal membranes  Eyes: Anicteric, PERRL  Neck: Supple, trachea midline  Lungs:  Clear to auscultation  Heart: Regular rate and rhythm  Abdomen:  Soft, nontender, obese  Extremities: trace peripheral edema.  Neurologic: Nonfocal, moving all four extremities  Skin: No lesions        Lab results: Basic Metabolic Panel:  Recent Labs Lab 10/06/16 1126 10/07/16 0516 10/08/16 1558 10/09/16 0623  NA 138 140  --  137  K 4.1 4.1  --  3.9  CL 100* 104  --  105  CO2 27 24  --  25  GLUCOSE 165* 79  --  65  BUN 43* 46*  --  47*  CREATININE 1.64* 1.81*  --  1.39*  CALCIUM 7.6* 7.8*  --  8.3*  MG 1.4*  --  1.8 1.7    Liver Function Tests: No results for input(s): AST, ALT, ALKPHOS, BILITOT, PROT, ALBUMIN in the last 168 hours. No results for input(s): LIPASE, AMYLASE in the last 168 hours. No results for input(s): AMMONIA in the last 168 hours.  CBC:  Recent Labs Lab 10/06/16 1126 10/09/16 0623  WBC 9.4 9.3  HGB 10.4* 10.4*  HCT 30.7* 31.1*  MCV 86.3 86.4  PLT 226 233    Cardiac Enzymes: No results for input(s): CKTOTAL, CKMB, CKMBINDEX, TROPONINI in the last 168 hours.  BNP: Invalid input(s): POCBNP  CBG:  Recent Labs Lab 10/08/16 1135 10/08/16 1647 10/08/16 2041 10/09/16 0728 10/09/16 0805  GLUCAP 192* 99 118* 92* 72    Microbiology: Results for orders placed or performed during the hospital encounter of 10/01/16  Urine culture     Status: Abnormal   Collection Time: 10/01/16  2:09 PM  Result Value Ref Range Status   Specimen Description URINE, CLEAN CATCH  Final   Special Requests Normal  Final   Culture >=100,000 COLONIES/mL STAPHYLOCOCCUS AUREUS (A)  Final   Report Status  10/04/2016 FINAL  Final   Organism ID, Bacteria STAPHYLOCOCCUS AUREUS (A)  Final      Susceptibility   Staphylococcus aureus - MIC*    CIPROFLOXACIN <=0.5 SENSITIVE Sensitive     GENTAMICIN <=0.5 SENSITIVE Sensitive     NITROFURANTOIN <=16 SENSITIVE Sensitive     OXACILLIN <=0.25 SENSITIVE Sensitive     TETRACYCLINE <=1 SENSITIVE Sensitive     VANCOMYCIN <=0.5 SENSITIVE Sensitive     TRIMETH/SULFA <=10 SENSITIVE Sensitive     CLINDAMYCIN <=0.25 SENSITIVE Sensitive     RIFAMPIN <=0.5 SENSITIVE Sensitive     Inducible Clindamycin NEGATIVE Sensitive     * >=100,000 COLONIES/mL STAPHYLOCOCCUS AUREUS  MRSA PCR Screening     Status: None   Collection Time: 10/06/16  9:22 PM  Result Value Ref Range Status   MRSA by PCR NEGATIVE NEGATIVE  Final    Comment:        The GeneXpert MRSA Assay (FDA approved for NASAL specimens only), is one component of a comprehensive MRSA colonization surveillance program. It is not intended to diagnose MRSA infection nor to guide or monitor treatment for MRSA infections.     Coagulation Studies: No results for input(s): LABPROT, INR in the last 72 hours.  Urinalysis: No results for input(s): COLORURINE, LABSPEC, PHURINE, GLUCOSEU, HGBUR, BILIRUBINUR, KETONESUR, PROTEINUR, UROBILINOGEN, NITRITE, LEUKOCYTESUR in the last 72 hours.  Invalid input(s): APPERANCEUR    Imaging: Dg Chest Port 1 View  Result Date: 10/09/2016 CLINICAL DATA:  Cough EXAM: PORTABLE CHEST 1 VIEW COMPARISON:  10/06/2016, CT 10/03/2016 FINDINGS: Interval increase in pleural and parenchymal disease of the right thorax with complete white out of the right thorax. Suspect that there is mild cardiomegaly. Atelectasis or infiltrate at the medial left lung base. Atherosclerosis. IMPRESSION: 1. Progression of pleural and parenchymal disease in the right thorax with complete white out of the right thorax. 2. Medial left lung base atelectasis or infiltrate Electronically Signed   By: Donavan Foil M.D.   On: 10/09/2016 01:44      Assessment & Plan: Ms. Elka Satterfield is a 61 y.o. white female with metastatic small cell lung cancer, atrial fibrillation, hypertension, peripheral vascular disease, CVA, diabetes mellitus type II  who was admitted to [facility] on 10/01/2016 for Metastatic cancer ([place]) [C79.9] Weakness [R53.1] Urinary tract infection with hematuria, site unspecified [N39.0, R31.9]   1. Acute renal failure 2. Urinary Tract Infection:  3. Anemia:   Continue support care with IV fluids.  Overall prognosis is poor. Patient is interested in palliative care. Will sign off. Please call with questions.   LOS: 8 Lorayne Getchell 2/10/201810:39 AM

## 2016-10-09 NOTE — Clinical Social Work Note (Signed)
CSW received consult for hospice home referral. CSW will send referral to [facility]. Currently, no beds are available and at least 2 other patients are ahead on the wait list. CSW following.  Santiago Bumpers, MSW, Latanya Presser [phone]

## 2016-10-10 DIAGNOSIS — M4856XS Collapsed vertebra, not elsewhere classified, lumbar region, sequela of fracture: Secondary | ICD-10-CM

## 2016-10-10 LAB — GLUCOSE, CAPILLARY: Glucose-Capillary: 166 mg/dL — ABNORMAL HIGH (ref 65–99)

## 2016-10-10 MED ORDER — LORAZEPAM 2 MG/ML IJ SOLN
1.0000 mg | INTRAMUSCULAR | Status: DC | PRN
  Administered 2016-10-10: 1 mg via INTRAVENOUS
  Filled 2016-10-10: qty 1

## 2016-10-10 MED ORDER — MORPHINE SULFATE (PF) 2 MG/ML IV SOLN
2.0000 mg | INTRAVENOUS | Status: DC | PRN
  Administered 2016-10-10 (×2): 2 mg via INTRAVENOUS
  Filled 2016-10-10 (×2): qty 1

## 2016-10-10 MED ORDER — LORAZEPAM 2 MG/ML IJ SOLN: 1.0000 mg | INTRAMUSCULAR | 0 refills | Status: AC | PRN

## 2016-10-10 MED ORDER — MORPHINE SULFATE (PF) 2 MG/ML IV SOLN
2.0000 mg | INTRAVENOUS | 0 refills | Status: AC | PRN
Start: 2016-10-10 — End: ?

## 2016-10-10 NOTE — Progress Notes (Signed)
Hematology/Oncology Consult note [facility]  Telephone:([phone] [phone]  Patient Care Team: Donnie Coffin, MD as PCP - General (Family Medicine) Minna Merritts, MD as Consulting Physician (Cardiology)   Name of the patient: Megan Cisneros  [id number]  May 09, 1956   Date of visit: 10/10/2016  Interval history- Patient resting comfortably after she was agitated this morning. Patients daughter is at the [place] who is thankful for the care she has received thus far   Review of systems- Review of Systems  Unable to perform ROS: Mental status change      No Known Allergies   Past Medical History:  Diagnosis Date  . Atrial flutter ([place])    a. Dx 11/2015 -->CHA2DS2VASc = 7-->Eliquis '5mg'$  BID.  Marland Kitchen Breast cancer ([place])   . Chronic diastolic CHF (congestive heart failure) ([place])    a. 11/2015 Echo: EF 60-65%, no rwma, mild MR, mildly dil LA, nl RV fxn.  . Chronic Dyspnea on Exertion    a. 2004 Stress test prior to LE bypass->reportedly nl.  . Diabetes mellitus without complication ([place])    a. Dx as borderline diabetic in the 80's-->progressed over the years.  . Essential hypertension   . Lower extremity edema   . Morbid obesity ([place])   . PVC's (premature ventricular contractions)   . PVD (peripheral vascular disease) ([place])    a. 2004 s/p RLE bypass - complicated by recurrent infection.  . Stroke [facility])    a. TIA x 2;  b. Stroke in 2007 in [place].     Past Surgical History:  Procedure Laterality Date  . Cataract Surgery Right    03/2014  . CESAREAN SECTION    . ELECTROPHYSIOLOGIC STUDY N/A 08/09/2016   Procedure: CARDIOVERSION;  Surgeon: Wellington Hampshire, MD;  Location: [facility] ORS;  Service: Cardiovascular;  Laterality: N/A;  . LEG SURGERY    . MASTECTOMY Left   . right leg bypass Right     Social History   Social History  . Marital status: Widowed    Spouse name: N/A  . Number of children: N/A  . Years of education: N/A    Occupational History  . Not on file.   Social History Main Topics  . Smoking status: Former Research scientist (life sciences)  . Smokeless tobacco: Never Used  . Alcohol use No  . Drug use: No  . Sexual activity: Not on file   Other Topics Concern  . Not on file   Social History Narrative   Lives in [place] by herself.  Originally from [place], [place], later moved to [place] and then [place].  Has been in [place] for 8 yrs.  Family nearby.  Does not routinely exercise.   Has a cane and a walker    Family History  Problem Relation Age of Onset  . Heart attack Father     died in his early 71's.  . Diabetes Father   . Cancer Mother     deceased  . Colon cancer Mother   . Cancer Brother     multiple cancers, deceased.  . Leukemia Brother   . Liver cancer Brother   . Pancreatic cancer Brother      Current Facility-Administered Medications:  .  0.9 %  sodium chloride infusion, , Intravenous, Continuous, Arne Cleveland, MD .  acetaminophen (TYLENOL) tablet 650 mg, 650 mg, Oral, Q6H PRN **OR** acetaminophen (TYLENOL) suppository 650 mg, 650 mg, Rectal, Q6H PRN, Epifanio Lesches, MD .  HYDROcodone-acetaminophen (NORCO/VICODIN) 5-325 MG per tablet  1-2 tablet, 1-2 tablet, Oral, Q4H PRN, Epifanio Lesches, MD, 1 tablet at 10/10/16 0336 .  LORazepam (ATIVAN) injection 1 mg, 1 mg, Intravenous, Q4H PRN, Max Sane, MD, 1 mg at 10/10/16 1228 .  morphine 2 MG/ML injection 2 mg, 2 mg, Intravenous, Q2H PRN, Max Sane, MD, 2 mg at 10/10/16 1215 .  ondansetron (ZOFRAN) tablet 4 mg, 4 mg, Oral, Q6H PRN **OR** ondansetron (ZOFRAN) injection 4 mg, 4 mg, Intravenous, Q6H PRN, Epifanio Lesches, MD, 4 mg at 10/02/16 1314 .  traZODone (DESYREL) tablet 25 mg, 25 mg, Oral, QHS PRN, Epifanio Lesches, MD  Physical exam:  Vitals:   10/09/16 1530 10/09/16 1950 10/10/16 0335 10/10/16 0700  BP:  127/66 128/63 122/65  Pulse:  (!) 119 (!) 122 78  Resp:  '16 18 20  '$ Temp:  98.2 F (36.8 C)  98.4 F (36.9 C)  TempSrc:  Oral    SpO2:  93% 94% 96% 94%  Weight:      Height:       Physical Exam  Constitutional:  Eyes closed. Patient appears comfortable and in no acute distress  Pulmonary/Chest: Effort normal.     CMP Latest Ref Rng & Units 10/09/2016  Glucose 65 - 99 mg/dL 65  BUN 6 - 20 mg/dL 47(H)  Creatinine 0.44 - 1.00 mg/dL 1.39(H)  Sodium 135 - 145 mmol/L 137  Potassium 3.5 - 5.1 mmol/L 3.9  Chloride 101 - 111 mmol/L 105  CO2 22 - 32 mmol/L 25  Calcium 8.9 - 10.3 mg/dL 8.3(L)  Total Protein 6.4 - 8.2 g/dL -  Total Bilirubin 0.2 - 1.0 mg/dL -  Alkaline Phos Unit/L -  AST 15 - 37 Unit/L -  ALT U/L -   CBC Latest Ref Rng & Units 10/09/2016  WBC 3.6 - 11.0 K/uL 9.3  Hemoglobin 12.0 - 16.0 g/dL 10.4(L)  Hematocrit 35.0 - 47.0 % 31.1(L)  Platelets 150 - 440 K/uL 233    '@IMAGES'$ @  Dg Chest 1 View  Result Date: 10/01/2016 CLINICAL DATA:  Acute onset of generalized weakness. Initial encounter. EXAM: CHEST 1 VIEW COMPARISON:  Chest radiographs performed 08/05/2016 and 12/11/2015 FINDINGS: Enlarging dense right hilar opacity is suspicious for malignancy. The left lung appears relatively clear. No pleural effusion or pneumothorax is seen. There is elevation of the right hemidiaphragm. The cardiomediastinal silhouette is normal in size. No acute osseous abnormalities are identified. IMPRESSION: Enlarging dense right hilar opacity is suspicious for malignancy. CT of the chest with contrast is recommended for further evaluation. Electronically Signed   By: Garald Balding M.D.   On: 10/01/2016 17:35   Dg Chest 2 View  Result Date: 10/06/2016 CLINICAL DATA:  Hypoxia.  History of breast carcinoma EXAM: CHEST  2 VIEW COMPARISON:  Chest radiograph October 01, 2016 and chest CT October 03, 2016 FINDINGS: There is opacification of most of the right with combination of consolidation, extensive adenopathy, and pleural effusion. There has been progression of consolidation effusion on the right compared to recent studies. Left lung is  clear. Heart size is normal. The left hilum and mediastinal region appears unremarkable. There is atherosclerotic calcification in the aorta. No obvious bony lesions are evident by radiography. The areas of concern for neoplastic focus in the sternum not appreciable by radiography. IMPRESSION: Extensive opacification throughout most of the right lung with progression from recent studies. The appearance is consistent with a combination of pleural effusion, extensive adenopathy comment consolidation. CT shows widespread neoplastic change throughout the right lung. Left lung appears clear.  Stable cardiac silhouette. Aortic atherosclerosis. Electronically Signed   By: Lowella Grip III M.D.   On: 10/06/2016 10:22   Dg Lumbar Spine 2-3 Views  Result Date: 10/02/2016 CLINICAL DATA:  Back pain and weakness for 2 days. EXAM: LUMBAR SPINE - 2-3 VIEW COMPARISON:  CT scan October 01, 2016 FINDINGS: Scalloping of the L4 superior endplate was better assessed on yesterday's CT scan and is unchanged. Degenerative changes in the lower thoracic spine. No other interval changes or bony abnormalities. IMPRESSION: No change since yesterday's CT scan. Continued scalloping of the L4 superior endplate. Electronically Signed   By: Dorise Bullion III M.D   On: 10/02/2016 17:33   Ct Head Wo Contrast  Result Date: 10/01/2016 CLINICAL DATA:  Weakness for 2 weeks EXAM: CT HEAD WITHOUT CONTRAST TECHNIQUE: Contiguous axial images were obtained from the base of the skull through the vertex without intravenous contrast. COMPARISON:  None. FINDINGS: Brain: No evidence of acute infarction, hemorrhage, extra-axial collection, ventriculomegaly, or mass effect. Generalized cerebral atrophy. Periventricular white matter low attenuation likely secondary to microangiopathy. Vascular: Cerebrovascular atherosclerotic calcifications are noted. Skull: Negative for fracture or focal lesion. Sinuses/Orbits: Visualized portions of the orbits are  unremarkable. Visualized portions of the paranasal sinuses and mastoid air cells are unremarkable. Other: None. IMPRESSION: 1. No acute intracranial pathology. 2. Chronic microvascular disease and cerebral atrophy. Electronically Signed   By: Kathreen Devoid   On: 10/01/2016 17:24   Ct Chest W Contrast  Result Date: 10/03/2016 CLINICAL DATA:  Metastatic breast cancer EXAM: CT CHEST, ABDOMEN, AND PELVIS WITH CONTRAST TECHNIQUE: Multidetector CT imaging of the chest, abdomen and pelvis was performed following the standard protocol during bolus administration of intravenous contrast. CONTRAST:  124m ISOVUE-300 IOPAMIDOL (ISOVUE-300) INJECTION 61% COMPARISON:  CT scan 10/01/2016 FINDINGS: CT CHEST FINDINGS Chest wall: Status post left mastectomy. No right-sided breast masses are identified. No axillary lymphadenopathy. 8 mm supraclavicular lymph node noted on image 2. There also enlarge subclavicular lymph nodes on the right side. The thyroid gland is grossly normal. Cardiovascular: The heart is within normal limits in size and stable. All stable dense atherosclerotic calcifications involving the aorta and branch vessels. No aneurysm or dissection. Three-vessel coronary artery calcifications are noted. Mediastinum/Nodes: There is extensive mediastinal and right hilar tumor. Large nodal masses throughout the mediastinum. The right paratracheal mass measures 4.8 x 3.9 cm on image 17. There is severe compression of the SVC but it is not completely occluded. Right hilar/ mediastinal mass on image number 25 measures 7.8 x 6.7 cm. Subcarinal nodal mass on image number 28 measures 34 x 30 mm. Lungs/Pleura: Lobulated right upper lobe lung mass on image number 65 measures 38 x 32 mm. Marked interstitial thickening on the right could suggest interstitial spread of tumor. The left lung is relatively clear. No acute pulmonary findings or worrisome pulmonary lesions. There are small bilateral pleural effusions. Musculoskeletal:  Mixed lytic and sclerotic sternal lesions are suspicious for metastatic disease. No other obvious bone lesions. CT ABDOMEN PELVIS FINDINGS Hepatobiliary: Index lesion in the right lobe on image number 53 measures 8.6 x 4.7 cm. Index lesion in the lateral segment of the left hepatic lobe on image number 52 measures 5.4 x 4.1 cm. The gallbladder is grossly normal. A stable calcified gallstone is noted. Small amount of pericholecystic fluid. Pancreas: No mass, inflammation or ductal dilatation. Spleen: Normal size.  No focal lesions. Adrenals/Urinary Tract: Left adrenal gland lesions suspicious for metastasis. The right adrenal gland is normal. Complex enhancing left upper  pole renal lesion measures 5.9 x 4.2 cm and is consistent with renal cell carcinoma. No renal vein invasion. Stomach/Bowel: The stomach, duodenum, small bowel and colon are grossly normal. No inflammatory changes, mass lesions or obstructive findings. Vascular/Lymphatic: Bulky knee upper abdominal lymphadenopathy with a confluent mass in the portal region, gastrohepatic ligament and celiac axis. This measures 10.2 x 6.5 cm on image number 55. There is compression of the extrahepatic portal vein. There is also bulky retroperitoneal lymphadenopathy bilaterally. Advanced atherosclerotic calcifications involving the aorta and branch vessels. No aneurysm or dissection. Reproductive: The uterus and ovaries are unremarkable. Other: No pelvic mass or adenopathy. No free pelvic fluid collections. Moderate distention of the bladder. No inguinal mass or adenopathy. Musculoskeletal: No definite lytic or sclerotic bone lesions to suggest metastasis. IMPRESSION: 1. Bulky mediastinal and right hilar lymphadenopathy. Imminent SVC occlusion. 2. Large right upper lobe lung mass and surrounding interstitial thickening suggesting interstitial spread of tumor. 3. Diffuse hepatic metastatic disease and bulky upper abdominal and retroperitoneal lymphadenopathy. 4. Complex  enhancing partially calcified left upper pole renal mass most likely renal cell carcinoma. 5. Suspect sternal metastatic disease. I do not see any other obvious bone lesions. 6. Advanced atherosclerotic calcifications involving the thoracic and abdominal aorta and branch vessels. Electronically Signed   By: Marijo Sanes M.D.   On: 10/03/2016 16:26   Mr Lumbar Spine Wo Contrast  Result Date: 10/04/2016 CLINICAL DATA:  Acute on chronic low back pain with a recent fall. Recently diagnosed metastatic disease. L4 compression fracture. EXAM: MRI LUMBAR SPINE WITHOUT CONTRAST TECHNIQUE: Multiplanar, multisequence MR imaging of the lumbar spine was performed. No intravenous contrast was administered. COMPARISON:  CT abdomen and pelvis 10/03/2016 FINDINGS: Segmentation:  Standard. Alignment:  Slight anterolisthesis of L4 on L5 due to facet disease. Vertebrae: L4 superior endplate fracture with 11% height loss centrally. No substantial marrow edema is identified directly subjacent to the compressed portion of the endplate. There is some hyperintense STIR signal more posteriorly along the superior endplate and in the right aspect of the vertebral body, however this is similar in appearance to multiple other levels and is felt to reflect an underlying osseous lesion rather than edema from an acute fracture. Bone marrow signal is diffusely abnormal throughout the visualized lower thoracic and lumbar spine as well as sacrum with replacement of the majority of normal fatty marrow signal and numerous STIR hyperintense lesions throughout. No gross paraspinal or epidural tumor is identified on this unenhanced study. Conus medullaris: Extends to the L1 level and appears normal. Paraspinal and other soft tissues: Liver metastases and retroperitoneal lymphadenopathy as described on recent CT. Disc levels: T12-L1:  Minimal disc bulging without stenosis. L1-2:  Negative. L2-3:  Negative. L3-4: Disc bulging, shallow central disc  protrusion, and ligamentum flavum thickening result in mild right greater than left lateral recess stenosis, mild to moderate spinal stenosis, and no significant neural foraminal stenosis. L4-5: Disc bulging, shallow left paracentral disc protrusion, ligamentum flavum thickening, and facet arthrosis result in moderate spinal stenosis, mild right and moderate left lateral recess stenosis, and mild left neural foraminal stenosis. There are small facet joint effusions, right larger than left. L5-S1: Right paracentral disc protrusion results in minimal right lateral recess narrowing and slight posterior deflection of the adjacent right S1 nerve root. No spinal or neural foraminal stenosis. Mild facet arthrosis. IMPRESSION: 1. Diffuse osseous metastatic disease. 2. L4 superior endplate fracture with mild height loss favored to be chronic. 3. Moderate spinal stenosis and left greater than right  lateral recess stenosis at L4-5. 4. Mild-to-moderate spinal stenosis at L3-4. Electronically Signed   By: Logan Bores M.D.   On: 10/04/2016 12:04   Ct Abdomen Pelvis W Contrast  Result Date: 10/03/2016 CLINICAL DATA:  Metastatic breast cancer EXAM: CT CHEST, ABDOMEN, AND PELVIS WITH CONTRAST TECHNIQUE: Multidetector CT imaging of the chest, abdomen and pelvis was performed following the standard protocol during bolus administration of intravenous contrast. CONTRAST:  147m ISOVUE-300 IOPAMIDOL (ISOVUE-300) INJECTION 61% COMPARISON:  CT scan 10/01/2016 FINDINGS: CT CHEST FINDINGS Chest wall: Status post left mastectomy. No right-sided breast masses are identified. No axillary lymphadenopathy. 8 mm supraclavicular lymph node noted on image 2. There also enlarge subclavicular lymph nodes on the right side. The thyroid gland is grossly normal. Cardiovascular: The heart is within normal limits in size and stable. All stable dense atherosclerotic calcifications involving the aorta and branch vessels. No aneurysm or dissection.  Three-vessel coronary artery calcifications are noted. Mediastinum/Nodes: There is extensive mediastinal and right hilar tumor. Large nodal masses throughout the mediastinum. The right paratracheal mass measures 4.8 x 3.9 cm on image 17. There is severe compression of the SVC but it is not completely occluded. Right hilar/ mediastinal mass on image number 25 measures 7.8 x 6.7 cm. Subcarinal nodal mass on image number 28 measures 34 x 30 mm. Lungs/Pleura: Lobulated right upper lobe lung mass on image number 65 measures 38 x 32 mm. Marked interstitial thickening on the right could suggest interstitial spread of tumor. The left lung is relatively clear. No acute pulmonary findings or worrisome pulmonary lesions. There are small bilateral pleural effusions. Musculoskeletal: Mixed lytic and sclerotic sternal lesions are suspicious for metastatic disease. No other obvious bone lesions. CT ABDOMEN PELVIS FINDINGS Hepatobiliary: Index lesion in the right lobe on image number 53 measures 8.6 x 4.7 cm. Index lesion in the lateral segment of the left hepatic lobe on image number 52 measures 5.4 x 4.1 cm. The gallbladder is grossly normal. A stable calcified gallstone is noted. Small amount of pericholecystic fluid. Pancreas: No mass, inflammation or ductal dilatation. Spleen: Normal size.  No focal lesions. Adrenals/Urinary Tract: Left adrenal gland lesions suspicious for metastasis. The right adrenal gland is normal. Complex enhancing left upper pole renal lesion measures 5.9 x 4.2 cm and is consistent with renal cell carcinoma. No renal vein invasion. Stomach/Bowel: The stomach, duodenum, small bowel and colon are grossly normal. No inflammatory changes, mass lesions or obstructive findings. Vascular/Lymphatic: Bulky knee upper abdominal lymphadenopathy with a confluent mass in the portal region, gastrohepatic ligament and celiac axis. This measures 10.2 x 6.5 cm on image number 55. There is compression of the extrahepatic  portal vein. There is also bulky retroperitoneal lymphadenopathy bilaterally. Advanced atherosclerotic calcifications involving the aorta and branch vessels. No aneurysm or dissection. Reproductive: The uterus and ovaries are unremarkable. Other: No pelvic mass or adenopathy. No free pelvic fluid collections. Moderate distention of the bladder. No inguinal mass or adenopathy. Musculoskeletal: No definite lytic or sclerotic bone lesions to suggest metastasis. IMPRESSION: 1. Bulky mediastinal and right hilar lymphadenopathy. Imminent SVC occlusion. 2. Large right upper lobe lung mass and surrounding interstitial thickening suggesting interstitial spread of tumor. 3. Diffuse hepatic metastatic disease and bulky upper abdominal and retroperitoneal lymphadenopathy. 4. Complex enhancing partially calcified left upper pole renal mass most likely renal cell carcinoma. 5. Suspect sternal metastatic disease. I do not see any other obvious bone lesions. 6. Advanced atherosclerotic calcifications involving the thoracic and abdominal aorta and branch vessels. Electronically Signed  By: Marijo Sanes M.D.   On: 10/03/2016 16:26   US Biopsy  Result Date: 10/05/2016 INDICATION: 61 year old female with evidence of metastatic disease in the chest, abdomen and bones. Tissue diagnosis is needed. EXAM: ULTRASOUND-GUIDED LIVER LESION BIOPSY MEDICATIONS: None. ANESTHESIA/SEDATION: No sedation was used. The patient's vital signs were monitored continuously by radiology nursing throughout the procedure under my direct supervision. FLUOROSCOPY TIME:  None COMPLICATIONS: None immediate. PROCEDURE: Informed written consent was obtained from the patient after a thorough discussion of the procedural risks, benefits and alternatives. All questions were addressed. A timeout was performed prior to the initiation of the procedure. Liver was evaluated with ultrasound. There are subtle lesions scattered throughout the liver. A lesion in the right  hepatic lobe was selected for biopsy. Abdomen was prepped and draped in sterile fashion. Skin and soft tissues were anesthetized with 1% lidocaine. Using ultrasound guidance, 17 gauge needle was directed into the right hepatic lobe lesion. A total of 4 core biopsies were obtained with an 18 gauge core device. Three of the core samples were adequate and placed in formalin. 17 gauge needle was removed without complication. Bandage placed over the puncture site. FINDINGS: Subtle lesions throughout the liver. Needle position confirmed within a right hepatic lesion. No significant bleeding or hematoma formation following the core biopsies. IMPRESSION: Successful ultrasound-guided core biopsy of a right hepatic lesion. Electronically Signed   By: Markus Daft M.D.   On: 10/05/2016 17:27   Dg Chest Port 1 View  Result Date: 10/09/2016 CLINICAL DATA:  Cough EXAM: PORTABLE CHEST 1 VIEW COMPARISON:  10/06/2016, CT 10/03/2016 FINDINGS: Interval increase in pleural and parenchymal disease of the right thorax with complete white out of the right thorax. Suspect that there is mild cardiomegaly. Atelectasis or infiltrate at the medial left lung base. Atherosclerosis. IMPRESSION: 1. Progression of pleural and parenchymal disease in the right thorax with complete white out of the right thorax. 2. Medial left lung base atelectasis or infiltrate Electronically Signed   By: Donavan Foil M.D.   On: 10/09/2016 01:44   Ct Renal Stone Study  Result Date: 10/01/2016 CLINICAL DATA:  Flank pain and hematuria. EXAM: CT ABDOMEN AND PELVIS WITHOUT CONTRAST TECHNIQUE: Multidetector CT imaging of the abdomen and pelvis was performed following the standard protocol without IV contrast. COMPARISON:  None. FINDINGS: Lower chest:  No contributory findings. Hepatobiliary: The liver is nearly replaced by innumerable masses.Cholelithiasis. No evidence of acute cholecystitis. Pancreas: The head and neck is indistinguishable from bulky  retroperitoneal adenopathy. Spleen: Unremarkable. Adrenals/Urinary Tract: Negative adrenals. 4 cm mass exophytic from the upper pole left kidney. Small renal calculi may be present. No hydronephrosis or ureteral calculus (ovarian vein phlebolith noted on the right). 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 Asymmetric bladder wall thickening to the left. Stomach/Bowel:  No obstruction. No inflammatory changes. Vascular/Lymphatic: Bulky adenopathy in the upper abdomen with confluent nodal mass extending from the gastrohepatic ligament to the hepatic hilum and measuring up to 10 cm. Retroperitoneal adenopathy continues inferiorly to the level of the aortic bifurcation. Atherosclerotic calcification Reproductive:No pathologic findings. Other: No ascites or pneumoperitoneum. Musculoskeletal: Heterogeneous density of marrow without discrete destructive lesion. There is a L4 superior endplate with chronic appearance based on overlying vacuum phenomenon. Height loss is mild. Bone metastases may be present. IMPRESSION: 1. Extensive metastatic disease in the liver and retroperitoneal lymph nodes. Patient has history of breast cancer and there is a 4 cm left renal mass compatible with renal cell carcinoma. 2. Asymmetric left bladder wall thickening.  3. L4 superior endplate fracture, with nonacute appearance. This may be pathologic. Electronically Signed   By: Monte Fantasia M.D.   On: 10/01/2016 17:57     Assessment and plan- Patient is a 61 y.o. female with a h/o newly diagnosed extensive stage small cell lung cancer  1. Patient will be going to hospice home today. Given her poor performance status, she was not deemed to be a candidate for chemotherapy. Patients daughter is grateful for the care she has received so far.    Visit Diagnosis 1. Weakness   2. Urinary tract infection with hematuria, site unspecified   3. Metastatic cancer ([place])   4. Liver lesion   5. Compression fracture of L4 lumbar vertebra ([facility])   6.  Malignancy ([place])   7. Hypoxia   8. Cough      Dr. Randa Evens, MD, MPH [place] [facility] Pager- [phone] 10/10/2016 6:00 PM

## 2016-10-10 NOTE — Discharge Instructions (Signed)
Hospice °Hospice is a service that is designed to provide people who are terminally ill and their families with medical, spiritual, and psychological support. Its aim is to improve your quality of life by keeping you as alert and comfortable as possible. Hospice is performed by a team of health care professionals and volunteers who: °· Help keep you comfortable. Hospice can be provided in your home or in a homelike setting. The hospice staff works with your family and friends to help meet your needs. You will enjoy the support of loved ones by receiving much of your basic care from family and friends. °· Provide pain relief and manage your symptoms. The staff supply all necessary medicines and equipment. °· Provide companionship when you are alone. °· Allow you and your family to rest. They may do light housekeeping, prepare meals, and run errands. °· Provide counseling. They will make sure your emotional, spiritual, and social needs and those of your family are being met. °· Provide spiritual care. Spiritual care is individualized to meet your needs and your family's needs. It may involve helping you look at what death means to you, say goodbye, or perform a specific religious ceremony or ritual. °Hospice teams often include: °· A nurse. °· A doctor. °· Social workers. °· Religious leaders (such as a chaplain). °· Trained volunteers. °WHEN SHOULD HOSPICE CARE BEGIN? °Most people who use hospice are believed to have fewer than 6 months to live. Your family and health care providers can help you decide when hospice services should begin. If your condition improves, you may discontinue the program. °WHAT SHOULD I CONSIDER BEFORE SELECTING A PROGRAM? °Most hospice programs are run by nonprofit, independent organizations. Some are affiliated with hospitals, nursing homes, or home health care agencies. Hospice programs can take place in the home or at a hospice center, hospital, or skilled nursing facility. When choosing  a hospice program, ask the following questions: °· What services are available to me? °· What services are offered to my loved ones? °· How involved are my loved ones? °· How involved is my health care provider? °· Who makes up the hospice care team? How are they trained or screened? °· How will my pain and symptoms be managed? °· If my circumstances change, can the services be provided in a different setting, such as my home or in the hospital? °· Is the program reviewed and licensed by the state or certified in some other way? °WHERE CAN I LEARN MORE ABOUT HOSPICE? °You can learn about existing hospice programs in your area from your health care providers. You can also read more about hospice online. The websites of the following organizations contain helpful information: °· The National Hospice and Palliative Care Organization (NHPCO). °· The Hospice Association of America (HAA). °· The Hospice Education Institute. °· The American Cancer Society (ACS). °· Hospice Net. °This information is not intended to replace advice given to you by your health care provider. Make sure you discuss any questions you have with your health care provider. °Document Released: 12/03/2003 Document Revised: 08/21/2013 Document Reviewed: 06/26/2013 °Elsevier Interactive Patient Education © 2017 Elsevier Inc. ° °

## 2016-10-10 NOTE — Progress Notes (Signed)
[place] [facility] [place] NAME: Megan Cisneros    MR#:  [id number]  DATE OF BIRTH:  20-Mar-1956  SUBJECTIVE:  CHIEF COMPLAINT:   Chief Complaint  Patient presents with  . Weakness  in some pain, all family at bedside. Thankful for her care. REVIEW OF SYSTEMS:  CONSTITUTIONAL: No fever,positive for fatigue or weakness.  EYES: No blurred or double vision.  EARS, NOSE, AND THROAT: No tinnitus or ear pain.  RESPIRATORY: No cough, shortness of breath, wheezing or hemoptysis.  CARDIOVASCULAR: No chest pain, orthopnea, edema.  GASTROINTESTINAL: No nausea, vomiting, diarrhea or abdominal pain.  GENITOURINARY: No dysuria, hematuria.  ENDOCRINE: No polyuria, nocturia,  HEMATOLOGY: No anemia, easy bruising or bleeding SKIN: No rash or lesion. MUSCULOSKELETAL: No joint pain or arthritis.   NEUROLOGIC: No tingling, numbness, weakness.  PSYCHIATRY: No anxiety or depression.   ROS  DRUG ALLERGIES:  No Known Allergies  VITALS:  Blood pressure 122/65, pulse 78, temperature 98.4 F (36.9 C), resp. rate 20, height 5' (1.524 m), weight 85.5 kg (188 lb 9.6 oz), SpO2 94 %.  PHYSICAL EXAMINATION:  GENERAL:  61 y.o.-year-old patient lying in the bed with no acute distress.  EYES: Pupils equal, round, reactive to light and accommodation. No scleral icterus. Extraocular muscles intact.  HEENT: Head atraumatic, normocephalic. Oropharynx and nasopharynx clear. Oral mucosa dry. NECK:  Supple, no jugular venous distention. No thyroid enlargement, no tenderness.  LUNGS: Normal breath sounds bilaterally, no wheezing, rales,rhonchi or crepitation. No use of accessory muscles of respiration.  CARDIOVASCULAR: S1, S2 fast and regular. No murmurs, rubs, or gallops.  ABDOMEN: Soft, nontender, nondistended. Bowel sounds present. No organomegaly or mass.  EXTREMITIES: No pedal edema, cyanosis, or clubbing.  NEUROLOGIC: Cranial nerves II through XII are intact. Muscle strength  4/5 in all extremities. Sensation intact. Gait not checked.  PSYCHIATRIC: The patient is Lethargic. Seem to be actively dying SKIN: No obvious rash, lesion, or ulcer.   Physical Exam LABORATORY PANEL:   CBC  Recent Labs Lab 10/09/16 0623  WBC 9.3  HGB 10.4*  HCT 31.1*  PLT 233   ------------------------------------------------------------------------------------------------------------------  Chemistries   Recent Labs Lab 10/09/16 0623  NA 137  K 3.9  CL 105  CO2 25  GLUCOSE 65  BUN 47*  CREATININE 1.39*  CALCIUM 8.3*  MG 1.7   ------------------------------------------------------------------------------------------------------------------  Cardiac Enzymes No results for input(s): TROPONINI in the last 168 hours. ------------------------------------------------------------------------------------------------------------------  RADIOLOGY:  Dg Chest Port 1 View  Result Date: 10/09/2016 CLINICAL DATA:  Cough EXAM: PORTABLE CHEST 1 VIEW COMPARISON:  10/06/2016, CT 10/03/2016 FINDINGS: Interval increase in pleural and parenchymal disease of the right thorax with complete white out of the right thorax. Suspect that there is mild cardiomegaly. Atelectasis or infiltrate at the medial left lung base. Atherosclerosis. IMPRESSION: 1. Progression of pleural and parenchymal disease in the right thorax with complete white out of the right thorax. 2. Medial left lung base atelectasis or infiltrate Electronically Signed   By: Donavan Foil M.D.   On: 10/09/2016 01:44   ASSESSMENT AND PLAN:  61 year old female patient currently admitted to the hospital for generalized weakness/UTI. Imaging shows concerns for metastatic - biopsy positive for small cell cancer  #1. Metastatic Small cell lung cancer #2 Staph Aureus UTI #3 Afib/flutter with RVR/hypotension #4. L4 superior endplate fracture, with nonacute appearance.  #5 Ch diastolic CHF #6 Diabetes #7 Hypotension #8 ARF -  ATN  (acute tubular necrosis)  * Hypomagnesemia * Pressure injury of  skin   Waiting for Hospice Home bed   All the records are reviewed and case discussed with Care Management/Social Worker. Management plans discussed with the patient, family and they are in agreement.  CODE STATUS: DNR, very poor prognosis   TOTAL TIME TAKING CARE OF THIS PATIENT: 25 minutes.   POSSIBLE D/C IN 1-2 DAYS, DEPENDING ON CLINICAL CONDITION. And Hospice Home beds availability   Max Sane M.D on 10/10/2016   Between 7am to 6pm - Pager - [phone]  After 6pm go to [email or web address] - password [email or web address] [facility]  Office  [phone]  CC: Primary care physician; Donnie Coffin, MD  Note: This dictation was prepared with Dragon dictation along with smaller phrase technology. Any transcriptional errors that result from this process are unintentional.

## 2016-10-10 NOTE — Plan of Care (Signed)
Pt transferred to the floor at 9:15 am today from 2A as comfort care.  A bed became available at [facility] and patient is transferring there via EMS.  Patient received 2 doses of 2 mg morphine and 1 mg ativan today.  Family pleased that she appears to be resting comfortably.  Called report to Magda Bernheim at [facility] and EMS called for transport.  Patient transferred on 5L O2 and IV intact at [facility] request. Patient's family and daughter Andris Flurry have been at bedside today and very supportive of each other and patient.

## 2016-10-10 NOTE — Discharge Summary (Signed)
[place] at [place] NAME: Megan Cisneros    MR#:  [id number]  DATE OF BIRTH:  [birth year]  DATE OF ADMISSION:  10/01/2016   ADMITTING PHYSICIAN: Epifanio Lesches, MD  DATE OF DISCHARGE: 10/10/2016  PRIMARY CARE PHYSICIAN: Donnie Coffin, MD   ADMISSION DIAGNOSIS:  Metastatic cancer ([place]) [C79.9] Weakness [R53.1] Urinary tract infection with hematuria, site unspecified [N39.0, R31.9] DISCHARGE DIAGNOSIS:  Principal Problem:   Metastatic lung cancer (metastasis from lung to other site) [facility]) Active Problems:   Hypomagnesemia   Pressure injury of skin   Liver lesion   Palliative care by specialist   Goals of care, counseling/discussion   DNR (do not resuscitate) discussion   Metastatic cancer ([place])   Atrial fibrillation with RVR ([place])   Hypotension   Malignancy ([place])   DNR (do not resuscitate)   ATN (acute tubular necrosis) ([place])   Cough   Weakness  SECONDARY DIAGNOSIS:   Past Medical History:  Diagnosis Date  . Atrial flutter ([place])    a. Dx 11/2015 -->CHA2DS2VASc = 7-->Eliquis '5mg'$  BID.  Marland Kitchen Breast cancer ([place])   . Chronic diastolic CHF (congestive heart failure) ([place])    a. 11/2015 Echo: EF 60-65%, no rwma, mild MR, mildly dil LA, nl RV fxn.  . Chronic Dyspnea on Exertion    a. 2004 Stress test prior to LE bypass->reportedly nl.  . Diabetes mellitus without complication ([place])    a. Dx as borderline diabetic in the 80's-->progressed over the years.  . Essential hypertension   . Lower extremity edema   . Morbid obesity ([place])   . PVC's (premature ventricular contractions)   . PVD (peripheral vascular disease) ([place])    a. 2004 s/p RLE bypass - complicated by recurrent infection.  . Stroke [facility])    a. TIA x 2;  b. Stroke in 2007 in [place].   HOSPITAL COURSE:  61 year old female patient currently admitted to the hospital for generalized weakness/UTI. Imaging shows concerns for metastatic - biopsy positive for small cell  cancer  #1. Metastatic Small cell lung cancer #2 Staph Aureus UTI #3 Afib/flutter with RVR/hypotension #4. L4 superior endplate fracture, with nonacute appearance.  #5 Ch diastolic CHF #6 Diabetes #7 Hypotension #8 ARF -ATN (acute tubular necrosis)  9 Hypomagnesemia 10Pressure injury of skin  Patient and family agreeable for comfort care and Hospice. DISCHARGE CONDITIONS:  critical CONSULTS OBTAINED:  Treatment Team:  Cammie Sickle, MD Rusty Aus, MD Earnestine Leys, MD Hessie Knows, MD Nelva Bush, MD Wellington Hampshire, MD Lavonia Dana, MD Minna Merritts, MD DRUG ALLERGIES:  No Known Allergies DISCHARGE MEDICATIONS:   Allergies as of 10/10/2016   No Known Allergies     Medication List    STOP taking these medications   apixaban 5 MG Tabs tablet Commonly known as:  ELIQUIS   atorvastatin 80 MG tablet Commonly known as:  LIPITOR   diltiazem 120 MG 24 hr capsule Commonly known as:  CARDIZEM CD   furosemide 40 MG tablet Commonly known as:  LASIX   gabapentin 100 MG capsule Commonly known as:  NEURONTIN   insulin detemir 100 UNIT/ML injection Commonly known as:  LEVEMIR   lisinopril 10 MG tablet Commonly known as:  PRINIVIL,ZESTRIL   metFORMIN 500 MG tablet Commonly known as:  GLUCOPHAGE   metoprolol tartrate 25 MG tablet Commonly known as:  LOPRESSOR   multivitamin with minerals Tabs tablet   omeprazole 20 MG capsule Commonly known as:  PRILOSEC  oxybutynin 5 MG tablet Commonly known as:  DITROPAN   traZODone 100 MG tablet Commonly known as:  DESYREL   VENTOLIN HFA 108 (90 Base) MCG/ACT inhaler Generic drug:  albuterol     TAKE these medications   LORazepam 2 MG/ML injection Commonly known as:  ATIVAN Inject 0.5 mLs (1 mg total) into the vein every 4 (four) hours as needed for anxiety.   morphine 2 MG/ML injection Inject 1 mL (2 mg total) into the vein every 2 (two) hours as needed.      DISCHARGE INSTRUCTIONS:     DIET:  Pleasure feeds DISCHARGE CONDITION:  Critical ACTIVITY:  Activity as tolerated OXYGEN:  Home Oxygen: Yes.    Oxygen Delivery: 2 liters/min via Patient connected to nasal cannula oxygen DISCHARGE LOCATION:  Hospice Home   If you experience worsening of your admission symptoms, develop shortness of breath, life threatening emergency, suicidal or homicidal thoughts you must seek medical attention immediately by calling 911 or calling your MD immediately  if symptoms less severe.  You Must read complete instructions/literature along with all the possible adverse reactions/side effects for all the Medicines you take and that have been prescribed to you. Take any new Medicines after you have completely understood and accpet all the possible adverse reactions/side effects.   Please note  You were cared for by a hospitalist during your hospital stay. If you have any questions about your discharge medications or the care you received while you were in the hospital after you are discharged, you can call the unit and asked to speak with the hospitalist on call if the hospitalist that took care of you is not available. Once you are discharged, your primary care physician will handle any further medical issues. Please note that NO REFILLS for any discharge medications will be authorized once you are discharged, as it is imperative that you return to your primary care physician (or establish a relationship with a primary care physician if you do not have one) for your aftercare needs so that they can reassess your need for medications and monitor your lab values.    On the day of Discharge:  VITAL SIGNS:  Blood pressure 122/65, pulse 78, temperature 98.4 F (36.9 C), resp. rate 20, height 5' (1.524 m), weight 85.5 kg (188 lb 9.6 oz), SpO2 94 %. PHYSICAL EXAMINATION:  GENERAL:  61 y.o.-year-old patient lying in the bed with no acute distress.  EYES: Pupils equal, round, reactive to light and  accommodation. No scleral icterus. Extraocular muscles intact.  HEENT: Head atraumatic, normocephalic. Oropharynx and nasopharynx clear. Oral mucosa dry. NECK:  Supple, no jugular venous distention. No thyroid enlargement, no tenderness.  LUNGS: Normal breath sounds bilaterally, no wheezing, rales,rhonchi or crepitation. No use of accessory muscles of respiration.  CARDIOVASCULAR: S1, S2 fast and regular. No murmurs, rubs, or gallops.  ABDOMEN: Soft, nontender, nondistended. Bowel sounds present. No organomegaly or mass.  EXTREMITIES: No pedal edema, cyanosis, or clubbing.  NEUROLOGIC: Cranial nerves II through XII are intact. Muscle strength 4/5 in all extremities. Sensation intact. Gait not checked.  PSYCHIATRIC: The patient is Lethargic. Seem to be actively dying SKIN: No obvious rash, lesion, or ulcer.  DATA REVIEW:   CBC  Recent Labs Lab 10/09/16 0623  WBC 9.3  HGB 10.4*  HCT 31.1*  PLT 233    Chemistries   Recent Labs Lab 10/09/16 0623  NA 137  K 3.9  CL 105  CO2 25  GLUCOSE 65  BUN 47*  CREATININE  1.39*  CALCIUM 8.3*  MG 1.7     Microbiology Results  Urine c/s grew >10K staph aureus  Very poor prognosis. Likely will die < 2 weeks  Management plans discussed with the patient, family and they are in agreement.  CODE STATUS: DNR   TOTAL TIME TAKING CARE OF THIS PATIENT: 45 minutes.    Max Sane M.D on 10/10/2016 at 2:33 PM  Between 7am to 6pm - Pager - [phone]  After 6pm go to [email or web address] - Proofreader  [facility]  Office  [phone]  CC: Primary care physician; Donnie Coffin, MD   Note: This dictation was prepared with Dragon dictation along with smaller phrase technology. Any transcriptional errors that result from this process are unintentional.

## 2016-10-11 ENCOUNTER — Ambulatory Visit: Payer: Medicare Other

## 2016-10-12 ENCOUNTER — Ambulatory Visit: Payer: Medicare Other

## 2016-10-13 ENCOUNTER — Ambulatory Visit: Payer: Medicare Other

## 2016-10-14 ENCOUNTER — Ambulatory Visit: Payer: Medicare Other

## 2016-10-15 ENCOUNTER — Ambulatory Visit: Payer: Medicare Other

## 2016-10-18 ENCOUNTER — Ambulatory Visit: Payer: Medicare Other

## 2016-10-19 ENCOUNTER — Ambulatory Visit: Payer: Medicare Other

## 2016-10-20 ENCOUNTER — Ambulatory Visit: Payer: Medicare Other

## 2016-10-21 ENCOUNTER — Ambulatory Visit: Payer: Medicare Other

## 2016-10-22 ENCOUNTER — Ambulatory Visit: Payer: Medicare Other

## 2016-10-25 ENCOUNTER — Ambulatory Visit: Payer: Medicare Other

## 2016-10-28 DEATH — deceased

## 2018-05-27 IMAGING — CR DG CHEST 1V
1 series · 1 of 1 positions shown · non-contrast
Comparison: Chest radiographs performed 08/05/2016 and 12/11/2015

CLINICAL DATA: Acute onset of generalized weakness. Initial
encounter.

EXAM:
CHEST 1 VIEW

[chest ap]
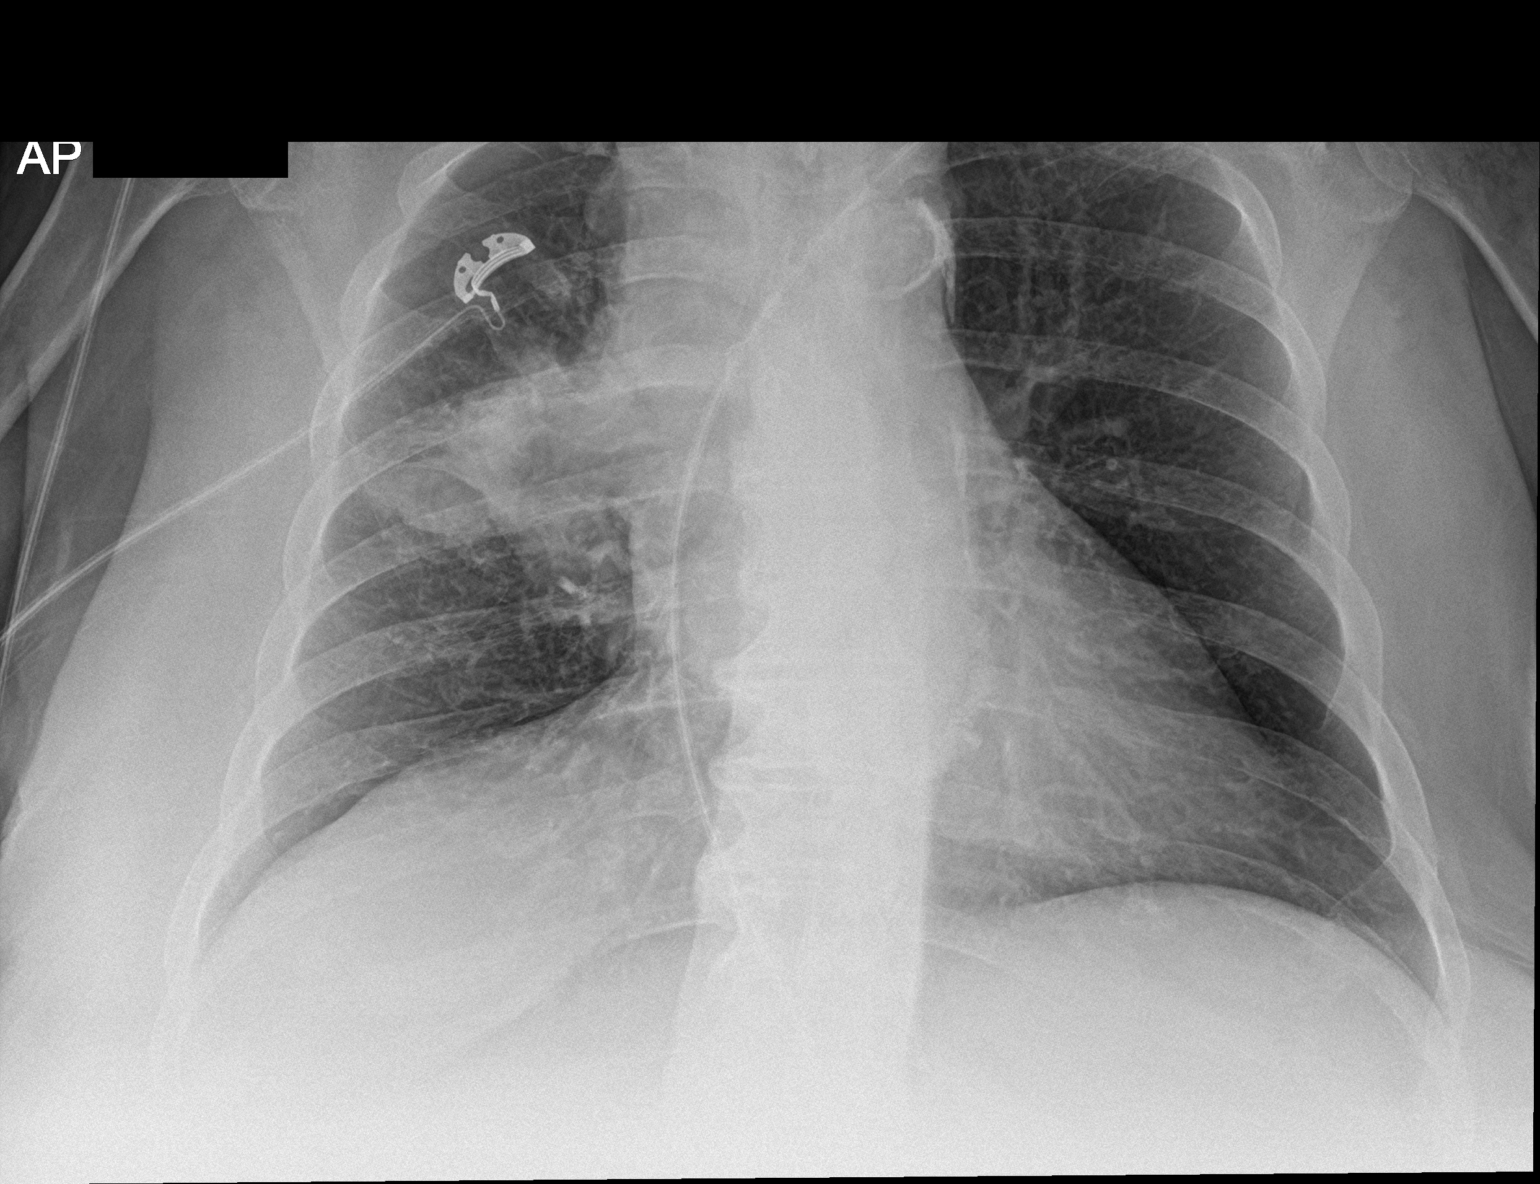

[1 of 1 positions shown; findings below may reference images not displayed]

FINDINGS: Enlarging dense right hilar opacity is suspicious for malignancy.
The left lung appears relatively clear. No pleural effusion or
pneumothorax is seen. There is elevation of the right hemidiaphragm.

The cardiomediastinal silhouette is normal in size. No acute osseous
abnormalities are identified.
IMPRESSION: Enlarging dense right hilar opacity is suspicious for malignancy. CT
of the chest with contrast is recommended for further evaluation.

## 2018-05-27 IMAGING — CT CT HEAD W/O CM
3 series · 15 of 47 positions shown, 18 images · non-contrast
Comparison: None.

CLINICAL DATA: Weakness for 2 weeks

EXAM:
CT HEAD WITHOUT CONTRAST
TECHNIQUE: Contiguous axial images were obtained from the base of the skull
through the vertex without intravenous contrast.

[Series 2: head wo · axial · 0.40mm/px · z∈[-119,+11]mm · 9 of 32 slices shown, 12 images]
[im 3/32  brain]
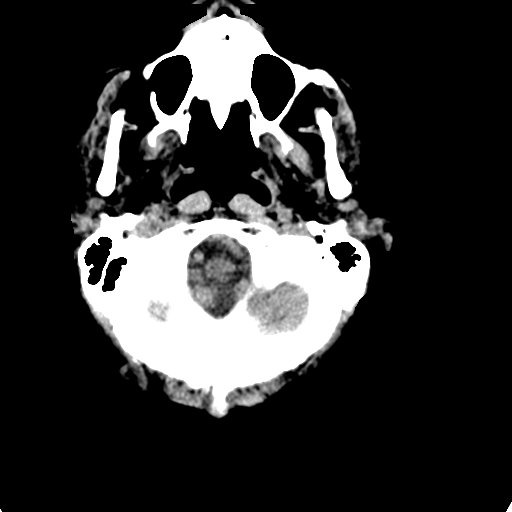
[im 3/32  bone]
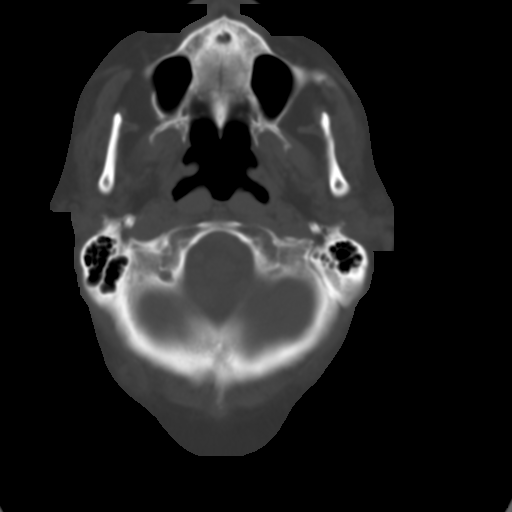
[im 6/32  brain]
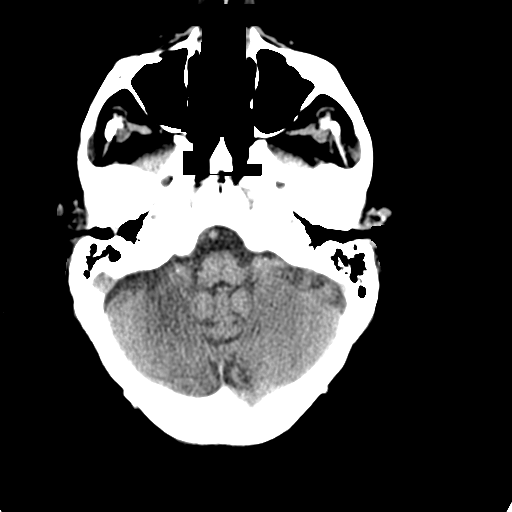
[im 9/32  brain]
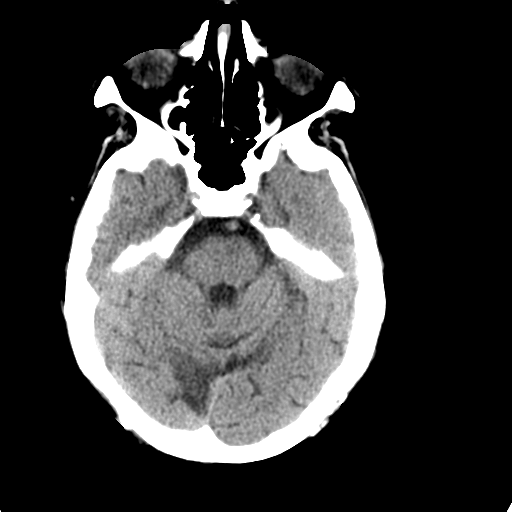
[im 12/32  brain]
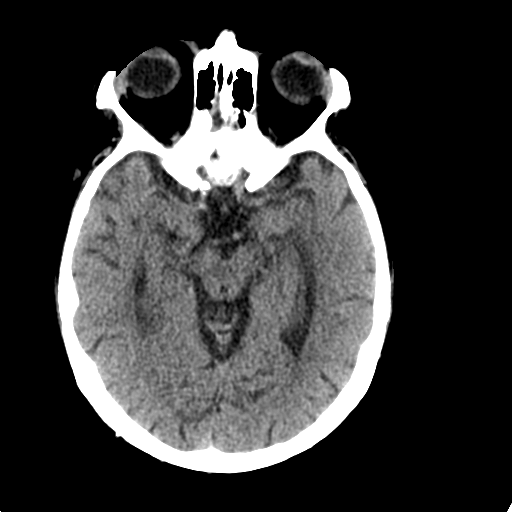
[im 17/32  brain]
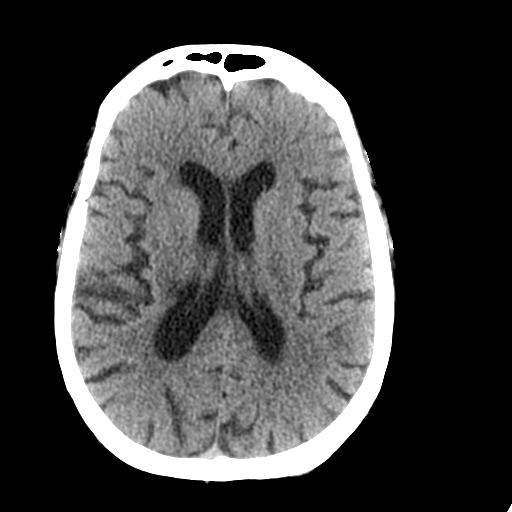
[im 17/32  bone]
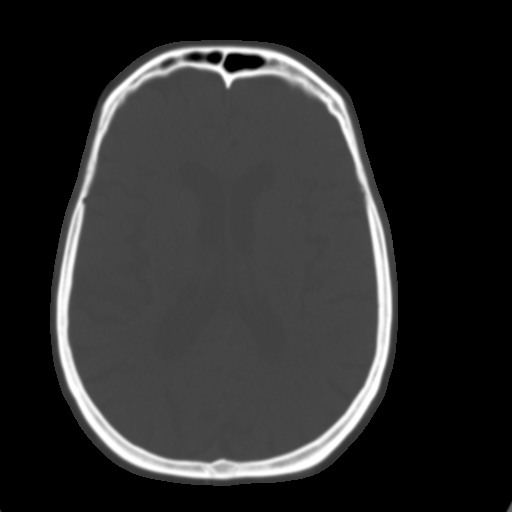
[im 20/32  brain]
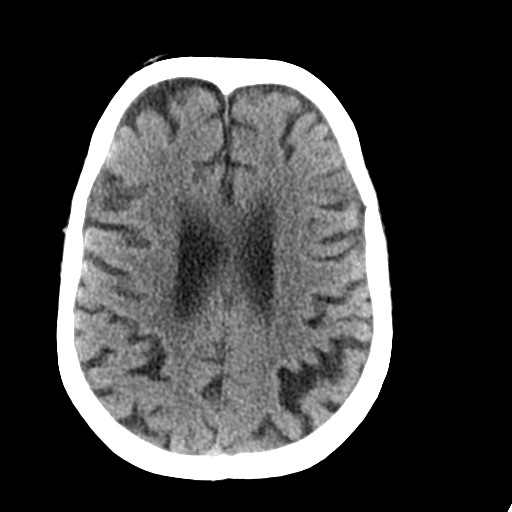
[im 23/32  brain]
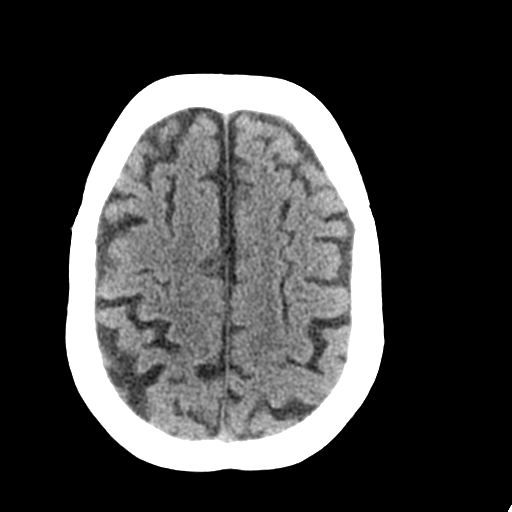
[im 26/32  brain]
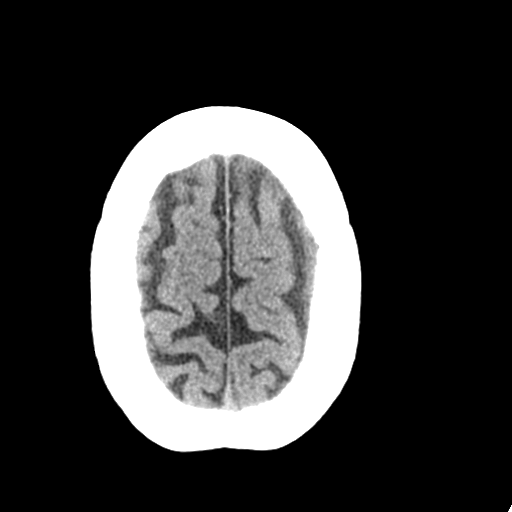
[im 29/32  brain]
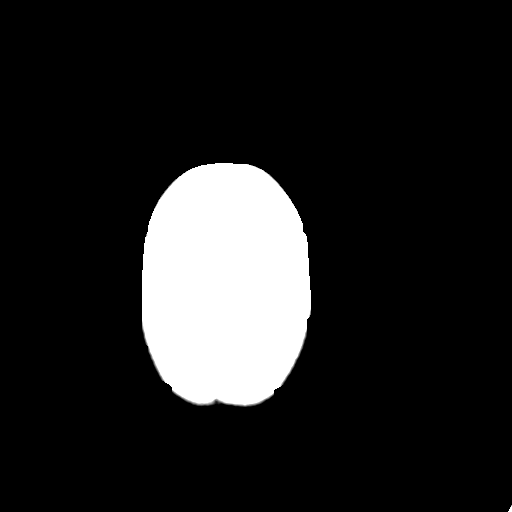
[im 29/32  bone]
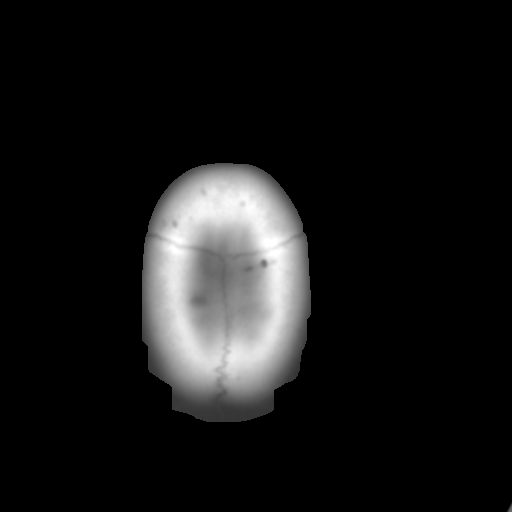

[Series 4: coronal soft tissue · coronal · 0.30mm/px · 3 of 69 slices shown]
[im 25/69  brain]
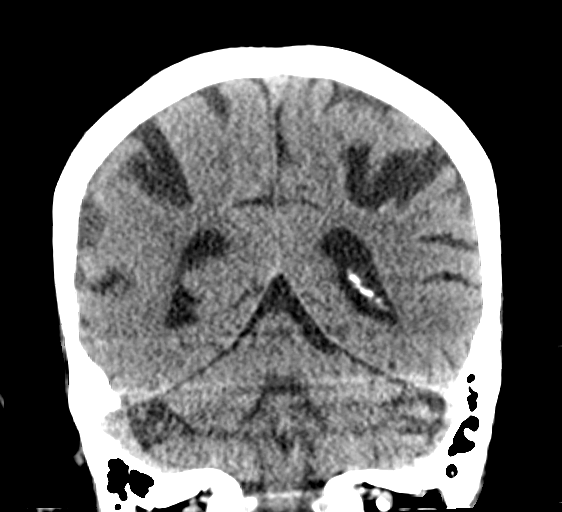
[im 31/69  brain]
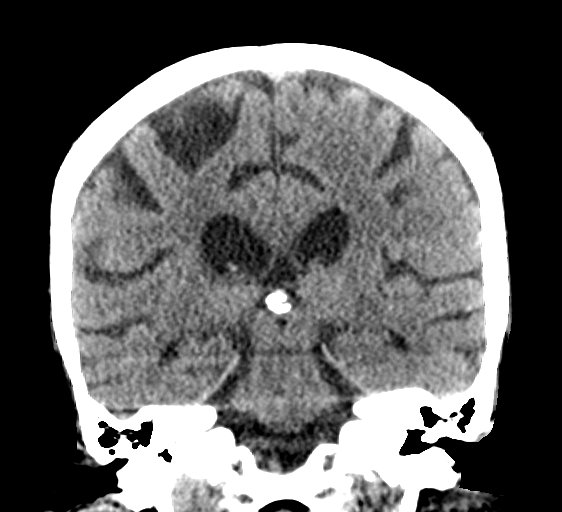
[im 38/69  brain]
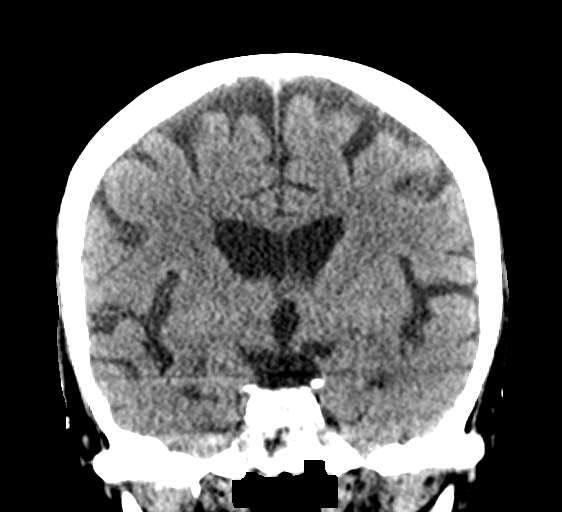

[Series 5: sagittal soft tissue · sagittal · 0.30mm/px · 3 of 50 slices shown]
[im 17/50  brain]
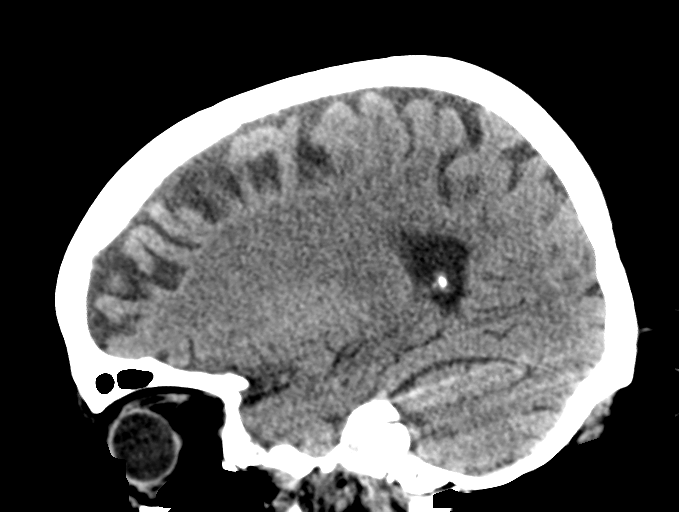
[im 25/50  brain]
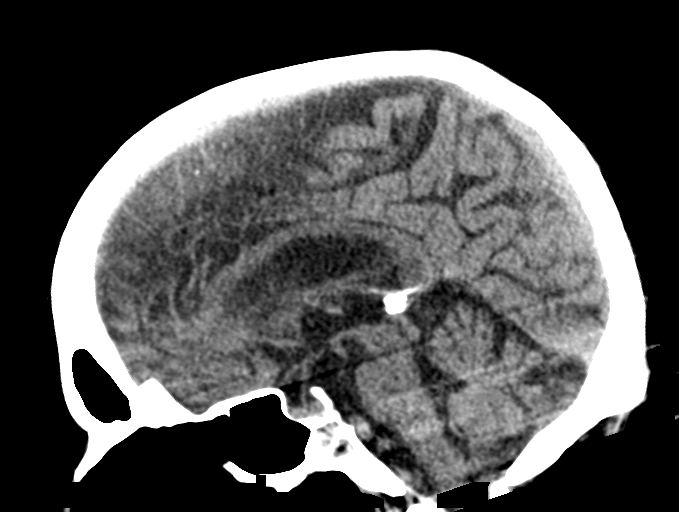
[im 33/50  brain]
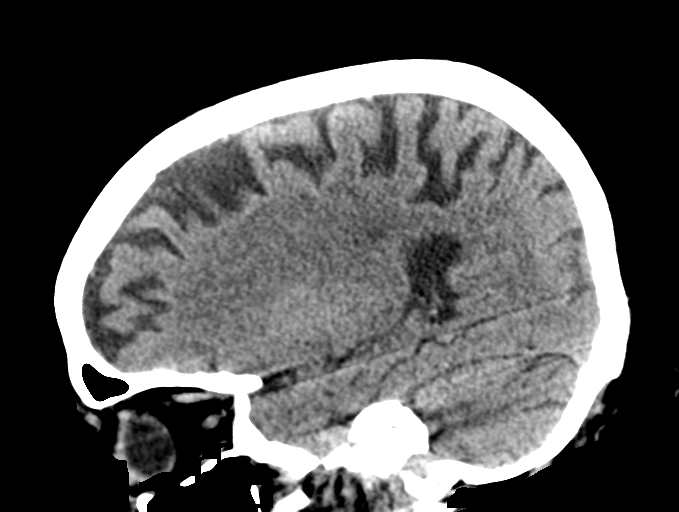

[15 of 47 positions shown; findings below may reference images not displayed]

FINDINGS: Brain: No evidence of acute infarction, hemorrhage, extra-axial
collection, ventriculomegaly, or mass effect. Generalized cerebral
atrophy. Periventricular white matter low attenuation likely
secondary to microangiopathy.

Vascular: Cerebrovascular atherosclerotic calcifications are noted.

Skull: Negative for fracture or focal lesion.

Sinuses/Orbits: Visualized portions of the orbits are unremarkable.
Visualized portions of the paranasal sinuses and mastoid air cells
are unremarkable.

Other: None.
IMPRESSION: 1. No acute intracranial pathology.
2. Chronic microvascular disease and cerebral atrophy.
